# Patient Record
Sex: Male | Born: 1950 | Race: White | Hispanic: No | Marital: Married | Smoking: Former smoker
Health system: Southern US, Community
[De-identification: ages and names within clinical notes are randomized; demographics above are authoritative.]

## PROBLEM LIST (undated history)

## (undated) DIAGNOSIS — I10 Essential (primary) hypertension: Secondary | ICD-10-CM

## (undated) DIAGNOSIS — I509 Heart failure, unspecified: Secondary | ICD-10-CM

## (undated) DIAGNOSIS — M1990 Unspecified osteoarthritis, unspecified site: Secondary | ICD-10-CM

## (undated) DIAGNOSIS — J45909 Unspecified asthma, uncomplicated: Secondary | ICD-10-CM

## (undated) DIAGNOSIS — E669 Obesity, unspecified: Secondary | ICD-10-CM

## (undated) DIAGNOSIS — J449 Chronic obstructive pulmonary disease, unspecified: Secondary | ICD-10-CM

## (undated) DIAGNOSIS — F419 Anxiety disorder, unspecified: Secondary | ICD-10-CM

## (undated) DIAGNOSIS — R0600 Dyspnea, unspecified: Secondary | ICD-10-CM

## (undated) HISTORY — DX: Obesity, unspecified: E66.9

## (undated) HISTORY — DX: Unspecified osteoarthritis, unspecified site: M19.90

## (undated) HISTORY — PX: NASAL SINUS SURGERY: SHX719

---
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
## 1972-05-03 HISTORY — PX: APPENDECTOMY: SHX54

## 1972-05-03 HISTORY — PX: CHOLECYSTECTOMY: SHX55

## 1977-05-03 HISTORY — PX: VASECTOMY: SHX75

## 2004-10-17 ENCOUNTER — Emergency Department: Payer: Self-pay | Admitting: General Practice

## 2006-03-31 DIAGNOSIS — K2100 Gastro-esophageal reflux disease with esophagitis, without bleeding: Secondary | ICD-10-CM | POA: Insufficient documentation

## 2006-03-31 DIAGNOSIS — E876 Hypokalemia: Secondary | ICD-10-CM | POA: Insufficient documentation

## 2006-03-31 DIAGNOSIS — R609 Edema, unspecified: Secondary | ICD-10-CM | POA: Insufficient documentation

## 2006-03-31 DIAGNOSIS — M1710 Unilateral primary osteoarthritis, unspecified knee: Secondary | ICD-10-CM | POA: Insufficient documentation

## 2006-03-31 DIAGNOSIS — E669 Obesity, unspecified: Secondary | ICD-10-CM | POA: Insufficient documentation

## 2008-03-26 ENCOUNTER — Ambulatory Visit: Payer: Self-pay | Admitting: Family Medicine

## 2011-05-17 DIAGNOSIS — J129 Viral pneumonia, unspecified: Secondary | ICD-10-CM | POA: Diagnosis not present

## 2011-05-17 DIAGNOSIS — J441 Chronic obstructive pulmonary disease with (acute) exacerbation: Secondary | ICD-10-CM | POA: Diagnosis not present

## 2011-05-17 DIAGNOSIS — J9600 Acute respiratory failure, unspecified whether with hypoxia or hypercapnia: Secondary | ICD-10-CM | POA: Diagnosis not present

## 2011-06-29 DIAGNOSIS — J449 Chronic obstructive pulmonary disease, unspecified: Secondary | ICD-10-CM | POA: Diagnosis not present

## 2011-06-29 DIAGNOSIS — G4733 Obstructive sleep apnea (adult) (pediatric): Secondary | ICD-10-CM | POA: Diagnosis not present

## 2011-06-29 DIAGNOSIS — J45902 Unspecified asthma with status asthmaticus: Secondary | ICD-10-CM | POA: Diagnosis not present

## 2011-06-29 DIAGNOSIS — R0901 Asphyxia: Secondary | ICD-10-CM | POA: Diagnosis not present

## 2011-11-02 DIAGNOSIS — E669 Obesity, unspecified: Secondary | ICD-10-CM | POA: Diagnosis not present

## 2011-11-02 DIAGNOSIS — J449 Chronic obstructive pulmonary disease, unspecified: Secondary | ICD-10-CM | POA: Diagnosis not present

## 2011-11-02 DIAGNOSIS — R609 Edema, unspecified: Secondary | ICD-10-CM | POA: Diagnosis not present

## 2011-11-02 DIAGNOSIS — K2100 Gastro-esophageal reflux disease with esophagitis, without bleeding: Secondary | ICD-10-CM | POA: Diagnosis not present

## 2011-11-02 DIAGNOSIS — R0901 Asphyxia: Secondary | ICD-10-CM | POA: Diagnosis not present

## 2011-11-02 DIAGNOSIS — R0902 Hypoxemia: Secondary | ICD-10-CM | POA: Diagnosis not present

## 2011-12-27 DIAGNOSIS — D72829 Elevated white blood cell count, unspecified: Secondary | ICD-10-CM | POA: Diagnosis not present

## 2011-12-27 DIAGNOSIS — J45902 Unspecified asthma with status asthmaticus: Secondary | ICD-10-CM | POA: Diagnosis not present

## 2011-12-27 DIAGNOSIS — J449 Chronic obstructive pulmonary disease, unspecified: Secondary | ICD-10-CM | POA: Diagnosis not present

## 2011-12-27 DIAGNOSIS — K2100 Gastro-esophageal reflux disease with esophagitis, without bleeding: Secondary | ICD-10-CM | POA: Diagnosis not present

## 2011-12-27 DIAGNOSIS — R609 Edema, unspecified: Secondary | ICD-10-CM | POA: Diagnosis not present

## 2011-12-29 DIAGNOSIS — D72829 Elevated white blood cell count, unspecified: Secondary | ICD-10-CM | POA: Diagnosis not present

## 2012-05-13 ENCOUNTER — Inpatient Hospital Stay: Payer: Self-pay | Admitting: Internal Medicine

## 2012-05-13 DIAGNOSIS — I2510 Atherosclerotic heart disease of native coronary artery without angina pectoris: Secondary | ICD-10-CM | POA: Diagnosis present

## 2012-05-13 DIAGNOSIS — J129 Viral pneumonia, unspecified: Secondary | ICD-10-CM | POA: Diagnosis not present

## 2012-05-13 DIAGNOSIS — J9620 Acute and chronic respiratory failure, unspecified whether with hypoxia or hypercapnia: Secondary | ICD-10-CM | POA: Diagnosis not present

## 2012-05-13 DIAGNOSIS — Z833 Family history of diabetes mellitus: Secondary | ICD-10-CM | POA: Diagnosis not present

## 2012-05-13 DIAGNOSIS — R6889 Other general symptoms and signs: Secondary | ICD-10-CM | POA: Diagnosis not present

## 2012-05-13 DIAGNOSIS — J45901 Unspecified asthma with (acute) exacerbation: Secondary | ICD-10-CM | POA: Diagnosis not present

## 2012-05-13 DIAGNOSIS — I5032 Chronic diastolic (congestive) heart failure: Secondary | ICD-10-CM | POA: Diagnosis not present

## 2012-05-13 DIAGNOSIS — Z9089 Acquired absence of other organs: Secondary | ICD-10-CM | POA: Diagnosis not present

## 2012-05-13 DIAGNOSIS — E119 Type 2 diabetes mellitus without complications: Secondary | ICD-10-CM | POA: Diagnosis present

## 2012-05-13 DIAGNOSIS — Z9981 Dependence on supplemental oxygen: Secondary | ICD-10-CM | POA: Diagnosis not present

## 2012-05-13 DIAGNOSIS — J9600 Acute respiratory failure, unspecified whether with hypoxia or hypercapnia: Secondary | ICD-10-CM | POA: Diagnosis not present

## 2012-05-13 DIAGNOSIS — R0902 Hypoxemia: Secondary | ICD-10-CM | POA: Diagnosis not present

## 2012-05-13 DIAGNOSIS — J1100 Influenza due to unidentified influenza virus with unspecified type of pneumonia: Secondary | ICD-10-CM | POA: Diagnosis not present

## 2012-05-13 DIAGNOSIS — R918 Other nonspecific abnormal finding of lung field: Secondary | ICD-10-CM | POA: Diagnosis not present

## 2012-05-13 DIAGNOSIS — I5031 Acute diastolic (congestive) heart failure: Secondary | ICD-10-CM | POA: Diagnosis not present

## 2012-05-13 DIAGNOSIS — J189 Pneumonia, unspecified organism: Secondary | ICD-10-CM | POA: Diagnosis not present

## 2012-05-13 DIAGNOSIS — Z8249 Family history of ischemic heart disease and other diseases of the circulatory system: Secondary | ICD-10-CM | POA: Diagnosis not present

## 2012-05-13 DIAGNOSIS — G4733 Obstructive sleep apnea (adult) (pediatric): Secondary | ICD-10-CM | POA: Diagnosis present

## 2012-05-13 DIAGNOSIS — I5022 Chronic systolic (congestive) heart failure: Secondary | ICD-10-CM | POA: Diagnosis not present

## 2012-05-13 DIAGNOSIS — E875 Hyperkalemia: Secondary | ICD-10-CM | POA: Diagnosis present

## 2012-05-13 DIAGNOSIS — R0602 Shortness of breath: Secondary | ICD-10-CM | POA: Diagnosis not present

## 2012-05-13 DIAGNOSIS — I509 Heart failure, unspecified: Secondary | ICD-10-CM | POA: Diagnosis present

## 2012-05-13 DIAGNOSIS — G4730 Sleep apnea, unspecified: Secondary | ICD-10-CM | POA: Diagnosis not present

## 2012-05-13 DIAGNOSIS — Z886 Allergy status to analgesic agent status: Secondary | ICD-10-CM | POA: Diagnosis not present

## 2012-05-13 DIAGNOSIS — J441 Chronic obstructive pulmonary disease with (acute) exacerbation: Secondary | ICD-10-CM | POA: Diagnosis not present

## 2012-05-13 DIAGNOSIS — E871 Hypo-osmolality and hyponatremia: Secondary | ICD-10-CM | POA: Diagnosis not present

## 2012-05-13 DIAGNOSIS — Z6841 Body Mass Index (BMI) 40.0 and over, adult: Secondary | ICD-10-CM | POA: Diagnosis not present

## 2012-05-13 LAB — CBC WITH DIFFERENTIAL/PLATELET
Eosinophil #: 0 10*3/uL (ref 0.0–0.7)
Eosinophil %: 0 %
HCT: 39.6 % — ABNORMAL LOW (ref 40.0–52.0)
Lymphocyte %: 1.9 %
MCH: 29.8 pg (ref 26.0–34.0)
Monocyte #: 0.9 x10 3/mm (ref 0.2–1.0)
Monocyte %: 5.4 %
Neutrophil #: 14.6 10*3/uL — ABNORMAL HIGH (ref 1.4–6.5)
Neutrophil %: 92.6 %
Platelet: 183 10*3/uL (ref 150–440)
RBC: 4.28 10*6/uL — ABNORMAL LOW (ref 4.40–5.90)
WBC: 15.7 10*3/uL — ABNORMAL HIGH (ref 3.8–10.6)

## 2012-05-13 LAB — COMPREHENSIVE METABOLIC PANEL
Anion Gap: 5 — ABNORMAL LOW (ref 7–16)
Calcium, Total: 8.6 mg/dL (ref 8.5–10.1)
Co2: 34 mmol/L — ABNORMAL HIGH (ref 21–32)
Creatinine: 0.65 mg/dL (ref 0.60–1.30)
EGFR (African American): >60
EGFR (Non-African Amer.): >60
Glucose: 124 mg/dL — ABNORMAL HIGH (ref 65–99)
Osmolality: 263 (ref 275–301)
SGOT(AST): 55 U/L — ABNORMAL HIGH (ref 15–37)
SGPT (ALT): 38 U/L (ref 12–78)
Sodium: 130 mmol/L — ABNORMAL LOW (ref 136–145)

## 2012-05-13 LAB — PRO B NATRIURETIC PEPTIDE: B-Type Natriuretic Peptide: 29 pg/mL (ref 0–125)

## 2012-05-13 LAB — TROPONIN I: Troponin-I: <0.02 ng/mL

## 2012-05-14 LAB — URINALYSIS, COMPLETE
Bacteria: NONE SEEN
Bilirubin,UR: NEGATIVE
Glucose,UR: NEGATIVE mg/dL (ref 0–75)
Leukocyte Esterase: NEGATIVE
Nitrite: NEGATIVE
Ph: 6 (ref 4.5–8.0)
RBC,UR: 1 /HPF (ref 0–5)
Specific Gravity: 1.01 (ref 1.003–1.030)
Squamous Epithelial: <1
WBC UR: <1 /HPF (ref 0–5)

## 2012-05-14 LAB — CBC WITH DIFFERENTIAL/PLATELET
Basophil %: 0.1 %
Eosinophil #: 0 10*3/uL (ref 0.0–0.7)
Eosinophil %: 0 %
HCT: 39.1 % — ABNORMAL LOW (ref 40.0–52.0)
HGB: 12.7 g/dL — ABNORMAL LOW (ref 13.0–18.0)
Lymphocyte %: 2.7 %
MCH: 30 pg (ref 26.0–34.0)
MCV: 93 fL (ref 80–100)
Monocyte #: 0.3 x10 3/mm (ref 0.2–1.0)
Monocyte %: 3 %
Neutrophil #: 10.8 10*3/uL — ABNORMAL HIGH (ref 1.4–6.5)
Neutrophil %: 94.2 %
Platelet: 190 10*3/uL (ref 150–440)
RDW: 13.6 % (ref 11.5–14.5)
WBC: 11.4 10*3/uL — ABNORMAL HIGH (ref 3.8–10.6)

## 2012-05-14 LAB — TROPONIN I: Troponin-I: <0.02 ng/mL

## 2012-05-14 LAB — BASIC METABOLIC PANEL
Chloride: 90 mmol/L — ABNORMAL LOW (ref 98–107)
Co2: 33 mmol/L — ABNORMAL HIGH (ref 21–32)
Creatinine: 0.61 mg/dL (ref 0.60–1.30)
Glucose: 150 mg/dL — ABNORMAL HIGH (ref 65–99)
Osmolality: 262 (ref 275–301)
Sodium: 128 mmol/L — ABNORMAL LOW (ref 136–145)

## 2012-05-15 LAB — BASIC METABOLIC PANEL
BUN: 17 mg/dL (ref 7–18)
Calcium, Total: 9.2 mg/dL (ref 8.5–10.1)
Co2: 41 mmol/L (ref 21–32)
Creatinine: 0.65 mg/dL (ref 0.60–1.30)
EGFR (African American): >60
Potassium: 5.4 mmol/L — ABNORMAL HIGH (ref 3.5–5.1)
Sodium: 134 mmol/L — ABNORMAL LOW (ref 136–145)

## 2012-05-16 LAB — BASIC METABOLIC PANEL
Anion Gap: 1 — ABNORMAL LOW (ref 7–16)
BUN: 17 mg/dL (ref 7–18)
Calcium, Total: 9 mg/dL (ref 8.5–10.1)
Chloride: 93 mmol/L — ABNORMAL LOW (ref 98–107)
Co2: 41 mmol/L (ref 21–32)
Creatinine: 0.58 mg/dL — ABNORMAL LOW (ref 0.60–1.30)
EGFR (Non-African Amer.): >60
Potassium: 5.1 mmol/L (ref 3.5–5.1)

## 2012-05-18 LAB — BASIC METABOLIC PANEL
Anion Gap: 4 — ABNORMAL LOW (ref 7–16)
Calcium, Total: 8.8 mg/dL (ref 8.5–10.1)
Chloride: 98 mmol/L (ref 98–107)
Co2: 37 mmol/L — ABNORMAL HIGH (ref 21–32)
Creatinine: 0.69 mg/dL (ref 0.60–1.30)
EGFR (Non-African Amer.): >60
Osmolality: 284 (ref 275–301)
Potassium: 4.9 mmol/L (ref 3.5–5.1)
Sodium: 139 mmol/L (ref 136–145)

## 2012-05-18 LAB — PLATELET COUNT: Platelet: 188 10*3/uL (ref 150–440)

## 2012-05-19 LAB — CULTURE, BLOOD (SINGLE)

## 2012-05-22 DIAGNOSIS — J189 Pneumonia, unspecified organism: Secondary | ICD-10-CM | POA: Diagnosis not present

## 2012-05-22 DIAGNOSIS — J441 Chronic obstructive pulmonary disease with (acute) exacerbation: Secondary | ICD-10-CM | POA: Diagnosis not present

## 2012-05-22 DIAGNOSIS — G4733 Obstructive sleep apnea (adult) (pediatric): Secondary | ICD-10-CM | POA: Diagnosis not present

## 2012-05-22 DIAGNOSIS — I509 Heart failure, unspecified: Secondary | ICD-10-CM | POA: Diagnosis not present

## 2012-05-22 DIAGNOSIS — Z9981 Dependence on supplemental oxygen: Secondary | ICD-10-CM | POA: Diagnosis not present

## 2012-05-24 DIAGNOSIS — Z9981 Dependence on supplemental oxygen: Secondary | ICD-10-CM | POA: Diagnosis not present

## 2012-05-24 DIAGNOSIS — J441 Chronic obstructive pulmonary disease with (acute) exacerbation: Secondary | ICD-10-CM | POA: Diagnosis not present

## 2012-05-24 DIAGNOSIS — I509 Heart failure, unspecified: Secondary | ICD-10-CM | POA: Diagnosis not present

## 2012-05-24 DIAGNOSIS — G4733 Obstructive sleep apnea (adult) (pediatric): Secondary | ICD-10-CM | POA: Diagnosis not present

## 2012-05-24 DIAGNOSIS — J189 Pneumonia, unspecified organism: Secondary | ICD-10-CM | POA: Diagnosis not present

## 2012-05-29 DIAGNOSIS — R0902 Hypoxemia: Secondary | ICD-10-CM | POA: Diagnosis not present

## 2012-05-29 DIAGNOSIS — J45902 Unspecified asthma with status asthmaticus: Secondary | ICD-10-CM | POA: Diagnosis not present

## 2012-05-29 DIAGNOSIS — J189 Pneumonia, unspecified organism: Secondary | ICD-10-CM | POA: Diagnosis not present

## 2012-05-29 DIAGNOSIS — J449 Chronic obstructive pulmonary disease, unspecified: Secondary | ICD-10-CM | POA: Diagnosis not present

## 2012-05-30 DIAGNOSIS — G4733 Obstructive sleep apnea (adult) (pediatric): Secondary | ICD-10-CM | POA: Diagnosis not present

## 2012-05-30 DIAGNOSIS — J441 Chronic obstructive pulmonary disease with (acute) exacerbation: Secondary | ICD-10-CM | POA: Diagnosis not present

## 2012-05-30 DIAGNOSIS — I509 Heart failure, unspecified: Secondary | ICD-10-CM | POA: Diagnosis not present

## 2012-05-30 DIAGNOSIS — J189 Pneumonia, unspecified organism: Secondary | ICD-10-CM | POA: Diagnosis not present

## 2012-05-31 DIAGNOSIS — I509 Heart failure, unspecified: Secondary | ICD-10-CM | POA: Diagnosis not present

## 2012-05-31 DIAGNOSIS — Z9981 Dependence on supplemental oxygen: Secondary | ICD-10-CM | POA: Diagnosis not present

## 2012-05-31 DIAGNOSIS — J441 Chronic obstructive pulmonary disease with (acute) exacerbation: Secondary | ICD-10-CM | POA: Diagnosis not present

## 2012-05-31 DIAGNOSIS — G4733 Obstructive sleep apnea (adult) (pediatric): Secondary | ICD-10-CM | POA: Diagnosis not present

## 2012-05-31 DIAGNOSIS — J189 Pneumonia, unspecified organism: Secondary | ICD-10-CM | POA: Diagnosis not present

## 2012-06-07 DIAGNOSIS — J441 Chronic obstructive pulmonary disease with (acute) exacerbation: Secondary | ICD-10-CM | POA: Diagnosis not present

## 2012-06-07 DIAGNOSIS — Z9981 Dependence on supplemental oxygen: Secondary | ICD-10-CM | POA: Diagnosis not present

## 2012-06-07 DIAGNOSIS — J189 Pneumonia, unspecified organism: Secondary | ICD-10-CM | POA: Diagnosis not present

## 2012-06-07 DIAGNOSIS — I509 Heart failure, unspecified: Secondary | ICD-10-CM | POA: Diagnosis not present

## 2012-06-07 DIAGNOSIS — G4733 Obstructive sleep apnea (adult) (pediatric): Secondary | ICD-10-CM | POA: Diagnosis not present

## 2012-06-09 DIAGNOSIS — J189 Pneumonia, unspecified organism: Secondary | ICD-10-CM | POA: Diagnosis not present

## 2012-06-09 DIAGNOSIS — R5381 Other malaise: Secondary | ICD-10-CM | POA: Diagnosis not present

## 2012-06-09 DIAGNOSIS — R5383 Other fatigue: Secondary | ICD-10-CM | POA: Diagnosis not present

## 2012-06-09 DIAGNOSIS — J449 Chronic obstructive pulmonary disease, unspecified: Secondary | ICD-10-CM | POA: Diagnosis not present

## 2012-06-09 DIAGNOSIS — I509 Heart failure, unspecified: Secondary | ICD-10-CM | POA: Diagnosis not present

## 2012-06-19 DIAGNOSIS — J449 Chronic obstructive pulmonary disease, unspecified: Secondary | ICD-10-CM | POA: Diagnosis not present

## 2012-06-19 DIAGNOSIS — J189 Pneumonia, unspecified organism: Secondary | ICD-10-CM | POA: Diagnosis not present

## 2012-06-19 DIAGNOSIS — G4710 Hypersomnia, unspecified: Secondary | ICD-10-CM | POA: Diagnosis not present

## 2012-06-19 DIAGNOSIS — R0602 Shortness of breath: Secondary | ICD-10-CM | POA: Diagnosis not present

## 2012-06-21 DIAGNOSIS — G4733 Obstructive sleep apnea (adult) (pediatric): Secondary | ICD-10-CM | POA: Diagnosis not present

## 2012-06-22 ENCOUNTER — Ambulatory Visit: Payer: Self-pay | Admitting: Internal Medicine

## 2012-06-22 DIAGNOSIS — R0602 Shortness of breath: Secondary | ICD-10-CM | POA: Diagnosis not present

## 2012-06-22 DIAGNOSIS — R918 Other nonspecific abnormal finding of lung field: Secondary | ICD-10-CM | POA: Diagnosis not present

## 2012-06-27 DIAGNOSIS — G4710 Hypersomnia, unspecified: Secondary | ICD-10-CM | POA: Diagnosis not present

## 2012-07-05 DIAGNOSIS — I509 Heart failure, unspecified: Secondary | ICD-10-CM | POA: Diagnosis not present

## 2012-07-19 DIAGNOSIS — I509 Heart failure, unspecified: Secondary | ICD-10-CM | POA: Diagnosis not present

## 2012-07-19 DIAGNOSIS — G4733 Obstructive sleep apnea (adult) (pediatric): Secondary | ICD-10-CM | POA: Diagnosis not present

## 2012-07-31 ENCOUNTER — Ambulatory Visit: Payer: Self-pay | Admitting: Internal Medicine

## 2012-07-31 DIAGNOSIS — Z9981 Dependence on supplemental oxygen: Secondary | ICD-10-CM | POA: Diagnosis not present

## 2012-07-31 DIAGNOSIS — M542 Cervicalgia: Secondary | ICD-10-CM | POA: Diagnosis not present

## 2012-07-31 DIAGNOSIS — J42 Unspecified chronic bronchitis: Secondary | ICD-10-CM | POA: Diagnosis not present

## 2012-07-31 DIAGNOSIS — R918 Other nonspecific abnormal finding of lung field: Secondary | ICD-10-CM | POA: Diagnosis not present

## 2012-07-31 DIAGNOSIS — J189 Pneumonia, unspecified organism: Secondary | ICD-10-CM | POA: Diagnosis not present

## 2012-07-31 DIAGNOSIS — R0602 Shortness of breath: Secondary | ICD-10-CM | POA: Diagnosis not present

## 2012-07-31 DIAGNOSIS — J449 Chronic obstructive pulmonary disease, unspecified: Secondary | ICD-10-CM | POA: Diagnosis not present

## 2012-07-31 DIAGNOSIS — G4720 Circadian rhythm sleep disorder, unspecified type: Secondary | ICD-10-CM | POA: Diagnosis not present

## 2012-07-31 DIAGNOSIS — J984 Other disorders of lung: Secondary | ICD-10-CM | POA: Diagnosis not present

## 2012-07-31 DIAGNOSIS — M5030 Other cervical disc degeneration, unspecified cervical region: Secondary | ICD-10-CM | POA: Diagnosis not present

## 2012-08-04 DIAGNOSIS — R0602 Shortness of breath: Secondary | ICD-10-CM | POA: Diagnosis not present

## 2012-08-09 DIAGNOSIS — R0602 Shortness of breath: Secondary | ICD-10-CM | POA: Diagnosis not present

## 2012-08-21 DIAGNOSIS — R0602 Shortness of breath: Secondary | ICD-10-CM | POA: Diagnosis not present

## 2012-08-21 DIAGNOSIS — J449 Chronic obstructive pulmonary disease, unspecified: Secondary | ICD-10-CM | POA: Diagnosis not present

## 2012-08-21 DIAGNOSIS — Z9981 Dependence on supplemental oxygen: Secondary | ICD-10-CM | POA: Diagnosis not present

## 2012-08-21 DIAGNOSIS — G4720 Circadian rhythm sleep disorder, unspecified type: Secondary | ICD-10-CM | POA: Diagnosis not present

## 2012-08-21 DIAGNOSIS — I517 Cardiomegaly: Secondary | ICD-10-CM | POA: Diagnosis not present

## 2012-10-02 DIAGNOSIS — R0602 Shortness of breath: Secondary | ICD-10-CM | POA: Diagnosis not present

## 2012-10-02 DIAGNOSIS — Z006 Encounter for examination for normal comparison and control in clinical research program: Secondary | ICD-10-CM | POA: Diagnosis not present

## 2012-10-02 DIAGNOSIS — J449 Chronic obstructive pulmonary disease, unspecified: Secondary | ICD-10-CM | POA: Diagnosis not present

## 2012-10-02 DIAGNOSIS — G4730 Sleep apnea, unspecified: Secondary | ICD-10-CM | POA: Diagnosis not present

## 2012-10-02 DIAGNOSIS — J438 Other emphysema: Secondary | ICD-10-CM | POA: Diagnosis not present

## 2012-10-24 DIAGNOSIS — R209 Unspecified disturbances of skin sensation: Secondary | ICD-10-CM | POA: Diagnosis not present

## 2012-10-24 DIAGNOSIS — J449 Chronic obstructive pulmonary disease, unspecified: Secondary | ICD-10-CM | POA: Diagnosis not present

## 2012-10-24 DIAGNOSIS — I509 Heart failure, unspecified: Secondary | ICD-10-CM | POA: Diagnosis not present

## 2012-10-24 DIAGNOSIS — M47812 Spondylosis without myelopathy or radiculopathy, cervical region: Secondary | ICD-10-CM | POA: Diagnosis not present

## 2012-10-24 DIAGNOSIS — J45902 Unspecified asthma with status asthmaticus: Secondary | ICD-10-CM | POA: Diagnosis not present

## 2012-11-17 DIAGNOSIS — R209 Unspecified disturbances of skin sensation: Secondary | ICD-10-CM | POA: Diagnosis not present

## 2012-11-17 DIAGNOSIS — G5620 Lesion of ulnar nerve, unspecified upper limb: Secondary | ICD-10-CM | POA: Diagnosis not present

## 2012-12-04 DIAGNOSIS — G5600 Carpal tunnel syndrome, unspecified upper limb: Secondary | ICD-10-CM | POA: Diagnosis not present

## 2013-01-04 DIAGNOSIS — R209 Unspecified disturbances of skin sensation: Secondary | ICD-10-CM | POA: Diagnosis not present

## 2013-01-04 DIAGNOSIS — M47812 Spondylosis without myelopathy or radiculopathy, cervical region: Secondary | ICD-10-CM | POA: Diagnosis not present

## 2013-01-04 DIAGNOSIS — G4733 Obstructive sleep apnea (adult) (pediatric): Secondary | ICD-10-CM | POA: Diagnosis not present

## 2013-02-01 DIAGNOSIS — J438 Other emphysema: Secondary | ICD-10-CM | POA: Diagnosis not present

## 2013-02-01 DIAGNOSIS — R0602 Shortness of breath: Secondary | ICD-10-CM | POA: Diagnosis not present

## 2013-02-01 DIAGNOSIS — J449 Chronic obstructive pulmonary disease, unspecified: Secondary | ICD-10-CM | POA: Diagnosis not present

## 2013-02-01 IMAGING — CR DG CHEST 1V PORT
1 series · 2 of 2 positions shown · non-contrast
Comparison: none

REASON FOR EXAM: sob
COMMENTS:   LMP: (Male)

[Series 1: ap · 0.17mm/px · 2 of 2 slices shown]
[im 1/2]
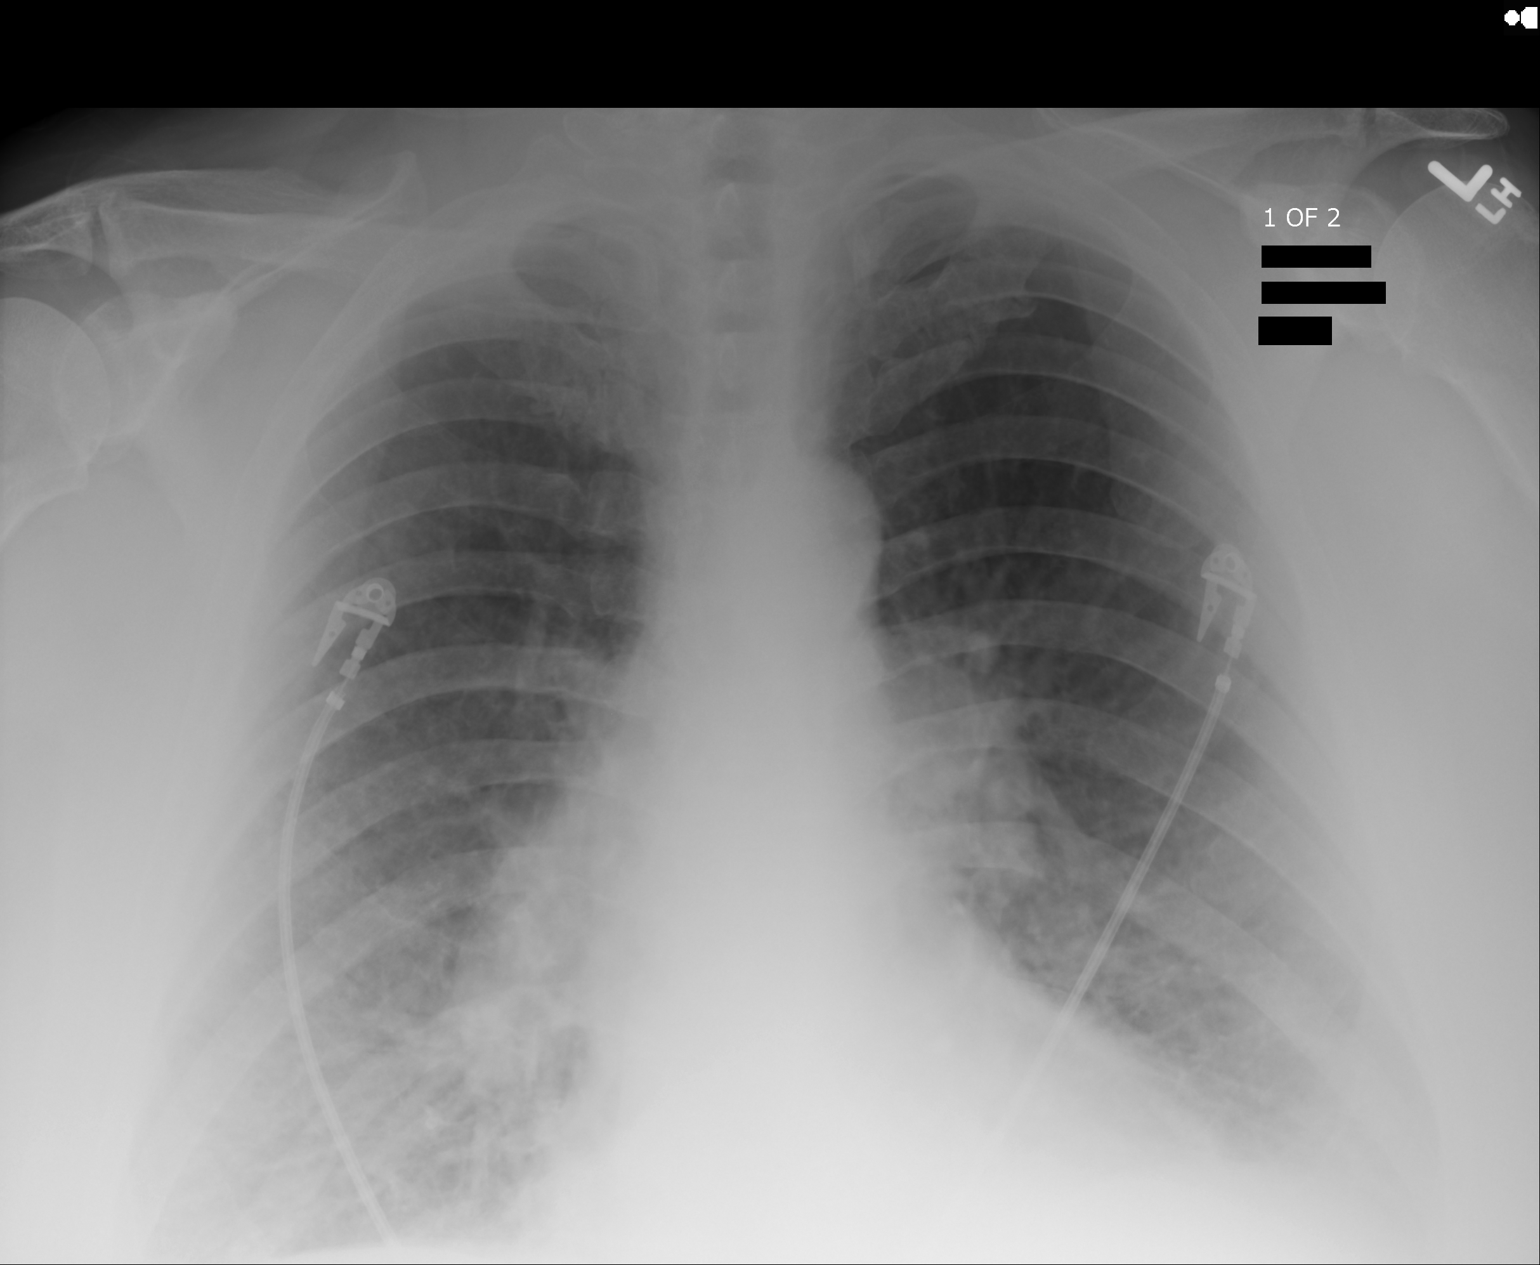
[im 2/2]
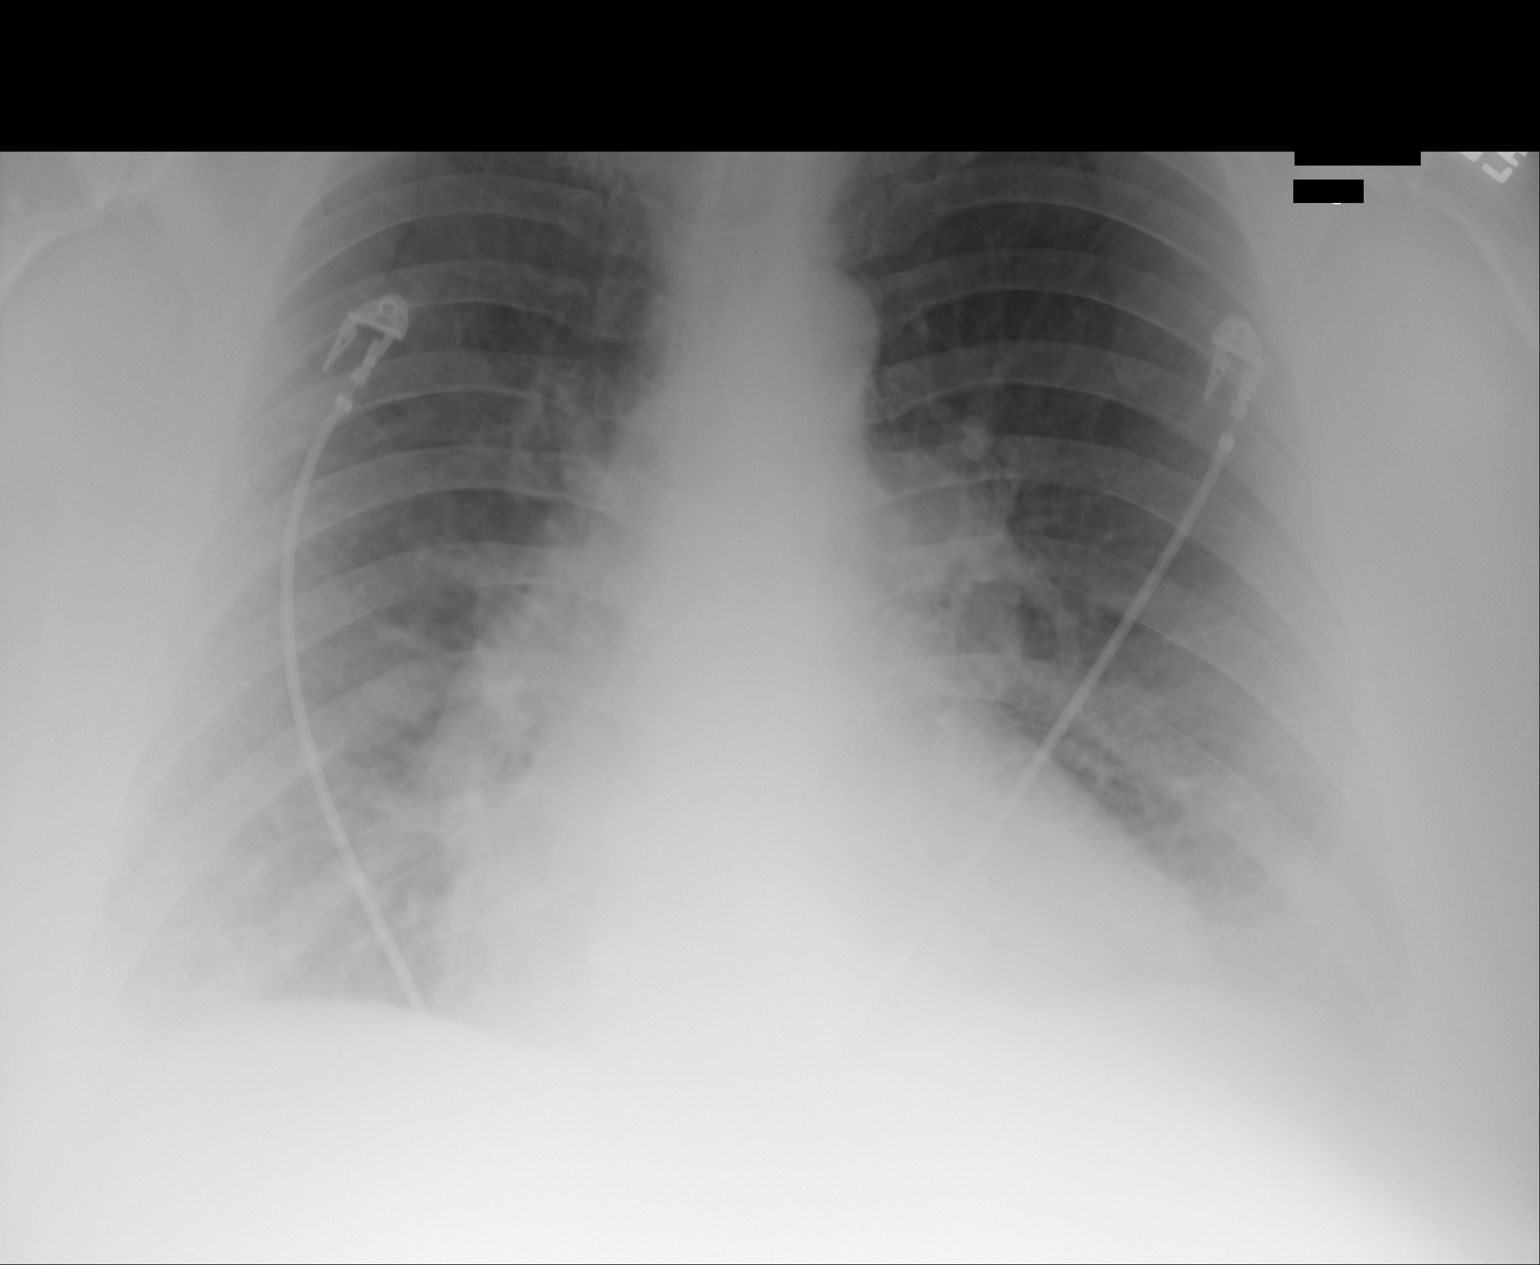

[2 of 2 positions shown; findings below may reference images not displayed]

PROCEDURE:     DXR - DXR PORTABLE CHEST SINGLE VIEW  - May 13, 2012  [DATE]

RESULT:     The lungs are reasonably well inflated. There is bilateral hilar
prominence. The perihilar interstitial markings are increased. The left
hemidiaphragm is partially obscured. The cardiac silhouette is enlarged. The
pulmonary vascularity is not clearly cephalized.
IMPRESSION: The findings suggest COPD with interstitial edema which may
be of cardiac or noncardiac cause. There is no focal pneumonia. A followup
PA and lateral chest x-ray would be of value.

[REDACTED]

## 2013-03-28 DIAGNOSIS — R0602 Shortness of breath: Secondary | ICD-10-CM | POA: Diagnosis not present

## 2013-04-02 DIAGNOSIS — J449 Chronic obstructive pulmonary disease, unspecified: Secondary | ICD-10-CM | POA: Diagnosis not present

## 2013-04-02 DIAGNOSIS — J438 Other emphysema: Secondary | ICD-10-CM | POA: Diagnosis not present

## 2013-05-04 ENCOUNTER — Inpatient Hospital Stay: Payer: Self-pay | Admitting: Specialist

## 2013-05-04 DIAGNOSIS — E662 Morbid (severe) obesity with alveolar hypoventilation: Secondary | ICD-10-CM | POA: Diagnosis not present

## 2013-05-04 DIAGNOSIS — R748 Abnormal levels of other serum enzymes: Secondary | ICD-10-CM | POA: Diagnosis not present

## 2013-05-04 DIAGNOSIS — R0609 Other forms of dyspnea: Secondary | ICD-10-CM | POA: Diagnosis not present

## 2013-05-04 DIAGNOSIS — Z6841 Body Mass Index (BMI) 40.0 and over, adult: Secondary | ICD-10-CM | POA: Diagnosis not present

## 2013-05-04 DIAGNOSIS — I5030 Unspecified diastolic (congestive) heart failure: Secondary | ICD-10-CM | POA: Diagnosis not present

## 2013-05-04 DIAGNOSIS — J441 Chronic obstructive pulmonary disease with (acute) exacerbation: Secondary | ICD-10-CM | POA: Diagnosis not present

## 2013-05-04 DIAGNOSIS — G4733 Obstructive sleep apnea (adult) (pediatric): Secondary | ICD-10-CM | POA: Diagnosis not present

## 2013-05-04 DIAGNOSIS — G4710 Hypersomnia, unspecified: Secondary | ICD-10-CM | POA: Diagnosis not present

## 2013-05-04 DIAGNOSIS — J9600 Acute respiratory failure, unspecified whether with hypoxia or hypercapnia: Secondary | ICD-10-CM | POA: Diagnosis not present

## 2013-05-04 DIAGNOSIS — R142 Eructation: Secondary | ICD-10-CM | POA: Diagnosis not present

## 2013-05-04 DIAGNOSIS — I5032 Chronic diastolic (congestive) heart failure: Secondary | ICD-10-CM | POA: Diagnosis present

## 2013-05-04 DIAGNOSIS — J189 Pneumonia, unspecified organism: Secondary | ICD-10-CM | POA: Diagnosis not present

## 2013-05-04 DIAGNOSIS — R918 Other nonspecific abnormal finding of lung field: Secondary | ICD-10-CM | POA: Diagnosis not present

## 2013-05-04 DIAGNOSIS — D72829 Elevated white blood cell count, unspecified: Secondary | ICD-10-CM | POA: Diagnosis not present

## 2013-05-04 DIAGNOSIS — R141 Gas pain: Secondary | ICD-10-CM | POA: Diagnosis not present

## 2013-05-04 DIAGNOSIS — Z4682 Encounter for fitting and adjustment of non-vascular catheter: Secondary | ICD-10-CM | POA: Diagnosis not present

## 2013-05-04 DIAGNOSIS — I248 Other forms of acute ischemic heart disease: Secondary | ICD-10-CM | POA: Diagnosis present

## 2013-05-04 DIAGNOSIS — J449 Chronic obstructive pulmonary disease, unspecified: Secondary | ICD-10-CM | POA: Diagnosis not present

## 2013-05-04 DIAGNOSIS — R0602 Shortness of breath: Secondary | ICD-10-CM | POA: Diagnosis not present

## 2013-05-04 DIAGNOSIS — J440 Chronic obstructive pulmonary disease with acute lower respiratory infection: Secondary | ICD-10-CM | POA: Diagnosis present

## 2013-05-04 DIAGNOSIS — R143 Flatulence: Secondary | ICD-10-CM | POA: Diagnosis not present

## 2013-05-04 DIAGNOSIS — Z9981 Dependence on supplemental oxygen: Secondary | ICD-10-CM | POA: Diagnosis not present

## 2013-05-04 DIAGNOSIS — Z87891 Personal history of nicotine dependence: Secondary | ICD-10-CM | POA: Diagnosis not present

## 2013-05-04 DIAGNOSIS — R110 Nausea: Secondary | ICD-10-CM | POA: Diagnosis not present

## 2013-05-04 DIAGNOSIS — M6281 Muscle weakness (generalized): Secondary | ICD-10-CM | POA: Diagnosis not present

## 2013-05-04 DIAGNOSIS — G4730 Sleep apnea, unspecified: Secondary | ICD-10-CM | POA: Diagnosis present

## 2013-05-04 DIAGNOSIS — I509 Heart failure, unspecified: Secondary | ICD-10-CM | POA: Diagnosis not present

## 2013-05-04 DIAGNOSIS — J4489 Other specified chronic obstructive pulmonary disease: Secondary | ICD-10-CM | POA: Diagnosis not present

## 2013-05-04 DIAGNOSIS — J209 Acute bronchitis, unspecified: Secondary | ICD-10-CM | POA: Diagnosis not present

## 2013-05-04 DIAGNOSIS — J9620 Acute and chronic respiratory failure, unspecified whether with hypoxia or hypercapnia: Secondary | ICD-10-CM | POA: Diagnosis not present

## 2013-05-04 LAB — COMPREHENSIVE METABOLIC PANEL
ALK PHOS: 84 U/L
Albumin: 3.4 g/dL (ref 3.4–5.0)
Anion Gap: 1 — ABNORMAL LOW (ref 7–16)
BUN: 12 mg/dL (ref 7–18)
Bilirubin,Total: 0.2 mg/dL (ref 0.2–1.0)
CREATININE: 0.74 mg/dL (ref 0.60–1.30)
Calcium, Total: 8.4 mg/dL — ABNORMAL LOW (ref 8.5–10.1)
Chloride: 95 mmol/L — ABNORMAL LOW (ref 98–107)
Co2: 38 mmol/L — ABNORMAL HIGH (ref 21–32)
EGFR (African American): >60
EGFR (Non-African Amer.): >60
GLUCOSE: 171 mg/dL — AB (ref 65–99)
Osmolality: 272 (ref 275–301)
Potassium: 4.3 mmol/L (ref 3.5–5.1)
SGOT(AST): 39 U/L — ABNORMAL HIGH (ref 15–37)
SGPT (ALT): 30 U/L (ref 12–78)
Sodium: 134 mmol/L — ABNORMAL LOW (ref 136–145)
TOTAL PROTEIN: 7.5 g/dL (ref 6.4–8.2)

## 2013-05-04 LAB — URINALYSIS, COMPLETE
BACTERIA: NONE SEEN
Bilirubin,UR: NEGATIVE
Blood: NEGATIVE
GLUCOSE, UR: NEGATIVE mg/dL (ref 0–75)
Ketone: NEGATIVE
Leukocyte Esterase: NEGATIVE
Nitrite: NEGATIVE
PH: 5 (ref 4.5–8.0)
Protein: 100
RBC,UR: 3 /HPF (ref 0–5)
SPECIFIC GRAVITY: 1.014 (ref 1.003–1.030)
Squamous Epithelial: <1
WBC UR: 1 /HPF (ref 0–5)

## 2013-05-04 LAB — TROPONIN I
TROPONIN-I: 0.06 ng/mL — AB
Troponin-I: 0.07 ng/mL — ABNORMAL HIGH

## 2013-05-04 LAB — CK TOTAL AND CKMB (NOT AT ARMC)
CK, TOTAL: 143 U/L (ref 35–232)
CK, Total: 66 U/L (ref 35–232)
CK, Total: 180 U/L (ref 35–232)
CK-MB: 2 ng/mL (ref 0.5–3.6)
CK-MB: 1.9 ng/mL (ref 0.5–3.6)
CK-MB: 1.6 ng/mL (ref 0.5–3.6)

## 2013-05-04 LAB — CBC
HCT: 37 % — AB (ref 40.0–52.0)
HGB: 11.7 g/dL — AB (ref 13.0–18.0)
MCH: 28.4 pg (ref 26.0–34.0)
MCHC: 31.6 g/dL — ABNORMAL LOW (ref 32.0–36.0)
MCV: 90 fL (ref 80–100)
Platelet: 225 10*3/uL (ref 150–440)
RBC: 4.12 10*6/uL — AB (ref 4.40–5.90)
RDW: 17.1 % — ABNORMAL HIGH (ref 11.5–14.5)
WBC: 15 10*3/uL — AB (ref 3.8–10.6)

## 2013-05-04 LAB — PRO B NATRIURETIC PEPTIDE: B-Type Natriuretic Peptide: 33 pg/mL (ref 0–125)

## 2013-05-05 LAB — BASIC METABOLIC PANEL
Anion Gap: 0 — ABNORMAL LOW (ref 7–16)
BUN: 20 mg/dL — ABNORMAL HIGH (ref 7–18)
CHLORIDE: 97 mmol/L — AB (ref 98–107)
CO2: 36 mmol/L — AB (ref 21–32)
CREATININE: 0.71 mg/dL (ref 0.60–1.30)
Calcium, Total: 8.5 mg/dL (ref 8.5–10.1)
EGFR (Non-African Amer.): >60
Glucose: 152 mg/dL — ABNORMAL HIGH (ref 65–99)
OSMOLALITY: 272 (ref 275–301)
POTASSIUM: 4.6 mmol/L (ref 3.5–5.1)
Sodium: 133 mmol/L — ABNORMAL LOW (ref 136–145)

## 2013-05-05 LAB — CBC WITH DIFFERENTIAL/PLATELET
Basophil #: 0 10*3/uL (ref 0.0–0.1)
Basophil %: 0.1 %
EOS ABS: 0 10*3/uL (ref 0.0–0.7)
Eosinophil %: 0 %
HCT: 32.6 % — ABNORMAL LOW (ref 40.0–52.0)
HGB: 10.4 g/dL — AB (ref 13.0–18.0)
LYMPHS ABS: 0.4 10*3/uL — AB (ref 1.0–3.6)
Lymphocyte %: 3.5 %
MCH: 28.1 pg (ref 26.0–34.0)
MCHC: 31.8 g/dL — AB (ref 32.0–36.0)
MCV: 88 fL (ref 80–100)
Monocyte #: 1.1 x10 3/mm — ABNORMAL HIGH (ref 0.2–1.0)
Monocyte %: 9.6 %
NEUTROS ABS: 10 10*3/uL — AB (ref 1.4–6.5)
Neutrophil %: 86.8 %
Platelet: 178 10*3/uL (ref 150–440)
RBC: 3.69 10*6/uL — AB (ref 4.40–5.90)
RDW: 16.9 % — AB (ref 11.5–14.5)
WBC: 11.5 10*3/uL — ABNORMAL HIGH (ref 3.8–10.6)

## 2013-05-05 LAB — PHOSPHORUS: Phosphorus: 3.7 mg/dL (ref 2.5–4.9)

## 2013-05-05 LAB — TROPONIN I: Troponin-I: 0.05 ng/mL

## 2013-05-05 LAB — MAGNESIUM: MAGNESIUM: 2.5 mg/dL — AB

## 2013-05-06 LAB — BASIC METABOLIC PANEL
BUN: 28 mg/dL — AB (ref 7–18)
BUN: 26 mg/dL — AB (ref 7–18)
CALCIUM: 8.7 mg/dL (ref 8.5–10.1)
CHLORIDE: 99 mmol/L (ref 98–107)
CREATININE: 0.56 mg/dL — AB (ref 0.60–1.30)
Calcium, Total: 8.9 mg/dL (ref 8.5–10.1)
Chloride: 100 mmol/L (ref 98–107)
Co2: 42 mmol/L (ref 21–32)
Co2: 41 mmol/L (ref 21–32)
Creatinine: 0.64 mg/dL (ref 0.60–1.30)
EGFR (African American): >60
EGFR (African American): >60
EGFR (Non-African Amer.): >60
EGFR (Non-African Amer.): >60
GLUCOSE: 143 mg/dL — AB (ref 65–99)
Glucose: 135 mg/dL — ABNORMAL HIGH (ref 65–99)
Osmolality: 280 (ref 275–301)
Osmolality: 284 (ref 275–301)
POTASSIUM: 4.6 mmol/L (ref 3.5–5.1)
Potassium: 4.9 mmol/L (ref 3.5–5.1)
SODIUM: 139 mmol/L (ref 136–145)
Sodium: 136 mmol/L (ref 136–145)

## 2013-05-06 LAB — CBC WITH DIFFERENTIAL/PLATELET
BASOS PCT: 0.1 %
Basophil #: 0 10*3/uL (ref 0.0–0.1)
Basophil #: 0 10*3/uL (ref 0.0–0.1)
Basophil %: 0.1 %
EOS PCT: 0 %
Eosinophil #: 0 10*3/uL (ref 0.0–0.7)
Eosinophil #: 0 10*3/uL (ref 0.0–0.7)
Eosinophil %: 0 %
HCT: 34.2 % — ABNORMAL LOW (ref 40.0–52.0)
HCT: 34 % — ABNORMAL LOW (ref 40.0–52.0)
HGB: 11 g/dL — ABNORMAL LOW (ref 13.0–18.0)
HGB: 10.9 g/dL — AB (ref 13.0–18.0)
LYMPHS PCT: 4.8 %
Lymphocyte #: 0.7 10*3/uL — ABNORMAL LOW (ref 1.0–3.6)
Lymphocyte #: 0.5 10*3/uL — ABNORMAL LOW (ref 1.0–3.6)
Lymphocyte %: 5.6 %
MCH: 28.5 pg (ref 26.0–34.0)
MCH: 28.5 pg (ref 26.0–34.0)
MCHC: 32.1 g/dL (ref 32.0–36.0)
MCHC: 32.1 g/dL (ref 32.0–36.0)
MCV: 89 fL (ref 80–100)
MCV: 89 fL (ref 80–100)
Monocyte #: 1.2 x10 3/mm — ABNORMAL HIGH (ref 0.2–1.0)
Monocyte #: 0.6 x10 3/mm (ref 0.2–1.0)
Monocyte %: 10 %
Monocyte %: 5.9 %
NEUTROS ABS: 8.7 10*3/uL — AB (ref 1.4–6.5)
NEUTROS PCT: 84.3 %
NEUTROS PCT: 89.2 %
Neutrophil #: 10.1 10*3/uL — ABNORMAL HIGH (ref 1.4–6.5)
Platelet: 194 10*3/uL (ref 150–440)
Platelet: 188 10*3/uL (ref 150–440)
RBC: 3.86 10*6/uL — ABNORMAL LOW (ref 4.40–5.90)
RBC: 3.83 10*6/uL — ABNORMAL LOW (ref 4.40–5.90)
RDW: 16.9 % — AB (ref 11.5–14.5)
RDW: 16.6 % — ABNORMAL HIGH (ref 11.5–14.5)
WBC: 12 10*3/uL — ABNORMAL HIGH (ref 3.8–10.6)
WBC: 9.8 10*3/uL (ref 3.8–10.6)

## 2013-05-06 LAB — TROPONIN I: Troponin-I: 0.02 ng/mL

## 2013-05-06 LAB — AMMONIA: Ammonia, Plasma: <25 mcmol/L (ref 11–32)

## 2013-05-06 LAB — PHOSPHORUS: Phosphorus: 3 mg/dL (ref 2.5–4.9)

## 2013-05-06 LAB — MAGNESIUM: Magnesium: 2.4 mg/dL

## 2013-05-06 LAB — TSH: Thyroid Stimulating Horm: 0.12 u[IU]/mL — ABNORMAL LOW

## 2013-05-07 LAB — BASIC METABOLIC PANEL
BUN: 30 mg/dL — AB (ref 7–18)
CO2: 41 mmol/L — AB (ref 21–32)
CREATININE: 0.6 mg/dL (ref 0.60–1.30)
Calcium, Total: 8.7 mg/dL (ref 8.5–10.1)
Chloride: 102 mmol/L (ref 98–107)
EGFR (African American): >60
EGFR (Non-African Amer.): >60
Glucose: 136 mg/dL — ABNORMAL HIGH (ref 65–99)
OSMOLALITY: 286 (ref 275–301)
Potassium: 4.9 mmol/L (ref 3.5–5.1)
Sodium: 139 mmol/L (ref 136–145)

## 2013-05-07 LAB — CBC WITH DIFFERENTIAL/PLATELET
Basophil #: 0 10*3/uL (ref 0.0–0.1)
Basophil %: 0.1 %
Eosinophil #: 0 10*3/uL (ref 0.0–0.7)
Eosinophil %: 0 %
HCT: 33.8 % — ABNORMAL LOW (ref 40.0–52.0)
HGB: 10.9 g/dL — ABNORMAL LOW (ref 13.0–18.0)
Lymphocyte #: 0.7 10*3/uL — ABNORMAL LOW (ref 1.0–3.6)
Lymphocyte %: 7.1 %
MCH: 28.6 pg (ref 26.0–34.0)
MCHC: 32.2 g/dL (ref 32.0–36.0)
MCV: 89 fL (ref 80–100)
MONOS PCT: 9.8 %
Monocyte #: 0.9 x10 3/mm (ref 0.2–1.0)
NEUTROS ABS: 7.8 10*3/uL — AB (ref 1.4–6.5)
Neutrophil %: 83 %
Platelet: 184 10*3/uL (ref 150–440)
RBC: 3.8 10*6/uL — ABNORMAL LOW (ref 4.40–5.90)
RDW: 16.8 % — ABNORMAL HIGH (ref 11.5–14.5)
WBC: 9.4 10*3/uL (ref 3.8–10.6)

## 2013-05-09 LAB — THEOPHYLLINE LEVEL: Theophylline: <2 ug/mL — ABNORMAL LOW (ref 10.0–20.0)

## 2013-05-11 LAB — PLATELET COUNT: PLATELETS: 153 10*3/uL (ref 150–440)

## 2013-05-14 DIAGNOSIS — J209 Acute bronchitis, unspecified: Secondary | ICD-10-CM | POA: Diagnosis not present

## 2013-05-14 DIAGNOSIS — J449 Chronic obstructive pulmonary disease, unspecified: Secondary | ICD-10-CM | POA: Diagnosis not present

## 2013-05-14 DIAGNOSIS — G4733 Obstructive sleep apnea (adult) (pediatric): Secondary | ICD-10-CM | POA: Diagnosis not present

## 2013-05-14 DIAGNOSIS — J9620 Acute and chronic respiratory failure, unspecified whether with hypoxia or hypercapnia: Secondary | ICD-10-CM | POA: Diagnosis not present

## 2013-05-14 DIAGNOSIS — J441 Chronic obstructive pulmonary disease with (acute) exacerbation: Secondary | ICD-10-CM | POA: Diagnosis not present

## 2013-05-14 DIAGNOSIS — I5030 Unspecified diastolic (congestive) heart failure: Secondary | ICD-10-CM | POA: Diagnosis not present

## 2013-05-14 DIAGNOSIS — M6281 Muscle weakness (generalized): Secondary | ICD-10-CM | POA: Diagnosis not present

## 2013-05-14 DIAGNOSIS — M159 Polyosteoarthritis, unspecified: Secondary | ICD-10-CM | POA: Diagnosis not present

## 2013-05-14 DIAGNOSIS — K219 Gastro-esophageal reflux disease without esophagitis: Secondary | ICD-10-CM | POA: Diagnosis not present

## 2013-05-14 DIAGNOSIS — R0602 Shortness of breath: Secondary | ICD-10-CM | POA: Diagnosis not present

## 2013-05-14 DIAGNOSIS — D72829 Elevated white blood cell count, unspecified: Secondary | ICD-10-CM | POA: Diagnosis not present

## 2013-05-14 DIAGNOSIS — J9610 Chronic respiratory failure, unspecified whether with hypoxia or hypercapnia: Secondary | ICD-10-CM | POA: Diagnosis not present

## 2013-05-14 DIAGNOSIS — I509 Heart failure, unspecified: Secondary | ICD-10-CM | POA: Diagnosis not present

## 2013-05-15 DIAGNOSIS — J449 Chronic obstructive pulmonary disease, unspecified: Secondary | ICD-10-CM

## 2013-05-15 DIAGNOSIS — K219 Gastro-esophageal reflux disease without esophagitis: Secondary | ICD-10-CM

## 2013-05-15 DIAGNOSIS — J4489 Other specified chronic obstructive pulmonary disease: Secondary | ICD-10-CM

## 2013-05-15 DIAGNOSIS — M159 Polyosteoarthritis, unspecified: Secondary | ICD-10-CM | POA: Diagnosis not present

## 2013-05-15 DIAGNOSIS — J9610 Chronic respiratory failure, unspecified whether with hypoxia or hypercapnia: Secondary | ICD-10-CM

## 2013-05-21 DIAGNOSIS — J209 Acute bronchitis, unspecified: Secondary | ICD-10-CM

## 2013-06-04 DIAGNOSIS — R0902 Hypoxemia: Secondary | ICD-10-CM | POA: Diagnosis not present

## 2013-06-04 DIAGNOSIS — J441 Chronic obstructive pulmonary disease with (acute) exacerbation: Secondary | ICD-10-CM | POA: Diagnosis not present

## 2013-06-04 DIAGNOSIS — J9620 Acute and chronic respiratory failure, unspecified whether with hypoxia or hypercapnia: Secondary | ICD-10-CM | POA: Diagnosis not present

## 2013-06-05 DIAGNOSIS — J441 Chronic obstructive pulmonary disease with (acute) exacerbation: Secondary | ICD-10-CM | POA: Diagnosis not present

## 2013-06-05 DIAGNOSIS — J9610 Chronic respiratory failure, unspecified whether with hypoxia or hypercapnia: Secondary | ICD-10-CM | POA: Diagnosis not present

## 2013-06-05 DIAGNOSIS — G4733 Obstructive sleep apnea (adult) (pediatric): Secondary | ICD-10-CM | POA: Diagnosis not present

## 2013-06-05 DIAGNOSIS — Z5181 Encounter for therapeutic drug level monitoring: Secondary | ICD-10-CM | POA: Diagnosis not present

## 2013-06-05 DIAGNOSIS — I5030 Unspecified diastolic (congestive) heart failure: Secondary | ICD-10-CM | POA: Diagnosis not present

## 2013-06-05 DIAGNOSIS — I509 Heart failure, unspecified: Secondary | ICD-10-CM | POA: Diagnosis not present

## 2013-06-06 DIAGNOSIS — E876 Hypokalemia: Secondary | ICD-10-CM | POA: Diagnosis not present

## 2013-06-06 DIAGNOSIS — R0902 Hypoxemia: Secondary | ICD-10-CM | POA: Diagnosis not present

## 2013-06-06 DIAGNOSIS — J441 Chronic obstructive pulmonary disease with (acute) exacerbation: Secondary | ICD-10-CM | POA: Diagnosis not present

## 2013-06-06 DIAGNOSIS — R609 Edema, unspecified: Secondary | ICD-10-CM | POA: Diagnosis not present

## 2013-06-08 DIAGNOSIS — J441 Chronic obstructive pulmonary disease with (acute) exacerbation: Secondary | ICD-10-CM | POA: Diagnosis not present

## 2013-06-08 DIAGNOSIS — G4733 Obstructive sleep apnea (adult) (pediatric): Secondary | ICD-10-CM | POA: Diagnosis not present

## 2013-06-08 DIAGNOSIS — I509 Heart failure, unspecified: Secondary | ICD-10-CM | POA: Diagnosis not present

## 2013-06-08 DIAGNOSIS — J9610 Chronic respiratory failure, unspecified whether with hypoxia or hypercapnia: Secondary | ICD-10-CM | POA: Diagnosis not present

## 2013-06-08 DIAGNOSIS — I5030 Unspecified diastolic (congestive) heart failure: Secondary | ICD-10-CM | POA: Diagnosis not present

## 2013-06-11 ENCOUNTER — Observation Stay: Payer: Self-pay | Admitting: Internal Medicine

## 2013-06-11 DIAGNOSIS — R7309 Other abnormal glucose: Secondary | ICD-10-CM | POA: Diagnosis not present

## 2013-06-11 DIAGNOSIS — J449 Chronic obstructive pulmonary disease, unspecified: Secondary | ICD-10-CM | POA: Diagnosis not present

## 2013-06-11 DIAGNOSIS — G4733 Obstructive sleep apnea (adult) (pediatric): Secondary | ICD-10-CM | POA: Diagnosis not present

## 2013-06-11 DIAGNOSIS — I4891 Unspecified atrial fibrillation: Secondary | ICD-10-CM | POA: Diagnosis not present

## 2013-06-11 DIAGNOSIS — Z9981 Dependence on supplemental oxygen: Secondary | ICD-10-CM | POA: Diagnosis not present

## 2013-06-11 DIAGNOSIS — I079 Rheumatic tricuspid valve disease, unspecified: Secondary | ICD-10-CM | POA: Diagnosis not present

## 2013-06-11 DIAGNOSIS — J9610 Chronic respiratory failure, unspecified whether with hypoxia or hypercapnia: Secondary | ICD-10-CM | POA: Diagnosis not present

## 2013-06-11 DIAGNOSIS — I509 Heart failure, unspecified: Secondary | ICD-10-CM | POA: Diagnosis not present

## 2013-06-11 DIAGNOSIS — Z79899 Other long term (current) drug therapy: Secondary | ICD-10-CM | POA: Diagnosis not present

## 2013-06-11 DIAGNOSIS — Z8249 Family history of ischemic heart disease and other diseases of the circulatory system: Secondary | ICD-10-CM | POA: Diagnosis not present

## 2013-06-11 DIAGNOSIS — J9819 Other pulmonary collapse: Secondary | ICD-10-CM | POA: Diagnosis not present

## 2013-06-11 DIAGNOSIS — M7989 Other specified soft tissue disorders: Secondary | ICD-10-CM | POA: Diagnosis not present

## 2013-06-11 DIAGNOSIS — I499 Cardiac arrhythmia, unspecified: Secondary | ICD-10-CM | POA: Diagnosis not present

## 2013-06-11 DIAGNOSIS — Z833 Family history of diabetes mellitus: Secondary | ICD-10-CM | POA: Diagnosis not present

## 2013-06-11 DIAGNOSIS — I5032 Chronic diastolic (congestive) heart failure: Secondary | ICD-10-CM | POA: Diagnosis not present

## 2013-06-11 DIAGNOSIS — Z886 Allergy status to analgesic agent status: Secondary | ICD-10-CM | POA: Diagnosis not present

## 2013-06-11 DIAGNOSIS — J438 Other emphysema: Secondary | ICD-10-CM | POA: Diagnosis not present

## 2013-06-11 DIAGNOSIS — I4892 Unspecified atrial flutter: Secondary | ICD-10-CM | POA: Diagnosis not present

## 2013-06-11 LAB — CBC
HCT: 38.8 % — AB (ref 40.0–52.0)
HGB: 12.1 g/dL — ABNORMAL LOW (ref 13.0–18.0)
MCH: 27.6 pg (ref 26.0–34.0)
MCHC: 31.1 g/dL — ABNORMAL LOW (ref 32.0–36.0)
MCV: 89 fL (ref 80–100)
PLATELETS: 341 10*3/uL (ref 150–440)
RBC: 4.38 10*6/uL — AB (ref 4.40–5.90)
RDW: 15.8 % — AB (ref 11.5–14.5)
WBC: 16.4 10*3/uL — ABNORMAL HIGH (ref 3.8–10.6)

## 2013-06-11 LAB — BASIC METABOLIC PANEL
Anion Gap: 3 — ABNORMAL LOW (ref 7–16)
BUN: 15 mg/dL (ref 7–18)
CALCIUM: 9.3 mg/dL (ref 8.5–10.1)
CREATININE: 0.79 mg/dL (ref 0.60–1.30)
Chloride: 106 mmol/L (ref 98–107)
Co2: 30 mmol/L (ref 21–32)
GLUCOSE: 100 mg/dL — AB (ref 65–99)
OSMOLALITY: 278 (ref 275–301)
POTASSIUM: 5 mmol/L (ref 3.5–5.1)
SODIUM: 139 mmol/L (ref 136–145)

## 2013-06-11 LAB — APTT: ACTIVATED PTT: 33.9 s (ref 23.6–35.9)

## 2013-06-11 LAB — TROPONIN I: Troponin-I: <0.02 ng/mL

## 2013-06-11 LAB — HEMOGLOBIN A1C: Hemoglobin A1C: 6 % (ref 4.2–6.3)

## 2013-06-11 LAB — CK TOTAL AND CKMB (NOT AT ARMC)
CK, TOTAL: 30 U/L — AB
CK, TOTAL: 32 U/L — AB
CK, TOTAL: 27 U/L — AB
CK-MB: 1.1 ng/mL (ref 0.5–3.6)
CK-MB: 1 ng/mL (ref 0.5–3.6)
CK-MB: 1.3 ng/mL (ref 0.5–3.6)

## 2013-06-11 LAB — PROTIME-INR
INR: 1
Prothrombin Time: 13.3 secs (ref 11.5–14.7)

## 2013-06-11 LAB — TSH: THYROID STIMULATING HORM: 0.857 u[IU]/mL

## 2013-06-12 DIAGNOSIS — I4891 Unspecified atrial fibrillation: Secondary | ICD-10-CM | POA: Diagnosis not present

## 2013-06-12 DIAGNOSIS — I4892 Unspecified atrial flutter: Secondary | ICD-10-CM | POA: Diagnosis not present

## 2013-06-12 DIAGNOSIS — J449 Chronic obstructive pulmonary disease, unspecified: Secondary | ICD-10-CM | POA: Diagnosis not present

## 2013-06-12 DIAGNOSIS — M7989 Other specified soft tissue disorders: Secondary | ICD-10-CM | POA: Diagnosis not present

## 2013-06-12 LAB — COMPREHENSIVE METABOLIC PANEL
ANION GAP: 2 — AB (ref 7–16)
AST: 24 U/L (ref 15–37)
Albumin: 2.8 g/dL — ABNORMAL LOW (ref 3.4–5.0)
Alkaline Phosphatase: 66 U/L
BUN: 12 mg/dL (ref 7–18)
Bilirubin,Total: 0.2 mg/dL (ref 0.2–1.0)
CHLORIDE: 105 mmol/L (ref 98–107)
CO2: 33 mmol/L — AB (ref 21–32)
Calcium, Total: 8.4 mg/dL — ABNORMAL LOW (ref 8.5–10.1)
Creatinine: 0.9 mg/dL (ref 0.60–1.30)
Glucose: 88 mg/dL (ref 65–99)
Osmolality: 279 (ref 275–301)
Potassium: 3.7 mmol/L (ref 3.5–5.1)
SGPT (ALT): 31 U/L (ref 12–78)
SODIUM: 140 mmol/L (ref 136–145)
Total Protein: 6.5 g/dL (ref 6.4–8.2)

## 2013-06-12 LAB — CBC WITH DIFFERENTIAL/PLATELET
BASOS PCT: 0.8 %
Basophil #: 0.1 10*3/uL (ref 0.0–0.1)
EOS PCT: 1.1 %
Eosinophil #: 0.1 10*3/uL (ref 0.0–0.7)
HCT: 34.2 % — ABNORMAL LOW (ref 40.0–52.0)
HGB: 11.3 g/dL — AB (ref 13.0–18.0)
Lymphocyte #: 2 10*3/uL (ref 1.0–3.6)
Lymphocyte %: 16.9 %
MCH: 29.3 pg (ref 26.0–34.0)
MCHC: 32.9 g/dL (ref 32.0–36.0)
MCV: 89 fL (ref 80–100)
Monocyte #: 1.1 x10 3/mm — ABNORMAL HIGH (ref 0.2–1.0)
Monocyte %: 8.9 %
NEUTROS ABS: 8.6 10*3/uL — AB (ref 1.4–6.5)
Neutrophil %: 72.3 %
Platelet: 285 10*3/uL (ref 150–440)
RBC: 3.84 10*6/uL — ABNORMAL LOW (ref 4.40–5.90)
RDW: 15.6 % — AB (ref 11.5–14.5)
WBC: 11.9 10*3/uL — ABNORMAL HIGH (ref 3.8–10.6)

## 2013-06-13 DIAGNOSIS — J441 Chronic obstructive pulmonary disease with (acute) exacerbation: Secondary | ICD-10-CM | POA: Diagnosis not present

## 2013-06-13 DIAGNOSIS — I509 Heart failure, unspecified: Secondary | ICD-10-CM | POA: Diagnosis not present

## 2013-06-13 DIAGNOSIS — G4733 Obstructive sleep apnea (adult) (pediatric): Secondary | ICD-10-CM | POA: Diagnosis not present

## 2013-06-13 DIAGNOSIS — J9610 Chronic respiratory failure, unspecified whether with hypoxia or hypercapnia: Secondary | ICD-10-CM | POA: Diagnosis not present

## 2013-06-13 DIAGNOSIS — I5030 Unspecified diastolic (congestive) heart failure: Secondary | ICD-10-CM | POA: Diagnosis not present

## 2013-06-14 DIAGNOSIS — I509 Heart failure, unspecified: Secondary | ICD-10-CM | POA: Diagnosis not present

## 2013-06-14 DIAGNOSIS — J441 Chronic obstructive pulmonary disease with (acute) exacerbation: Secondary | ICD-10-CM | POA: Diagnosis not present

## 2013-06-14 DIAGNOSIS — J9610 Chronic respiratory failure, unspecified whether with hypoxia or hypercapnia: Secondary | ICD-10-CM | POA: Diagnosis not present

## 2013-06-14 DIAGNOSIS — G4733 Obstructive sleep apnea (adult) (pediatric): Secondary | ICD-10-CM | POA: Diagnosis not present

## 2013-06-14 DIAGNOSIS — I5030 Unspecified diastolic (congestive) heart failure: Secondary | ICD-10-CM | POA: Diagnosis not present

## 2013-06-15 DIAGNOSIS — R209 Unspecified disturbances of skin sensation: Secondary | ICD-10-CM | POA: Diagnosis not present

## 2013-06-15 DIAGNOSIS — N390 Urinary tract infection, site not specified: Secondary | ICD-10-CM | POA: Diagnosis not present

## 2013-06-15 DIAGNOSIS — M47812 Spondylosis without myelopathy or radiculopathy, cervical region: Secondary | ICD-10-CM | POA: Diagnosis not present

## 2013-06-15 DIAGNOSIS — G4733 Obstructive sleep apnea (adult) (pediatric): Secondary | ICD-10-CM | POA: Diagnosis not present

## 2013-06-18 DIAGNOSIS — J9610 Chronic respiratory failure, unspecified whether with hypoxia or hypercapnia: Secondary | ICD-10-CM | POA: Diagnosis not present

## 2013-06-18 DIAGNOSIS — I5030 Unspecified diastolic (congestive) heart failure: Secondary | ICD-10-CM | POA: Diagnosis not present

## 2013-06-18 DIAGNOSIS — J441 Chronic obstructive pulmonary disease with (acute) exacerbation: Secondary | ICD-10-CM | POA: Diagnosis not present

## 2013-06-18 DIAGNOSIS — G4733 Obstructive sleep apnea (adult) (pediatric): Secondary | ICD-10-CM | POA: Diagnosis not present

## 2013-06-18 DIAGNOSIS — I509 Heart failure, unspecified: Secondary | ICD-10-CM | POA: Diagnosis not present

## 2013-06-21 DIAGNOSIS — J449 Chronic obstructive pulmonary disease, unspecified: Secondary | ICD-10-CM | POA: Diagnosis not present

## 2013-06-21 DIAGNOSIS — I471 Supraventricular tachycardia: Secondary | ICD-10-CM | POA: Diagnosis not present

## 2013-06-21 DIAGNOSIS — N390 Urinary tract infection, site not specified: Secondary | ICD-10-CM | POA: Diagnosis not present

## 2013-06-22 DIAGNOSIS — J9610 Chronic respiratory failure, unspecified whether with hypoxia or hypercapnia: Secondary | ICD-10-CM | POA: Diagnosis not present

## 2013-06-22 DIAGNOSIS — I509 Heart failure, unspecified: Secondary | ICD-10-CM | POA: Diagnosis not present

## 2013-06-22 DIAGNOSIS — J441 Chronic obstructive pulmonary disease with (acute) exacerbation: Secondary | ICD-10-CM | POA: Diagnosis not present

## 2013-06-22 DIAGNOSIS — G4733 Obstructive sleep apnea (adult) (pediatric): Secondary | ICD-10-CM | POA: Diagnosis not present

## 2013-06-22 DIAGNOSIS — I5030 Unspecified diastolic (congestive) heart failure: Secondary | ICD-10-CM | POA: Diagnosis not present

## 2013-06-25 DIAGNOSIS — I5030 Unspecified diastolic (congestive) heart failure: Secondary | ICD-10-CM | POA: Diagnosis not present

## 2013-06-25 DIAGNOSIS — I509 Heart failure, unspecified: Secondary | ICD-10-CM | POA: Diagnosis not present

## 2013-06-25 DIAGNOSIS — J9610 Chronic respiratory failure, unspecified whether with hypoxia or hypercapnia: Secondary | ICD-10-CM | POA: Diagnosis not present

## 2013-06-25 DIAGNOSIS — J441 Chronic obstructive pulmonary disease with (acute) exacerbation: Secondary | ICD-10-CM | POA: Diagnosis not present

## 2013-06-25 DIAGNOSIS — G4733 Obstructive sleep apnea (adult) (pediatric): Secondary | ICD-10-CM | POA: Diagnosis not present

## 2013-06-26 DIAGNOSIS — I509 Heart failure, unspecified: Secondary | ICD-10-CM | POA: Diagnosis not present

## 2013-06-26 DIAGNOSIS — I5030 Unspecified diastolic (congestive) heart failure: Secondary | ICD-10-CM | POA: Diagnosis not present

## 2013-06-26 DIAGNOSIS — J9610 Chronic respiratory failure, unspecified whether with hypoxia or hypercapnia: Secondary | ICD-10-CM | POA: Diagnosis not present

## 2013-06-26 DIAGNOSIS — J441 Chronic obstructive pulmonary disease with (acute) exacerbation: Secondary | ICD-10-CM | POA: Diagnosis not present

## 2013-06-26 DIAGNOSIS — G4733 Obstructive sleep apnea (adult) (pediatric): Secondary | ICD-10-CM | POA: Diagnosis not present

## 2013-06-27 DIAGNOSIS — J449 Chronic obstructive pulmonary disease, unspecified: Secondary | ICD-10-CM | POA: Diagnosis not present

## 2013-06-27 DIAGNOSIS — I4892 Unspecified atrial flutter: Secondary | ICD-10-CM | POA: Diagnosis not present

## 2013-06-27 DIAGNOSIS — I10 Essential (primary) hypertension: Secondary | ICD-10-CM | POA: Diagnosis not present

## 2013-06-27 DIAGNOSIS — I4891 Unspecified atrial fibrillation: Secondary | ICD-10-CM | POA: Diagnosis not present

## 2013-07-02 DIAGNOSIS — J9610 Chronic respiratory failure, unspecified whether with hypoxia or hypercapnia: Secondary | ICD-10-CM | POA: Diagnosis not present

## 2013-07-02 DIAGNOSIS — Z9981 Dependence on supplemental oxygen: Secondary | ICD-10-CM | POA: Diagnosis not present

## 2013-07-02 DIAGNOSIS — J438 Other emphysema: Secondary | ICD-10-CM | POA: Diagnosis not present

## 2013-07-02 DIAGNOSIS — R0602 Shortness of breath: Secondary | ICD-10-CM | POA: Diagnosis not present

## 2013-07-02 DIAGNOSIS — J449 Chronic obstructive pulmonary disease, unspecified: Secondary | ICD-10-CM | POA: Diagnosis not present

## 2013-07-03 DIAGNOSIS — I5030 Unspecified diastolic (congestive) heart failure: Secondary | ICD-10-CM | POA: Diagnosis not present

## 2013-07-03 DIAGNOSIS — I509 Heart failure, unspecified: Secondary | ICD-10-CM | POA: Diagnosis not present

## 2013-07-03 DIAGNOSIS — J441 Chronic obstructive pulmonary disease with (acute) exacerbation: Secondary | ICD-10-CM | POA: Diagnosis not present

## 2013-07-03 DIAGNOSIS — I4891 Unspecified atrial fibrillation: Secondary | ICD-10-CM | POA: Diagnosis not present

## 2013-07-03 DIAGNOSIS — J9610 Chronic respiratory failure, unspecified whether with hypoxia or hypercapnia: Secondary | ICD-10-CM | POA: Diagnosis not present

## 2013-07-03 DIAGNOSIS — G4733 Obstructive sleep apnea (adult) (pediatric): Secondary | ICD-10-CM | POA: Diagnosis not present

## 2013-07-04 DIAGNOSIS — J441 Chronic obstructive pulmonary disease with (acute) exacerbation: Secondary | ICD-10-CM | POA: Diagnosis not present

## 2013-07-04 DIAGNOSIS — I5030 Unspecified diastolic (congestive) heart failure: Secondary | ICD-10-CM | POA: Diagnosis not present

## 2013-07-04 DIAGNOSIS — I509 Heart failure, unspecified: Secondary | ICD-10-CM | POA: Diagnosis not present

## 2013-07-04 DIAGNOSIS — G4733 Obstructive sleep apnea (adult) (pediatric): Secondary | ICD-10-CM | POA: Diagnosis not present

## 2013-07-04 DIAGNOSIS — J9610 Chronic respiratory failure, unspecified whether with hypoxia or hypercapnia: Secondary | ICD-10-CM | POA: Diagnosis not present

## 2013-07-06 DIAGNOSIS — G4733 Obstructive sleep apnea (adult) (pediatric): Secondary | ICD-10-CM | POA: Diagnosis not present

## 2013-07-06 DIAGNOSIS — I509 Heart failure, unspecified: Secondary | ICD-10-CM | POA: Diagnosis not present

## 2013-07-06 DIAGNOSIS — I5030 Unspecified diastolic (congestive) heart failure: Secondary | ICD-10-CM | POA: Diagnosis not present

## 2013-07-06 DIAGNOSIS — J9610 Chronic respiratory failure, unspecified whether with hypoxia or hypercapnia: Secondary | ICD-10-CM | POA: Diagnosis not present

## 2013-07-06 DIAGNOSIS — J441 Chronic obstructive pulmonary disease with (acute) exacerbation: Secondary | ICD-10-CM | POA: Diagnosis not present

## 2013-07-10 DIAGNOSIS — J9610 Chronic respiratory failure, unspecified whether with hypoxia or hypercapnia: Secondary | ICD-10-CM | POA: Diagnosis not present

## 2013-07-10 DIAGNOSIS — G4733 Obstructive sleep apnea (adult) (pediatric): Secondary | ICD-10-CM | POA: Diagnosis not present

## 2013-07-10 DIAGNOSIS — J441 Chronic obstructive pulmonary disease with (acute) exacerbation: Secondary | ICD-10-CM | POA: Diagnosis not present

## 2013-07-10 DIAGNOSIS — I509 Heart failure, unspecified: Secondary | ICD-10-CM | POA: Diagnosis not present

## 2013-07-10 DIAGNOSIS — I5030 Unspecified diastolic (congestive) heart failure: Secondary | ICD-10-CM | POA: Diagnosis not present

## 2013-07-12 DIAGNOSIS — J449 Chronic obstructive pulmonary disease, unspecified: Secondary | ICD-10-CM | POA: Diagnosis not present

## 2013-07-12 DIAGNOSIS — I4891 Unspecified atrial fibrillation: Secondary | ICD-10-CM | POA: Diagnosis not present

## 2013-07-13 DIAGNOSIS — J441 Chronic obstructive pulmonary disease with (acute) exacerbation: Secondary | ICD-10-CM | POA: Diagnosis not present

## 2013-07-13 DIAGNOSIS — I5030 Unspecified diastolic (congestive) heart failure: Secondary | ICD-10-CM | POA: Diagnosis not present

## 2013-07-13 DIAGNOSIS — I509 Heart failure, unspecified: Secondary | ICD-10-CM | POA: Diagnosis not present

## 2013-07-13 DIAGNOSIS — J9610 Chronic respiratory failure, unspecified whether with hypoxia or hypercapnia: Secondary | ICD-10-CM | POA: Diagnosis not present

## 2013-07-13 DIAGNOSIS — G4733 Obstructive sleep apnea (adult) (pediatric): Secondary | ICD-10-CM | POA: Diagnosis not present

## 2013-07-16 DIAGNOSIS — J441 Chronic obstructive pulmonary disease with (acute) exacerbation: Secondary | ICD-10-CM | POA: Diagnosis not present

## 2013-07-16 DIAGNOSIS — I509 Heart failure, unspecified: Secondary | ICD-10-CM | POA: Diagnosis not present

## 2013-07-16 DIAGNOSIS — I5030 Unspecified diastolic (congestive) heart failure: Secondary | ICD-10-CM | POA: Diagnosis not present

## 2013-07-16 DIAGNOSIS — J9610 Chronic respiratory failure, unspecified whether with hypoxia or hypercapnia: Secondary | ICD-10-CM | POA: Diagnosis not present

## 2013-07-16 DIAGNOSIS — G4733 Obstructive sleep apnea (adult) (pediatric): Secondary | ICD-10-CM | POA: Diagnosis not present

## 2013-07-17 DIAGNOSIS — I509 Heart failure, unspecified: Secondary | ICD-10-CM | POA: Diagnosis not present

## 2013-07-17 DIAGNOSIS — J9610 Chronic respiratory failure, unspecified whether with hypoxia or hypercapnia: Secondary | ICD-10-CM | POA: Diagnosis not present

## 2013-07-17 DIAGNOSIS — J441 Chronic obstructive pulmonary disease with (acute) exacerbation: Secondary | ICD-10-CM | POA: Diagnosis not present

## 2013-07-17 DIAGNOSIS — I5030 Unspecified diastolic (congestive) heart failure: Secondary | ICD-10-CM | POA: Diagnosis not present

## 2013-07-17 DIAGNOSIS — G4733 Obstructive sleep apnea (adult) (pediatric): Secondary | ICD-10-CM | POA: Diagnosis not present

## 2013-07-19 DIAGNOSIS — J441 Chronic obstructive pulmonary disease with (acute) exacerbation: Secondary | ICD-10-CM | POA: Diagnosis not present

## 2013-07-19 DIAGNOSIS — I5030 Unspecified diastolic (congestive) heart failure: Secondary | ICD-10-CM | POA: Diagnosis not present

## 2013-07-19 DIAGNOSIS — G4733 Obstructive sleep apnea (adult) (pediatric): Secondary | ICD-10-CM | POA: Diagnosis not present

## 2013-07-19 DIAGNOSIS — J9610 Chronic respiratory failure, unspecified whether with hypoxia or hypercapnia: Secondary | ICD-10-CM | POA: Diagnosis not present

## 2013-07-19 DIAGNOSIS — I509 Heart failure, unspecified: Secondary | ICD-10-CM | POA: Diagnosis not present

## 2013-07-23 DIAGNOSIS — I5030 Unspecified diastolic (congestive) heart failure: Secondary | ICD-10-CM | POA: Diagnosis not present

## 2013-07-23 DIAGNOSIS — G4733 Obstructive sleep apnea (adult) (pediatric): Secondary | ICD-10-CM | POA: Diagnosis not present

## 2013-07-23 DIAGNOSIS — I509 Heart failure, unspecified: Secondary | ICD-10-CM | POA: Diagnosis not present

## 2013-07-23 DIAGNOSIS — J441 Chronic obstructive pulmonary disease with (acute) exacerbation: Secondary | ICD-10-CM | POA: Diagnosis not present

## 2013-07-23 DIAGNOSIS — J9610 Chronic respiratory failure, unspecified whether with hypoxia or hypercapnia: Secondary | ICD-10-CM | POA: Diagnosis not present

## 2013-07-26 DIAGNOSIS — G4733 Obstructive sleep apnea (adult) (pediatric): Secondary | ICD-10-CM | POA: Diagnosis not present

## 2013-07-26 DIAGNOSIS — J9610 Chronic respiratory failure, unspecified whether with hypoxia or hypercapnia: Secondary | ICD-10-CM | POA: Diagnosis not present

## 2013-07-26 DIAGNOSIS — J441 Chronic obstructive pulmonary disease with (acute) exacerbation: Secondary | ICD-10-CM | POA: Diagnosis not present

## 2013-07-26 DIAGNOSIS — I509 Heart failure, unspecified: Secondary | ICD-10-CM | POA: Diagnosis not present

## 2013-07-26 DIAGNOSIS — I5030 Unspecified diastolic (congestive) heart failure: Secondary | ICD-10-CM | POA: Diagnosis not present

## 2013-07-27 DIAGNOSIS — I10 Essential (primary) hypertension: Secondary | ICD-10-CM | POA: Diagnosis not present

## 2013-07-27 DIAGNOSIS — I4891 Unspecified atrial fibrillation: Secondary | ICD-10-CM | POA: Diagnosis not present

## 2013-07-30 DIAGNOSIS — I509 Heart failure, unspecified: Secondary | ICD-10-CM | POA: Diagnosis not present

## 2013-07-30 DIAGNOSIS — J441 Chronic obstructive pulmonary disease with (acute) exacerbation: Secondary | ICD-10-CM | POA: Diagnosis not present

## 2013-07-30 DIAGNOSIS — G4733 Obstructive sleep apnea (adult) (pediatric): Secondary | ICD-10-CM | POA: Diagnosis not present

## 2013-07-30 DIAGNOSIS — I5030 Unspecified diastolic (congestive) heart failure: Secondary | ICD-10-CM | POA: Diagnosis not present

## 2013-07-30 DIAGNOSIS — J9610 Chronic respiratory failure, unspecified whether with hypoxia or hypercapnia: Secondary | ICD-10-CM | POA: Diagnosis not present

## 2013-08-02 DIAGNOSIS — J9610 Chronic respiratory failure, unspecified whether with hypoxia or hypercapnia: Secondary | ICD-10-CM | POA: Diagnosis not present

## 2013-08-02 DIAGNOSIS — I509 Heart failure, unspecified: Secondary | ICD-10-CM | POA: Diagnosis not present

## 2013-08-02 DIAGNOSIS — I5030 Unspecified diastolic (congestive) heart failure: Secondary | ICD-10-CM | POA: Diagnosis not present

## 2013-08-02 DIAGNOSIS — J441 Chronic obstructive pulmonary disease with (acute) exacerbation: Secondary | ICD-10-CM | POA: Diagnosis not present

## 2013-08-02 DIAGNOSIS — G4733 Obstructive sleep apnea (adult) (pediatric): Secondary | ICD-10-CM | POA: Diagnosis not present

## 2013-08-14 ENCOUNTER — Encounter: Payer: Self-pay | Admitting: Internal Medicine

## 2013-08-16 DIAGNOSIS — G4730 Sleep apnea, unspecified: Secondary | ICD-10-CM | POA: Diagnosis not present

## 2013-08-16 DIAGNOSIS — J9610 Chronic respiratory failure, unspecified whether with hypoxia or hypercapnia: Secondary | ICD-10-CM | POA: Diagnosis not present

## 2013-08-16 DIAGNOSIS — J438 Other emphysema: Secondary | ICD-10-CM | POA: Diagnosis not present

## 2013-08-16 DIAGNOSIS — G4710 Hypersomnia, unspecified: Secondary | ICD-10-CM | POA: Diagnosis not present

## 2013-08-16 DIAGNOSIS — J449 Chronic obstructive pulmonary disease, unspecified: Secondary | ICD-10-CM | POA: Diagnosis not present

## 2013-08-21 DIAGNOSIS — G4730 Sleep apnea, unspecified: Secondary | ICD-10-CM | POA: Diagnosis not present

## 2013-08-21 DIAGNOSIS — G4710 Hypersomnia, unspecified: Secondary | ICD-10-CM | POA: Diagnosis not present

## 2013-08-21 DIAGNOSIS — G4720 Circadian rhythm sleep disorder, unspecified type: Secondary | ICD-10-CM | POA: Diagnosis not present

## 2013-08-28 DIAGNOSIS — I4891 Unspecified atrial fibrillation: Secondary | ICD-10-CM | POA: Diagnosis not present

## 2013-08-28 DIAGNOSIS — I10 Essential (primary) hypertension: Secondary | ICD-10-CM | POA: Diagnosis not present

## 2013-08-31 ENCOUNTER — Encounter: Payer: Self-pay | Admitting: Internal Medicine

## 2013-09-03 DIAGNOSIS — J449 Chronic obstructive pulmonary disease, unspecified: Secondary | ICD-10-CM | POA: Diagnosis not present

## 2013-09-03 DIAGNOSIS — G4710 Hypersomnia, unspecified: Secondary | ICD-10-CM | POA: Diagnosis not present

## 2013-09-03 DIAGNOSIS — J9610 Chronic respiratory failure, unspecified whether with hypoxia or hypercapnia: Secondary | ICD-10-CM | POA: Diagnosis not present

## 2013-09-03 DIAGNOSIS — G4730 Sleep apnea, unspecified: Secondary | ICD-10-CM | POA: Diagnosis not present

## 2013-09-03 DIAGNOSIS — J438 Other emphysema: Secondary | ICD-10-CM | POA: Diagnosis not present

## 2013-09-05 ENCOUNTER — Ambulatory Visit: Payer: Self-pay | Admitting: Internal Medicine

## 2013-09-21 DIAGNOSIS — J449 Chronic obstructive pulmonary disease, unspecified: Secondary | ICD-10-CM | POA: Insufficient documentation

## 2013-09-21 DIAGNOSIS — E876 Hypokalemia: Secondary | ICD-10-CM | POA: Diagnosis not present

## 2013-09-21 DIAGNOSIS — I4891 Unspecified atrial fibrillation: Secondary | ICD-10-CM | POA: Diagnosis not present

## 2013-09-25 DIAGNOSIS — J438 Other emphysema: Secondary | ICD-10-CM | POA: Diagnosis not present

## 2013-09-25 DIAGNOSIS — I5030 Unspecified diastolic (congestive) heart failure: Secondary | ICD-10-CM | POA: Diagnosis not present

## 2013-09-25 DIAGNOSIS — E876 Hypokalemia: Secondary | ICD-10-CM | POA: Diagnosis not present

## 2013-09-25 DIAGNOSIS — J449 Chronic obstructive pulmonary disease, unspecified: Secondary | ICD-10-CM | POA: Diagnosis not present

## 2013-09-25 DIAGNOSIS — G4733 Obstructive sleep apnea (adult) (pediatric): Secondary | ICD-10-CM | POA: Diagnosis not present

## 2013-09-26 DIAGNOSIS — I4891 Unspecified atrial fibrillation: Secondary | ICD-10-CM | POA: Diagnosis not present

## 2013-09-26 DIAGNOSIS — E876 Hypokalemia: Secondary | ICD-10-CM | POA: Diagnosis not present

## 2013-09-26 DIAGNOSIS — Z6841 Body Mass Index (BMI) 40.0 and over, adult: Secondary | ICD-10-CM | POA: Diagnosis not present

## 2013-09-26 DIAGNOSIS — J9610 Chronic respiratory failure, unspecified whether with hypoxia or hypercapnia: Secondary | ICD-10-CM | POA: Diagnosis not present

## 2013-10-01 ENCOUNTER — Encounter: Payer: Self-pay | Admitting: Internal Medicine

## 2013-11-29 DIAGNOSIS — J9610 Chronic respiratory failure, unspecified whether with hypoxia or hypercapnia: Secondary | ICD-10-CM | POA: Diagnosis not present

## 2013-11-29 DIAGNOSIS — G4733 Obstructive sleep apnea (adult) (pediatric): Secondary | ICD-10-CM | POA: Diagnosis not present

## 2013-11-29 DIAGNOSIS — M545 Low back pain, unspecified: Secondary | ICD-10-CM | POA: Diagnosis not present

## 2013-11-29 DIAGNOSIS — K2100 Gastro-esophageal reflux disease with esophagitis, without bleeding: Secondary | ICD-10-CM | POA: Diagnosis not present

## 2013-11-29 DIAGNOSIS — R319 Hematuria, unspecified: Secondary | ICD-10-CM | POA: Diagnosis not present

## 2013-11-29 DIAGNOSIS — J438 Other emphysema: Secondary | ICD-10-CM | POA: Diagnosis not present

## 2013-12-03 DIAGNOSIS — R319 Hematuria, unspecified: Secondary | ICD-10-CM | POA: Diagnosis not present

## 2014-01-04 ENCOUNTER — Ambulatory Visit: Payer: Self-pay | Admitting: Family Medicine

## 2014-01-04 DIAGNOSIS — R059 Cough, unspecified: Secondary | ICD-10-CM | POA: Diagnosis not present

## 2014-01-04 DIAGNOSIS — R0989 Other specified symptoms and signs involving the circulatory and respiratory systems: Secondary | ICD-10-CM | POA: Diagnosis not present

## 2014-01-04 DIAGNOSIS — J441 Chronic obstructive pulmonary disease with (acute) exacerbation: Secondary | ICD-10-CM | POA: Diagnosis not present

## 2014-01-04 DIAGNOSIS — R05 Cough: Secondary | ICD-10-CM | POA: Diagnosis not present

## 2014-01-04 DIAGNOSIS — G4733 Obstructive sleep apnea (adult) (pediatric): Secondary | ICD-10-CM | POA: Diagnosis not present

## 2014-01-04 DIAGNOSIS — J069 Acute upper respiratory infection, unspecified: Secondary | ICD-10-CM | POA: Diagnosis not present

## 2014-01-04 DIAGNOSIS — J9610 Chronic respiratory failure, unspecified whether with hypoxia or hypercapnia: Secondary | ICD-10-CM | POA: Diagnosis not present

## 2014-01-04 DIAGNOSIS — J449 Chronic obstructive pulmonary disease, unspecified: Secondary | ICD-10-CM | POA: Diagnosis not present

## 2014-01-04 DIAGNOSIS — J209 Acute bronchitis, unspecified: Secondary | ICD-10-CM | POA: Diagnosis not present

## 2014-01-04 DIAGNOSIS — J438 Other emphysema: Secondary | ICD-10-CM | POA: Diagnosis not present

## 2014-01-23 IMAGING — CR DG CHEST 1V PORT
1 series · 2 of 2 positions shown · non-contrast
Comparison: 07/31/2012

CLINICAL DATA: Post intubation.

EXAM:
PORTABLE CHEST - 1 VIEW

[Series 1: ap · 0.17mm/px · 2 of 2 slices shown]
[im 1/2]
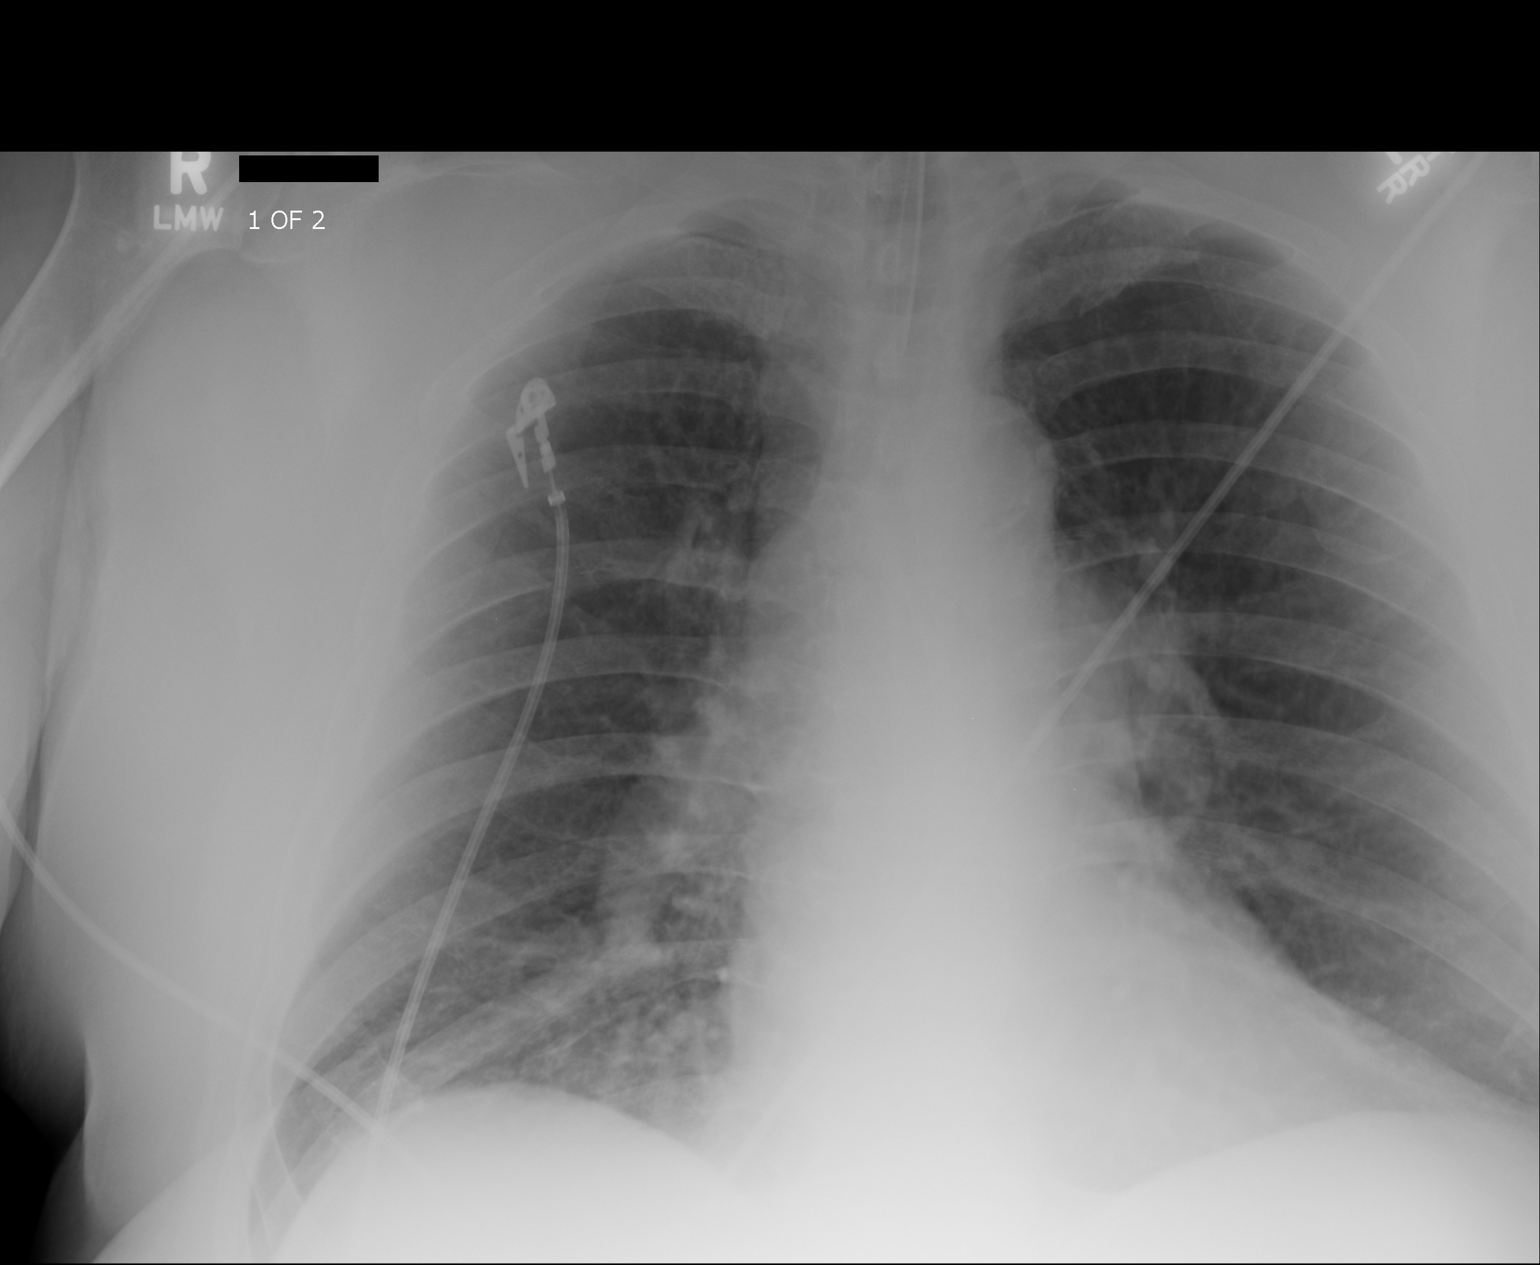
[im 2/2]
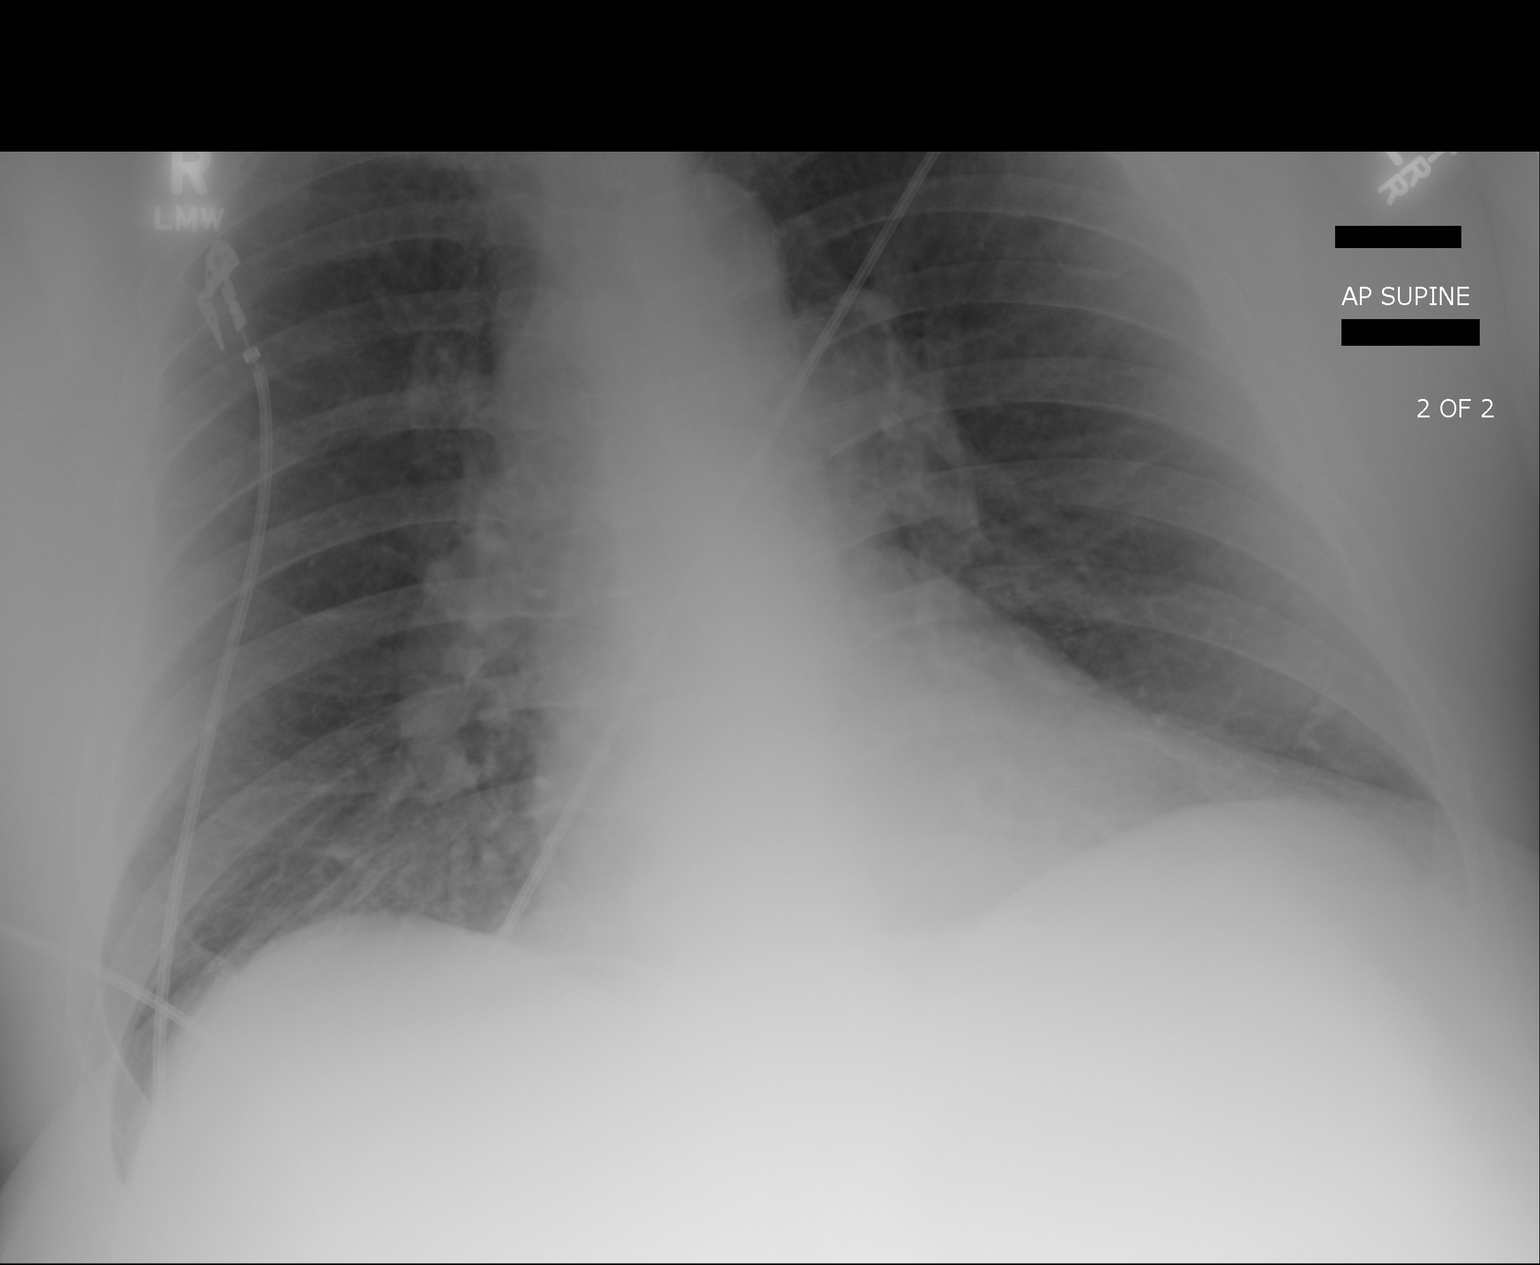

[2 of 2 positions shown; findings below may reference images not displayed]

FINDINGS: The endotracheal tube placed with tip measuring 5.4 cm above the
carinal. Enteric tube is not well visualized due to underexposed
image. Heart size and pulmonary vascularity are normal. The lungs
are clear. No blunting of costophrenic angles. No pneumothorax.
IMPRESSION: Endotracheal tube tip measures about 5.4 cm above the carinal. No
active pulmonary disease.

## 2014-01-29 DIAGNOSIS — D72829 Elevated white blood cell count, unspecified: Secondary | ICD-10-CM | POA: Diagnosis not present

## 2014-02-27 DIAGNOSIS — R0602 Shortness of breath: Secondary | ICD-10-CM | POA: Diagnosis not present

## 2014-02-27 DIAGNOSIS — J449 Chronic obstructive pulmonary disease, unspecified: Secondary | ICD-10-CM | POA: Diagnosis not present

## 2014-03-02 IMAGING — CR DG CHEST 2V
1 series · 2 of 2 positions shown · non-contrast
Comparison: 05/04/2013

CLINICAL DATA: Irregular heartbeat

EXAM:
CHEST  2 VIEW

[Series 2: w chest pa · 0.14mm/px · 2 of 2 slices shown]
[im 1/2]
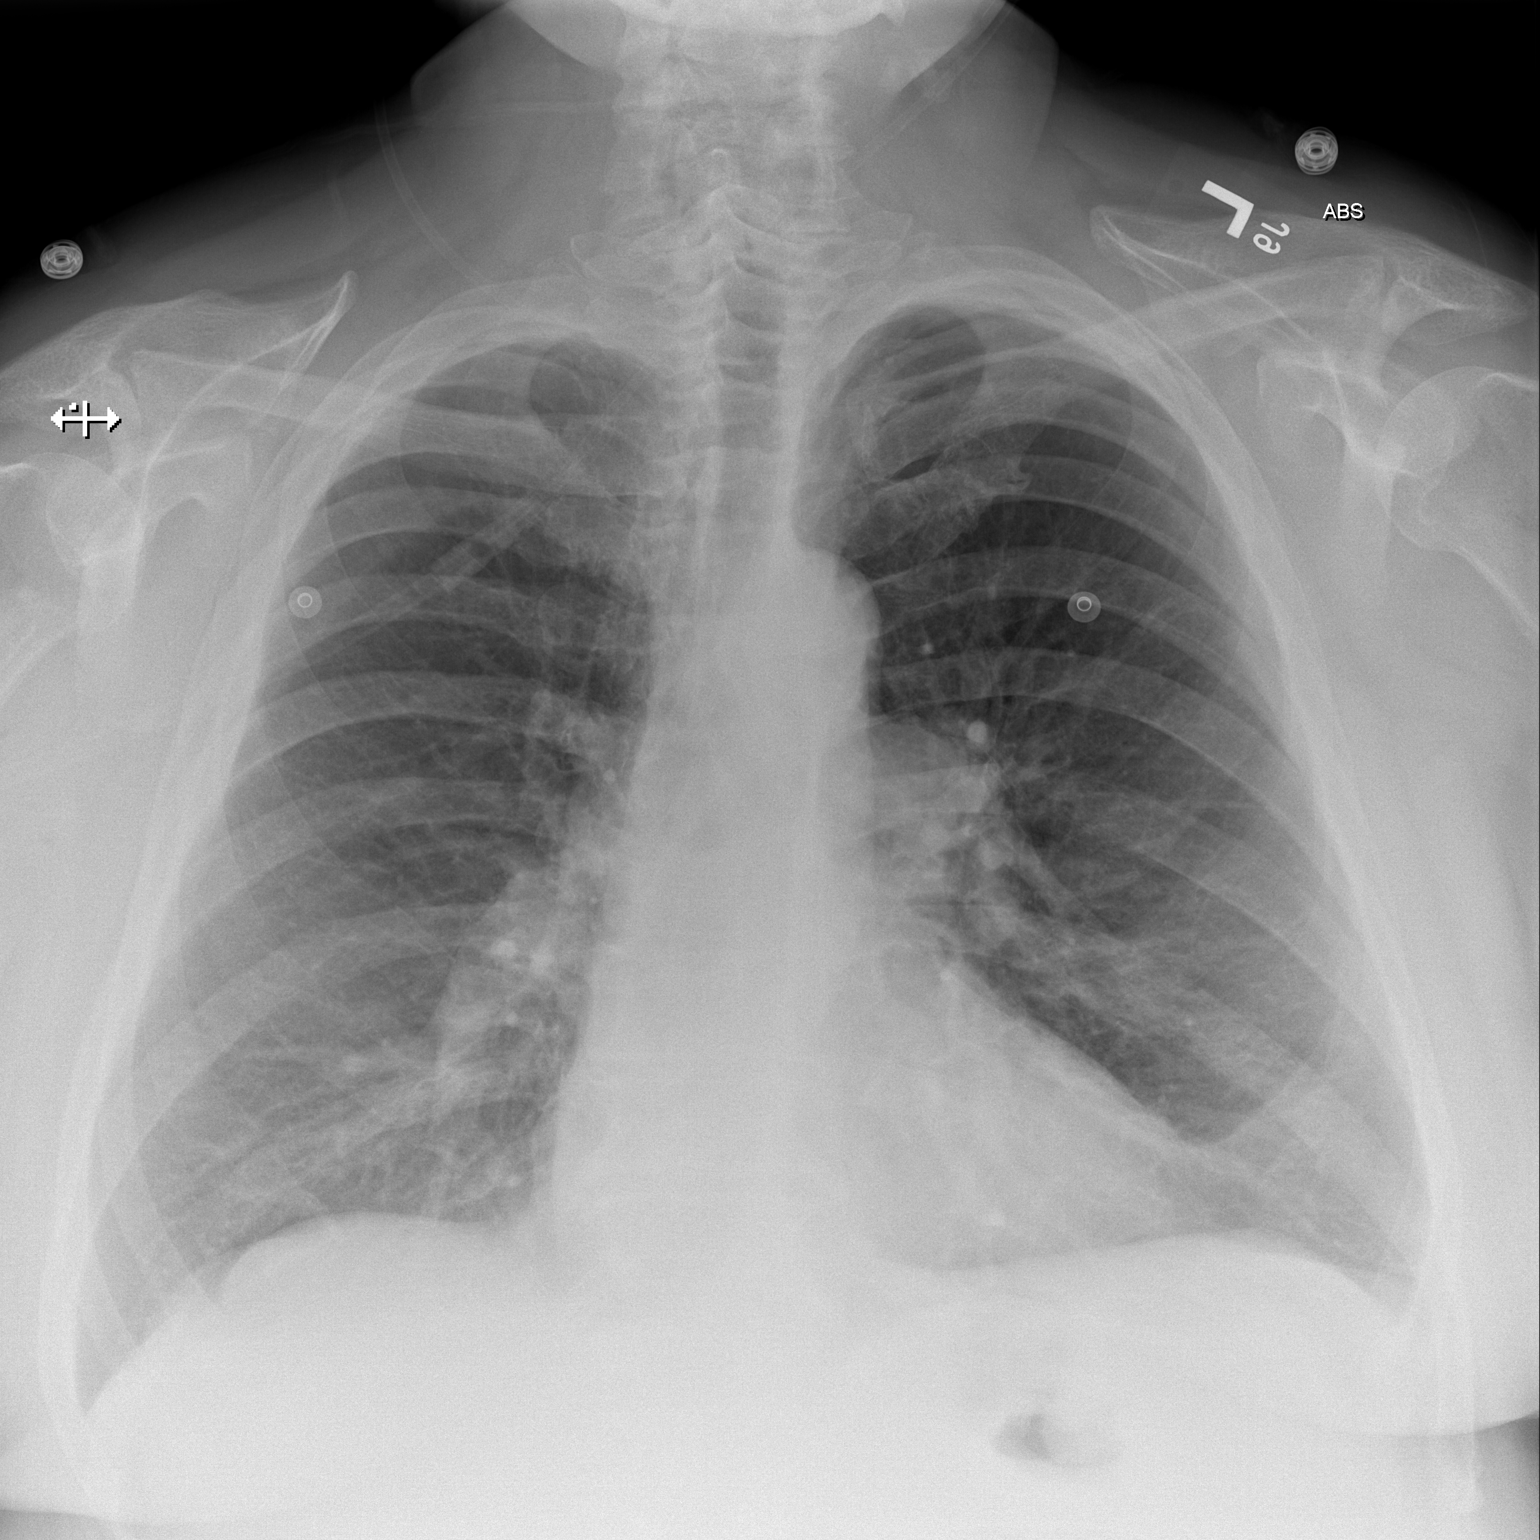
[im 2/2]
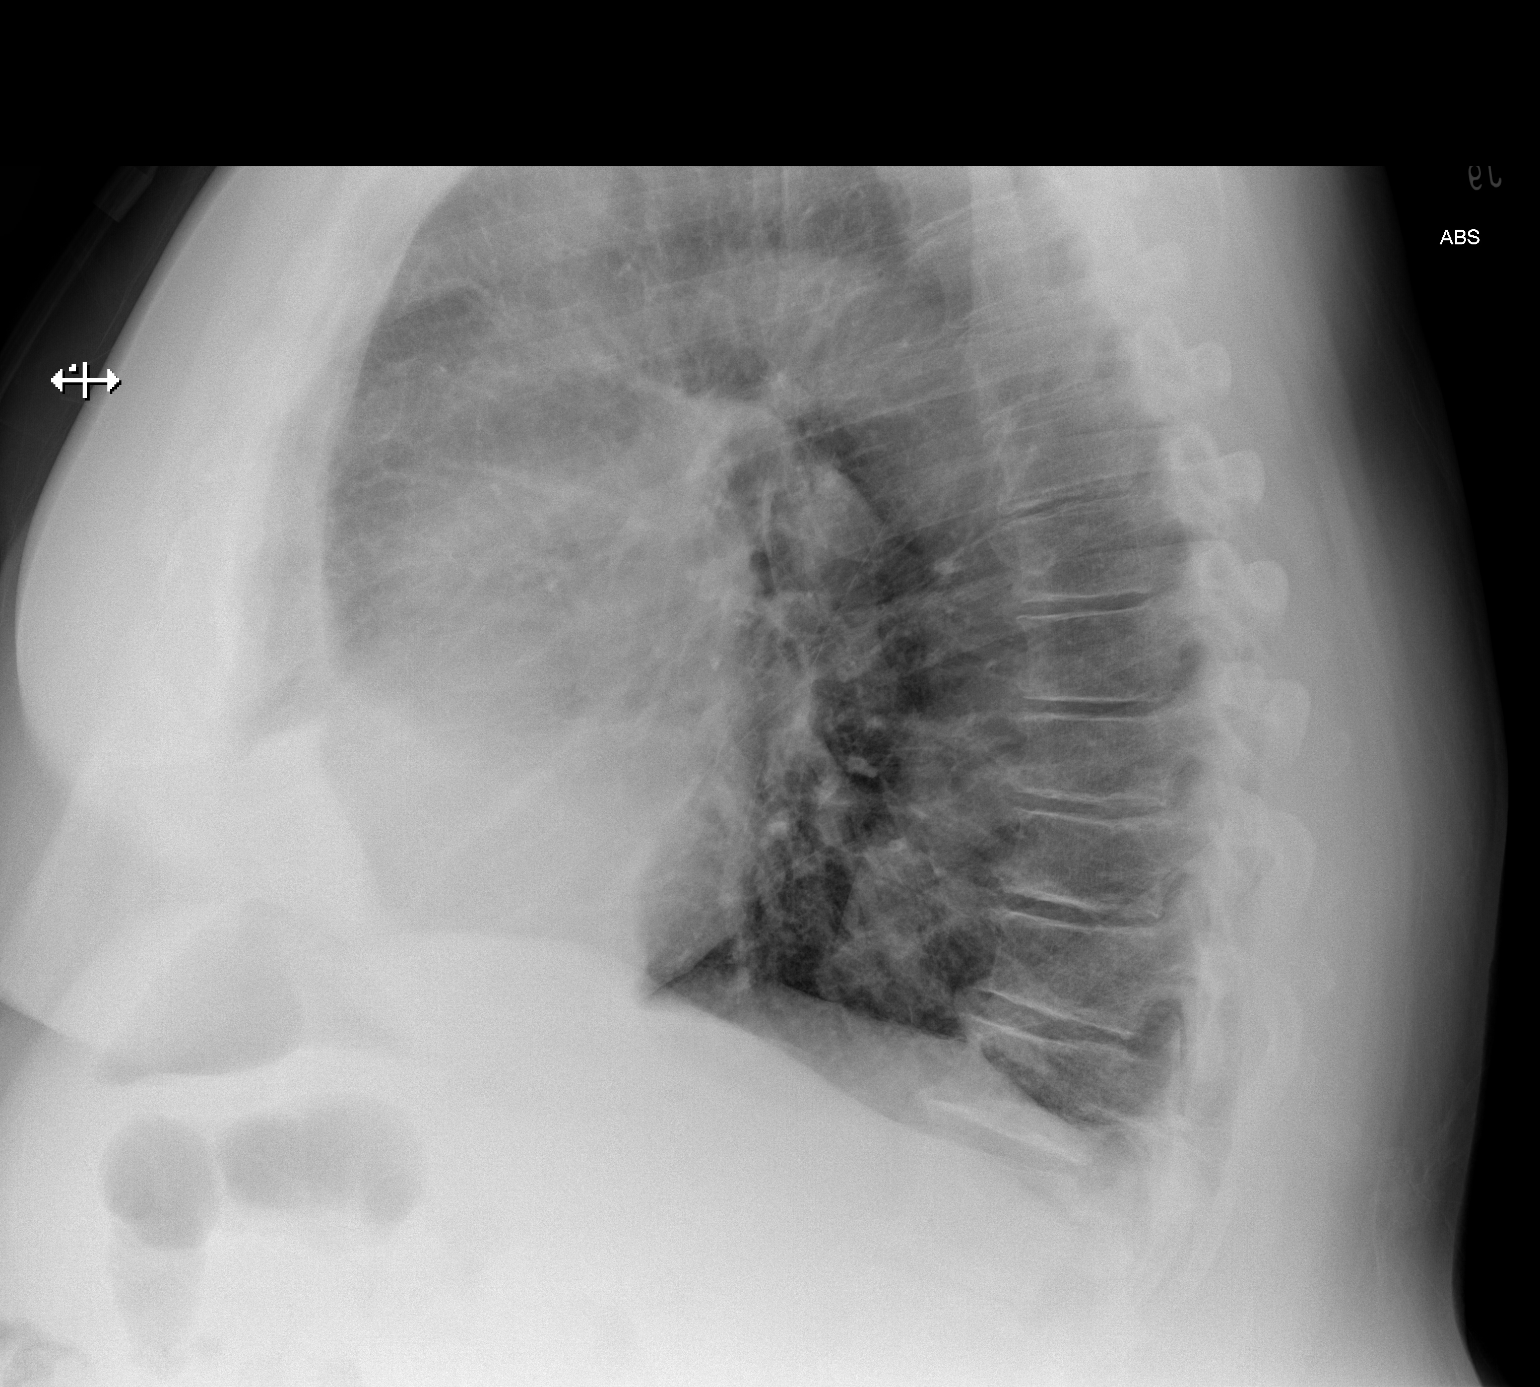

[2 of 2 positions shown; findings below may reference images not displayed]

FINDINGS: Cardiac shadow is within normal limits. The lungs are clear
bilaterally. No sizable infiltrate is seen. Mild degenerative
changes of the thoracic spine are noted.
IMPRESSION: No acute abnormality seen.

## 2014-03-02 IMAGING — US US EXTREM LOW VENOUS BILAT
1 series · 14 of 24 positions shown · non-contrast
Comparison: None

CLINICAL DATA: Swelling in lower extremities, previously on blood
thinners

EXAM:
BILATERAL LOWER EXTREMITY VENOUS DOPPLER ULTRASOUND
TECHNIQUE: Gray-scale sonography with graded compression, as well as color
Doppler and duplex ultrasound, were performed to evaluate the deep
venous system from the level of the common femoral vein through the
popliteal and proximal calf veins. Spectral Doppler was utilized to
evaluate flow at rest and with distal augmentation maneuvers.

[Series 1: us extrem low venous bilat · 0.09mm/px · 14 of 64 slices shown]
[im 1/64]
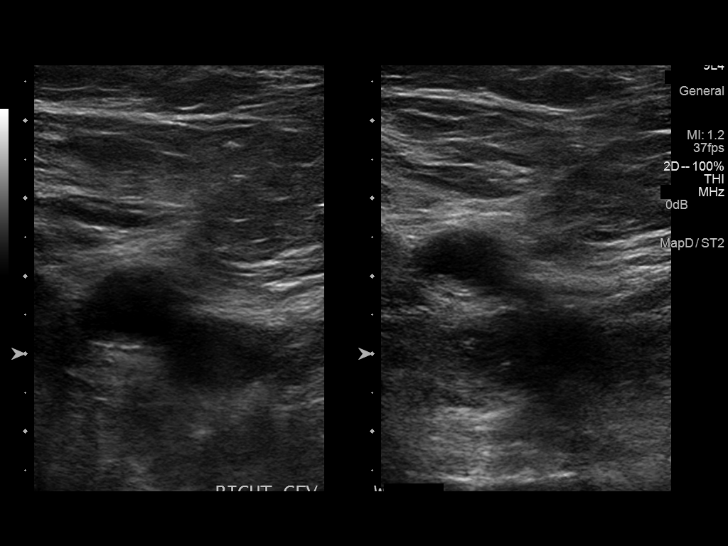
[im 6/64]
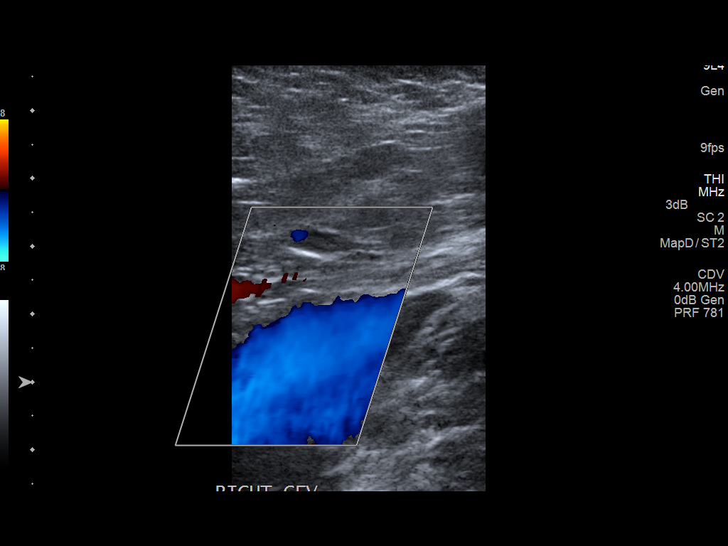
[im 11/64]
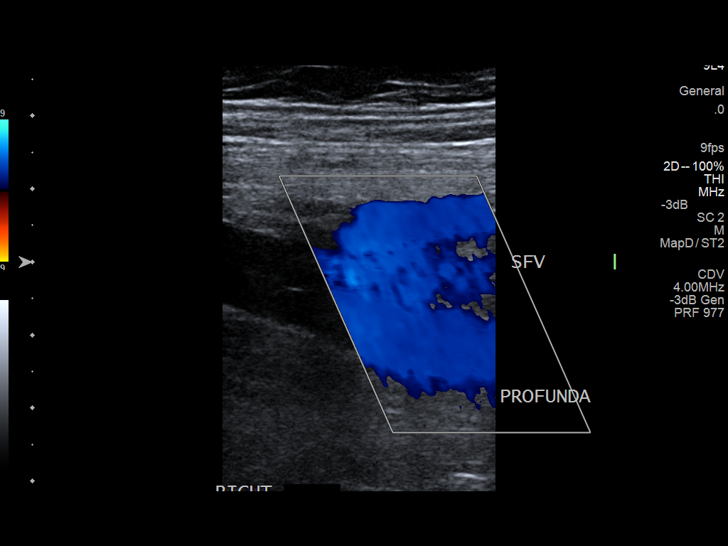
[im 17/64]
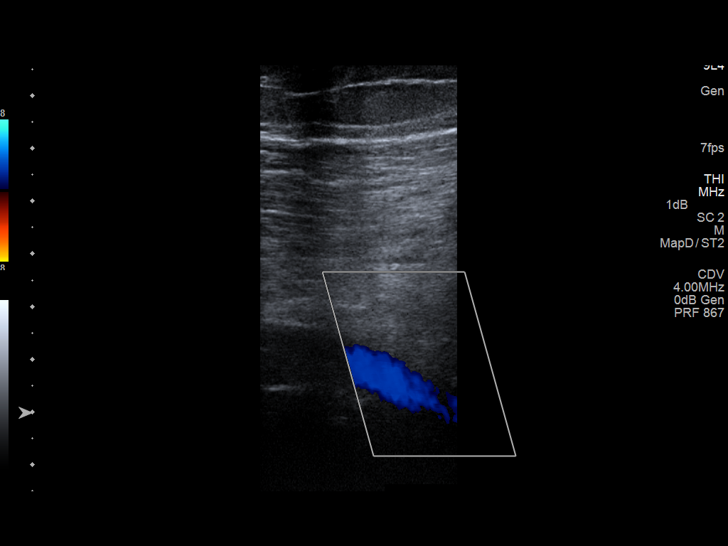
[im 20/64]
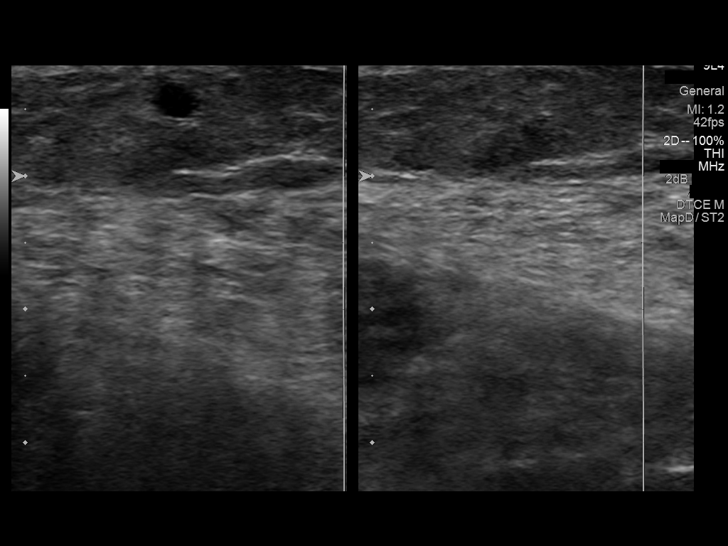
[im 25/64]
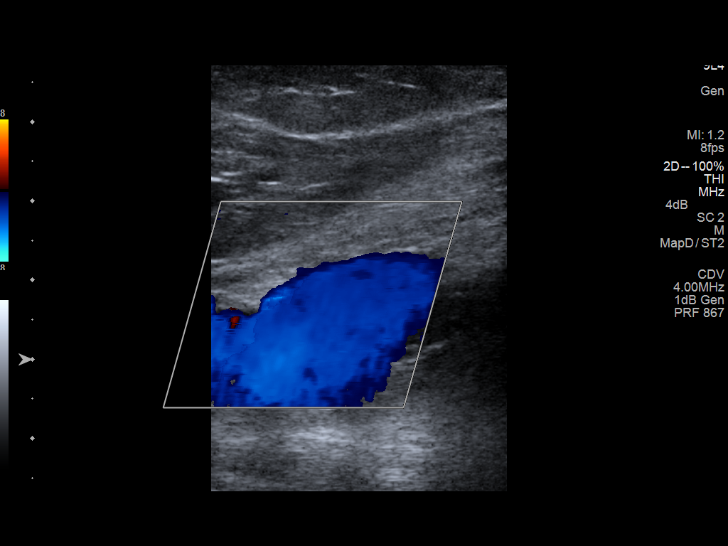
[im 31/64]
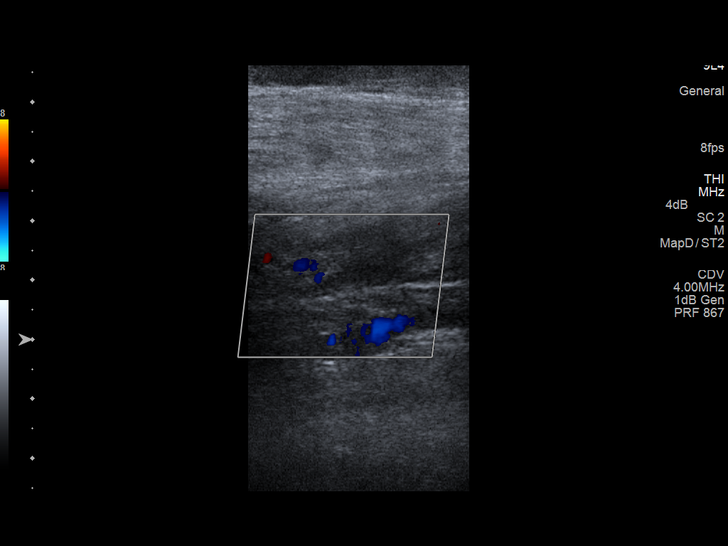
[im 33/64]
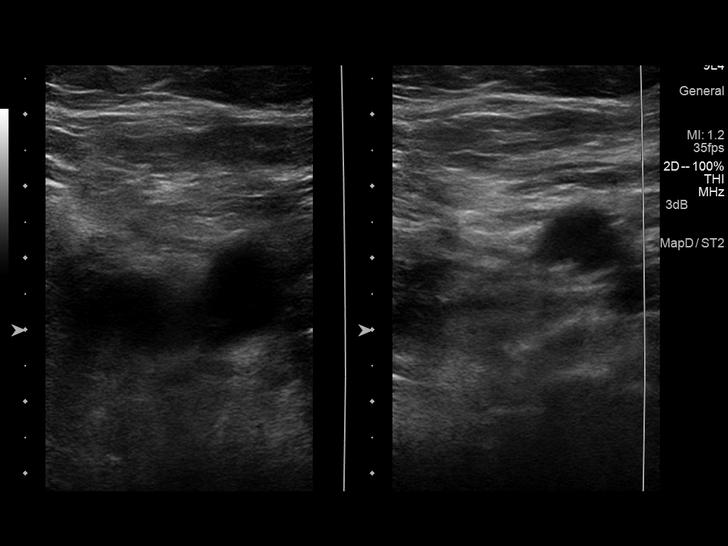
[im 39/64]
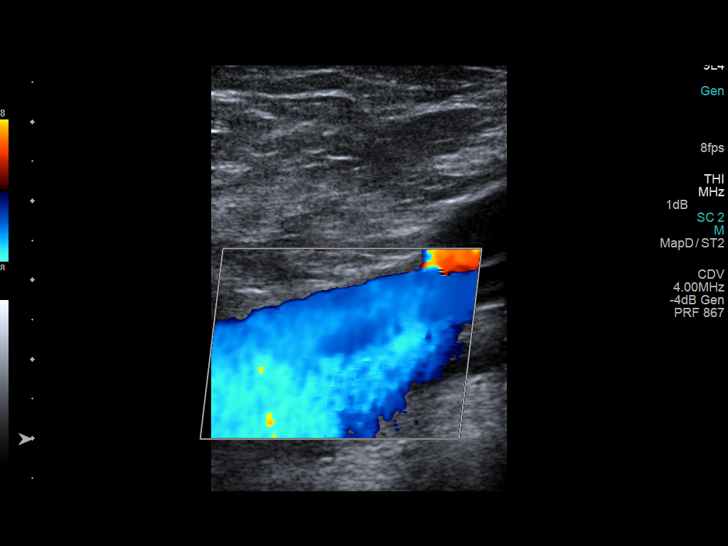
[im 44/64]
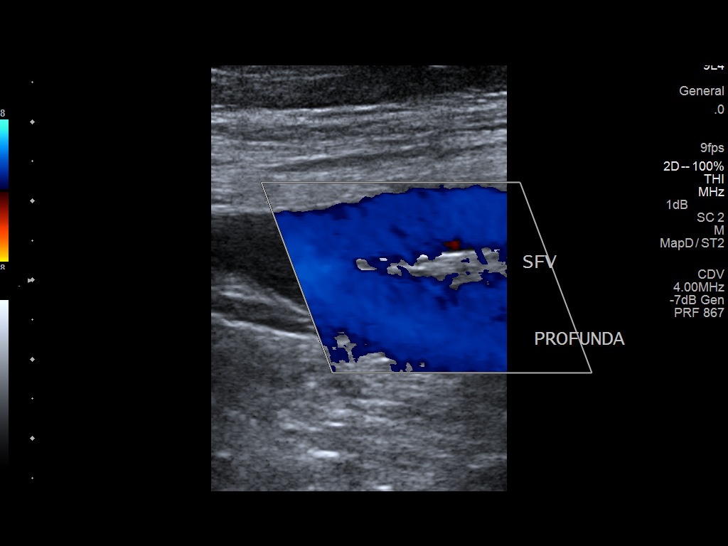
[im 50/64]
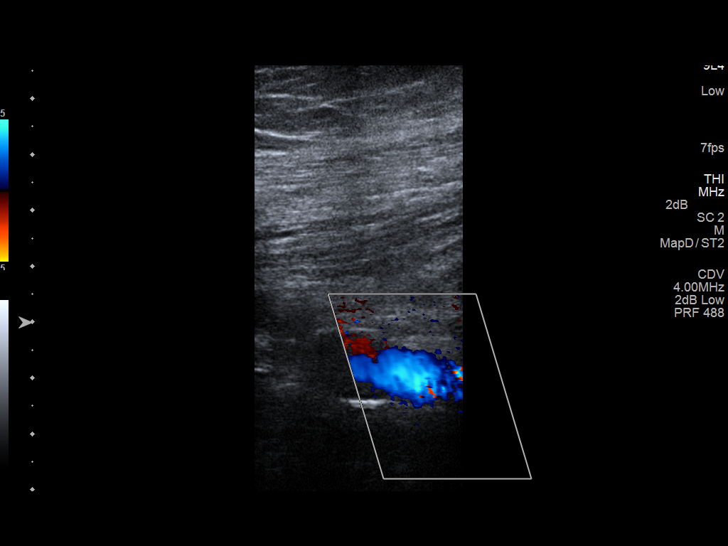
[im 53/64]
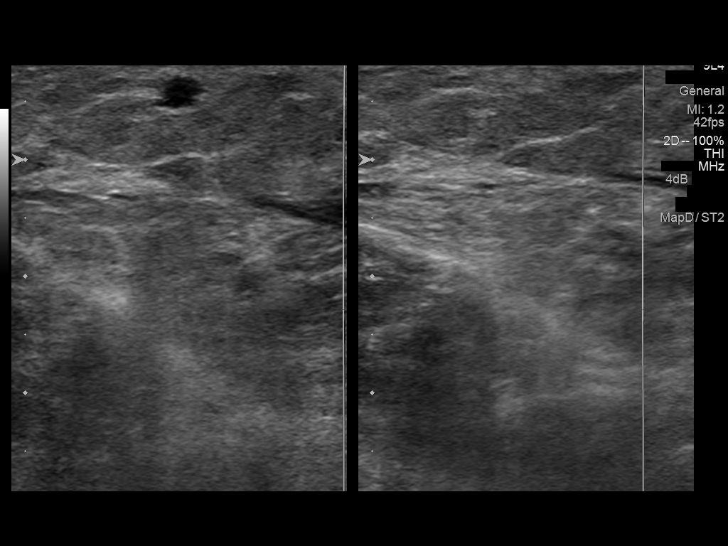
[im 58/64]
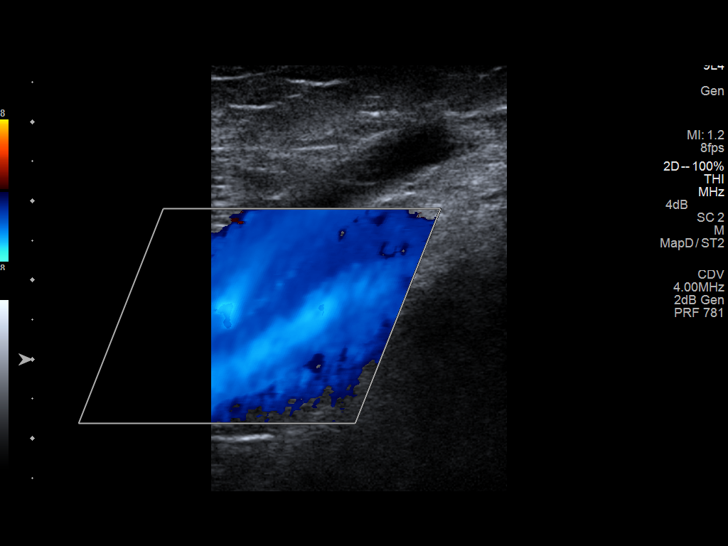
[im 64/64]
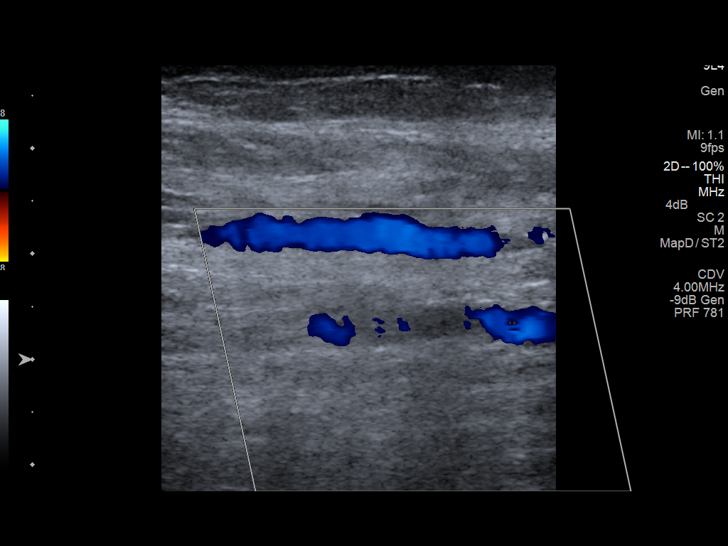

[14 of 24 positions shown; findings below may reference images not displayed]

FINDINGS: Thrombus within deep veins:  None visualized.

Compressibility of deep veins:  Normal.

Duplex waveform respiratory phasicity:  Normal.

Duplex waveform response to augmentation:  Normal.

Venous reflux:  None visualized.

Other findings:  None visualized.
IMPRESSION: No evidence of deep venous thrombosis in the lower extremities.

## 2014-03-07 DIAGNOSIS — J431 Panlobular emphysema: Secondary | ICD-10-CM | POA: Diagnosis not present

## 2014-03-07 DIAGNOSIS — G4733 Obstructive sleep apnea (adult) (pediatric): Secondary | ICD-10-CM | POA: Diagnosis not present

## 2014-03-07 DIAGNOSIS — I482 Chronic atrial fibrillation: Secondary | ICD-10-CM | POA: Diagnosis not present

## 2014-03-07 DIAGNOSIS — J449 Chronic obstructive pulmonary disease, unspecified: Secondary | ICD-10-CM | POA: Diagnosis not present

## 2014-03-07 DIAGNOSIS — J9611 Chronic respiratory failure with hypoxia: Secondary | ICD-10-CM | POA: Diagnosis not present

## 2014-03-07 DIAGNOSIS — Z9981 Dependence on supplemental oxygen: Secondary | ICD-10-CM | POA: Diagnosis not present

## 2014-03-07 DIAGNOSIS — R0602 Shortness of breath: Secondary | ICD-10-CM | POA: Diagnosis not present

## 2014-03-26 DIAGNOSIS — I509 Heart failure, unspecified: Secondary | ICD-10-CM | POA: Insufficient documentation

## 2014-03-26 DIAGNOSIS — I482 Chronic atrial fibrillation: Secondary | ICD-10-CM | POA: Diagnosis not present

## 2014-03-26 DIAGNOSIS — I5032 Chronic diastolic (congestive) heart failure: Secondary | ICD-10-CM | POA: Diagnosis not present

## 2014-03-26 DIAGNOSIS — J42 Unspecified chronic bronchitis: Secondary | ICD-10-CM | POA: Diagnosis not present

## 2014-06-12 DIAGNOSIS — J441 Chronic obstructive pulmonary disease with (acute) exacerbation: Secondary | ICD-10-CM | POA: Diagnosis not present

## 2014-06-12 DIAGNOSIS — J449 Chronic obstructive pulmonary disease, unspecified: Secondary | ICD-10-CM | POA: Diagnosis not present

## 2014-06-12 DIAGNOSIS — J431 Panlobular emphysema: Secondary | ICD-10-CM | POA: Diagnosis not present

## 2014-06-12 DIAGNOSIS — I482 Chronic atrial fibrillation: Secondary | ICD-10-CM | POA: Diagnosis not present

## 2014-06-12 DIAGNOSIS — J9611 Chronic respiratory failure with hypoxia: Secondary | ICD-10-CM | POA: Diagnosis not present

## 2014-06-12 DIAGNOSIS — G4733 Obstructive sleep apnea (adult) (pediatric): Secondary | ICD-10-CM | POA: Diagnosis not present

## 2014-06-27 DIAGNOSIS — I5032 Chronic diastolic (congestive) heart failure: Secondary | ICD-10-CM | POA: Diagnosis not present

## 2014-06-27 DIAGNOSIS — I4891 Unspecified atrial fibrillation: Secondary | ICD-10-CM | POA: Diagnosis not present

## 2014-06-27 DIAGNOSIS — E876 Hypokalemia: Secondary | ICD-10-CM | POA: Diagnosis not present

## 2014-06-27 DIAGNOSIS — J42 Unspecified chronic bronchitis: Secondary | ICD-10-CM | POA: Diagnosis not present

## 2014-08-09 DIAGNOSIS — Z125 Encounter for screening for malignant neoplasm of prostate: Secondary | ICD-10-CM | POA: Diagnosis not present

## 2014-08-09 DIAGNOSIS — Z1389 Encounter for screening for other disorder: Secondary | ICD-10-CM | POA: Diagnosis not present

## 2014-08-09 DIAGNOSIS — Z0000 Encounter for general adult medical examination without abnormal findings: Secondary | ICD-10-CM | POA: Diagnosis not present

## 2014-08-09 LAB — FECAL OCCULT BLOOD, GUAIAC: Fecal Occult Blood: NEGATIVE

## 2014-08-14 DIAGNOSIS — J9611 Chronic respiratory failure with hypoxia: Secondary | ICD-10-CM | POA: Diagnosis not present

## 2014-08-14 DIAGNOSIS — Z125 Encounter for screening for malignant neoplasm of prostate: Secondary | ICD-10-CM | POA: Diagnosis not present

## 2014-08-14 DIAGNOSIS — I482 Chronic atrial fibrillation: Secondary | ICD-10-CM | POA: Diagnosis not present

## 2014-08-14 DIAGNOSIS — G4733 Obstructive sleep apnea (adult) (pediatric): Secondary | ICD-10-CM | POA: Diagnosis not present

## 2014-08-14 LAB — HEPATIC FUNCTION PANEL
ALT: 17 U/L (ref 10–40)
AST: 19 U/L (ref 14–40)
Alkaline Phosphatase: 64 U/L (ref 25–125)
Bilirubin, Total: 0.3 mg/dL

## 2014-08-14 LAB — LIPID PANEL
CHOLESTEROL: 163 mg/dL (ref 0–200)
HDL: 51 mg/dL (ref 35–70)
LDL CALC: 82 mg/dL
TRIGLYCERIDES: 150 mg/dL (ref 40–160)

## 2014-08-14 LAB — BASIC METABOLIC PANEL
BUN: 17 mg/dL (ref 4–21)
CREATININE: 0.9 mg/dL (ref <=1.3)
GLUCOSE: 110 mg/dL
Potassium: 4.7 mmol/L (ref 3.4–5.3)
Sodium: 147 mmol/L (ref 137–147)

## 2014-08-14 LAB — PSA: PSA: 1.4

## 2014-08-14 LAB — TSH: TSH: 1.02 u[IU]/mL (ref <=5.90)

## 2014-08-23 NOTE — Discharge Summary (Signed)
PATIENT NAME:  [name], [name] MR#:  [id number] DATE OF BIRTH:  [birth year]  DATE OF ADMISSION:  05/13/2012 DATE OF DISCHARGE:  05/20/2012  PRIMARY CARE PHYSICIAN:   Brunetta JeansMichael Dimeo, MD  CONSULTATIONS:   Pulmonary, Erin FullingKurian Kasa, MD  FINAL DIAGNOSES: 1.  Acute on chronic respiratory failure.  2.  Chronic obstructive pulmonary disease exacerbation.  3.  Pneumonia.  4.  Obstructive sleep apnea.  5.  Congestive heart failure.   CONDITION: Stable.   CODE STATUS: Full code.   HOME MEDICATIONS:  Advair 500 mcg/50 mcg inhalations twice a day, calcium carbonate 1250 mg p.o. tablets 1 tab a day, potassium 10 mEq b.i.d., Lasix 40 mg p.o. b.i.d., Singulair 10 mg p.o. once a day, vitamin C 500 mg p.o. daily, Combivent CFC 20 mcg/100 mcg inhalation one 4 times a day, DuoNeb 0/25/2.5 mg inhalation solution 1 via nebulizer 6 times a day p.r.n., Zantac 175 mg p.o. daily p.r.n., prednisone 60 mg p.o. daily, then taper down to 20 mg and then continue the patient's home medication, 10 mg daily.  The patient may need home health with nursing. Home oxygen 4 liters by nasal cannula.   DIET: Low sodium, low fat, low cholesterol diet.   ACTIVITY: As tolerated. In addition, the patient needs CPAP at night.   REASON FOR ADMISSION:  Shortness of breath.   HOSPITAL COURSE: The patient is a 64 year old Caucasian male with a history of at once COPD on chronic oxygen at 2 liters at home. The patient presented today to the ED with 5 days of worsening shortness of breath.  In addition, he feverish been increased productive cough.  He started on Tamiflu and prednisone taper before this admission by primary care physician.  In the ED, the patient was also started oxygen  at 4 liters, saturation was 84%.  He was treated with nebulizer, IV Solu-Medrol and Zithromax.  For detailed history and physical examination, please refer to the admission note dictated by Dr. Darrick Huntsmanullo.  On the  admission date, the patient's chest x-ray  showed COPD with mild interstitial edema suggestive of early congestive heart failure. White count 15.7, hemoglobin 12.8.  The patient was admitted for acute on chronic respiratory failure due to COPD exacerbation and possible pneumonia. After admission the patient has been treated with Zithromax and Rocephin Tamiflu.  In addition, the patient has been treated with IV Solu-Medrol, nebulizer. Advair, Spiriva.  The patient was placed on high flow oxygen and then gradually tapered down to 4 liters by nasal cannula. The patient's symptoms hare much improving but he still has some mild shortness of breath and mild cough.  Today the patient is on 3 liters by nasal cannula. He has mild shortness of breath on exertion, but it appears that it is the patient's baseline.  The patient is relatively stable and will be discharged home with home oxygen and home health. I discussed the patient's discharge plan with the patient, the patient's wife and the case manager.   TIME SPENT: About 36 minutes.    ____________________________ Shaune PollackQing Zaccary Creech, MD qc:ct D: 05/20/2012 13:21:35 ET T: 05/21/2012 14:39:53 ET JOB#: [id number]  cc: Shaune PollackQing Gina Leblond, MD, <Dictator> Shaune PollackQING Toussaint Golson MD ELECTRONICALLY SIGNED 05/23/2012 16:19

## 2014-08-23 NOTE — H&P (Signed)
PATIENT NAME:  [name], [name] MR#:  [id number] DATE OF BIRTH:  1950/08/02  DATE OF ADMISSION:  05/13/2012  PRIMARY CARE PHYSICIAN: Dr. Dossie Arbourrissman PULMONOLOGIST: None  CHIEF COMPLAINT: Shortness of breath.   HISTORY OF PRESENT ILLNESS: The patient is a 64 year old white male with a history of advanced COPD, on chronic O2 at 4 liters per minute at home, who presents with a 5-day history of worsening shortness of breath. The patient states that he was visited by his son, who is recovering from the flu, over the weekend and woke up Tuesday morning feeling achy and feverish with increased cough productive of purulent sputum. He was started on Tamiflu and a prednisone taper on Wednesday by his primary care doctor, Dr. Dossie Arbourrissman, but failed to improve and has become increasingly more short of breath as the week has progressed. Again, his cough is productive of purulent sputum, and he has been having subjective fevers and chills and myalgias at home. He normally wears 4 liters of O2 at home and has becoming increasingly short of breath. When he presented to the ER today, his O2 saturations on 4 liters were 84% and have improved to 91% on 5.5 liters.  In the Emergency Room, he has been given 3 DuoNebs, IV Solu-Medrol, a dose of azithromycin and is being admitted for further treatment. He was also given 20 mg of IV Lasix.   PAST MEDICAL HISTORY:  COPD with history of prior intubation in 2003. No intubation since then. He has been on 4 liters of O2 since then. He did not receive the flu shot this year. He refuses historically due to prior adverse reactions.   CURRENT MEDICATIONS: Zantac 150 mg daily, vitamin C daily, Singulair 10 mg daily, Lasix 20 mg daily, K-Dur 10 mEq daily, Advair Diskus 500/50, 1 puff twice daily, Tamiflu 75 mg twice daily starting on Wednesday, January 8th, and prednisone currently, last dose was 40 mg today.   PAST SURGICAL HISTORY: None.   ALLERGIES: HE HAS A HISTORY OF RESPIRATORY  DISTRESS TO ASPIRIN.   LAST HOSPITALIZATION: In 2003 for vent-dependent respiratory failure.  FAMILY HISTORY: Notable for coronary artery disease and diabetes.   SOCIAL HISTORY: He is a current nontobacco user. He does not drink alcohol. He is married.   REVIEW OF SYSTEMS: Positive for fever and fatigue with no weakness, pain, or weight changes. No recent blurred or double vision. No history of ear pain or hearing loss. He does have seasonal rhinitis. He does report some sore throat and sinus congestion this week. He does report cough, wheezing, and dyspnea at rest for the last 4 to 5 days. He has a history of advanced chronic obstructive pulmonary disease, on O2 chronically. He denies chest pain and orthopnea. He has had dyspnea with exertion. No palpitations, syncope or history of hypertension. He denies any nausea, vomiting, diarrhea, abdominal pain, or changes in bowel habits recently. He denies dysuria and incontinence. No history of polyuria, nocturia or thyroid problems. No history of anemia, easy bruising or bleeding. No chronic neck, back, knee, shoulder or hip pain. No history of numbness, weakness, dysarthria, vertigo, ataxia or dementia. No history of CVAs or transient ischemic attacks. No history of anxiety, insomnia or depression.   PHYSICAL EXAMINATION: GENERAL: This is an obese elderly male who is sitting up in bed using accessory muscles and talking in very short sentences, 3 or 4 words at a time.  ADMISSION VITAL SIGNS: Initial temperature 99, pulse 64 and regular, respirations 24, blood pressure  133/97, sating 90% on 5.5 liters.  HEENT: Pupils are equal, round, and reactive to light. Extraocular movements are intact. Oropharynx is benign.  NECK: Supple. He has a fat neck, but no JVD is appreciated and no carotid bruits are appreciated.  LUNGS: Notable for decreased breath movement in all fields and a prolonged expiratory phase. He is moving very little air. Increased effort is noted.  He is using accessory muscles again and speaking in sentences.  CARDIOVASCULAR: Heart sounds are very distant but regular. Chest wall is nontender. He has no lower extremity edema, and pedal pulses are palpable.  ABDOMEN: Obese, nontender, nondistended with good bowel sounds. No hepatosplenomegaly is appreciated.  EXTREMITIES: He is moving all extremities and strength is 5 out of 5.  LYMPH: There is no cervical, axillary, inguinal, or supraclavicular lymphadenopathy.  NEUROLOGICAL: Grossly nonfocal with intact cranial nerves and no aphasia or dysarthria.  PSYCHIATRIC: He is alert and oriented to person, place, and time and making good eye contact.    LABORATORY, DIAGNOSTIC AND RADIOLOGICAL DATA:  On admission, sodium 130, potassium 4.3, chloride 91, bicarbonate 34, BUN 16, creatinine 0.65, glucose 124. White count 15.7, hemoglobin 12.8, platelets 183. Basic natriuretic peptide is low at 29.0. Albumin is low at 3.3. Troponin is low at less than 0.02.   EKG shows normal sinus rhythm, no axis deviation. Chest x-ray shows COPD with mild interstitial edema suggestive of early congestive heart failure.   ASSESSMENT AND PLAN: 1. Acute on chronic respiratory failure: The patient is currently showing increased work of breathing. He is, however, awake and alert. He has a history of prior intubation 10 years ago and current source of his acute respiratory failure is exposure to flu-like illness. He has been on Tamiflu for several days. He has been given IV steroids, IV azithromycin, several DuoNebs in the Emergency Room and is still very short of breath. ABG is pending. He is currently on 5 liters. We will consider BiPAP. He has a history of sleep apnea but has not been using a CPAP in the last several days due to him sleeping on the couch.  2. History of obstructive sleep apnea: The patient wears CPAP at night. We will ask his wife to tell us the setting so we can get him on CPAP tonight.  3. Influenza-like  illness: The patient is already on Tamiflu. He will be given a flu shot before he leaves this discharge. We will cover also with azithromycin and continue steroids for COPD exacerbation.   CODE STATUS:  The patient is FULL CODE.    ESTIMATED TIME OF CARE: 45 minutes.   ____________________________ Duncan Dull, MD tt:cb D: 05/13/2012 20:20:56 ET T: 05/14/2012 07:59:19 ET JOB#: [id number]  cc: Duncan Dull, MD, <Dictator> Duncan Dull MD ELECTRONICALLY SIGNED 06/12/2012 19:40

## 2014-08-24 NOTE — Consult Note (Signed)
Brief Consult Note: Diagnosis: respiratory failure with abnormal troponin; pt has history of oxygen dependent copd. currently intubated.   Patient was seen by consultant.   Recommend further assessment or treatment.   Comments: 64 yo male with history of oxygen dependent copd admitted after presenting with worseing shortness of breath and hypoxia and hypercapnea. Currently hemodynamically stable. Troponin minimally elevated to 0.07. CXR is clear. Etiology of troponin is demand ischemia. doesnt appear to be seocndasry to acs. Would continue to aggrerssively treat respiratory faiiluire and wean vent as tolerated. No invasive or noninvasive ischemic work up incidated at present. FUrther recs pending course.  Electronic Signatures: Dalia HeadingFath, Jeffie Spivack A (MD)  (Signed 02-Jan-15 14:42)  Authored: Brief Consult Note   Last Updated: 02-Jan-15 14:42 by Dalia HeadingFath, Davaris Youtsey A (MD)

## 2014-08-24 NOTE — H&P (Signed)
PATIENT NAME:  [name], [name] MR#:  [id number] DATE OF BIRTH:  May 09, 1950  DATE OF ADMISSION:  05/04/2013  REFERRING PHYSICIAN:  Dr. Charlesetta Ivory.  PRIMARY CARE PHYSICIAN:  Dr. Jeananne Rama.   CHIEF COMPLAINT:  Shortness of breath.   HISTORY OF PRESENT ILLNESS:  This is a 64 year old male with significant past medical history of COPD, chronic respiratory failure on 4 liters nasal cannula at baseline, the patient presents with complaints of shortness of breath, the patient was seen by his PCP for upper respiratory infection where he was given Z-Pak and by mouth prednisone tapering dose, he was supposed to follow up on Monday, but the patient's symptoms have been worsening which prompted him to come to the ED, the patient came via EMS, he received IV Solu-Medrol and continue with DuoNebs, by then he was extremely lethargic upon presentation, where he required intubation, the patient's ABG after intubation showing pCO2 of 116, the patient's chest x-ray did not show any opacity or infiltrate, the patient had lowest temperature at 99.5, the patient has been complaining of productive cough, dark in color, at presentation he was started on IV Rocephin and Zithromax, the patient was intubated once before due to COPD in 2003, but none since then and the patient is currently a nonsmoker, hospitalist service requested to admit the patient for acute or chronic respiratory failure and COPD exacerbation.   PAST MEDICAL HISTORY: 1.  Chronic respiratory failure on 4 liters nasal cannula.  2.  COPD.   HOME MEDICATIONS: 1.  Singulair 10 mg oral daily.  2.  Advair 500/50 1 puff twice daily.  3.  Prednisone tapering dose.  4.  Currently taking Z-Pak.  5.  Lasix 40 mg oral 2 times a day.  6.  Zantac 175 mg oral as needed.  7.  Potassium chloride 10 mEq oral 2 times a day.   ALLERGIES:  HAS ALLERGY TO ASPIRIN.   PAST SURGICAL HISTORY:  None.   FAMILY HISTORY:  Notable for coronary artery disease and  diabetes.   SOCIAL HISTORY:  The patient currently nontobacco-user.  No alcohol.  No illicit drug use, married, lives with his wife at home.   REVIEW OF SYSTEMS:   Cannot be obtained as the patient is currently sedated and intubated.   PHYSICAL EXAMINATION: VITAL SIGNS:  Temperature 99.5, pulse currently 101, blood pressure 151/105, saturating 100% on assist control volume mode, tidal volume 500, respiratory rate currently 22, PEEP of 5, FiO2 of 60%. GENERAL:  Sedated, morbidly obese male who looks comfortable in bed, in no apparent distress.  HEENT:  Head atraumatic, normocephalic.  Pupils equal, reactive to light.  Pink conjunctivae.  Anicteric sclerae.  Dry oral mucosa, intubated.  NECK:  Supple.  No thyromegaly.  No JVD.  Has thick, short neck.  CHEST:  Had decreased air entry bilaterally with diffuse wheezing.  No rales or rhonchi.  CARDIOVASCULAR:  S1, S2 heard.  No rubs, murmurs or gallops.  ABDOMEN:  Obese, soft, nontender, nondistended.  Bowel sounds present.  EXTREMITIES:  No edema.  No clubbing.  No cyanosis.  Pedal pulses felt bilaterally.  PSYCHIATRIC:  Unable to evaluate as the patient is sedated.  NEUROLOGIC:  Unable to evaluate as the patient is sedated.  SKIN:  Normal skin turgor.  Warm and dry.  LYMPHATICS:  No cervical lymphadenopathy.  MUSCULOSKELETAL:  No joint effusion or erythema.   PERTINENT LABORATORY DATA:  BNP 33, glucose 171, BUN 12, creatinine 0.74, sodium 134, potassium 4.3, chloride 95, CO2 38,  ALT 30, AST 39, alk phos 84.  Troponin less than 0.02.  White blood cell 15, hemoglobin 11.7, hematocrit 37, platelets 225.  ABG, pH 7.14, pCO2 116, pO2 119.  Chest x-ray showing endotracheal tube 5.4 cm above the carina.  No active pulmonary disease.  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   ASSESSMENT AND PLAN: 1.  Acute on chronic respiratory failure, the patient is currently intubated.  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  We will recheck ABG in a.m.  2.  Chronic obstructive pulmonary disease exacerbation.  The patient will be continued on Advair, will be started on IV Solu-Medrol and DuoNebs every four hours.  As well, will be continued on as needed albuterol every two hours, given the fact he is having productive sputum he will be started on IV Rocephin and Zithromax.  3.  Leukocytosis.  This is most likely related to prednisone use and stress.  We will check urinalysis.  4.  Deep vein thrombosis prophylaxis.  SubQ heparin.  5.  Gastrointestinal prophylaxis.  On proton pump inhibitor.  6.  CODE STATUS:  THE PATIENT IS FULL CODE.   Total time of critical care time spent on this patient 55 minutes.    ____________________________ Albertine Patricia, MD dse:ea D: 05/04/2013 04:14:20 ET T: 05/04/2013 04:45:42 ET JOB#: [id number]  cc: Albertine Patricia, MD, <Dictator> Gabrielle Wakeland Graciela Husbands MD ELECTRONICALLY SIGNED 05/06/2013 4:52

## 2014-08-24 NOTE — Discharge Summary (Signed)
PATIENT NAME:  [name], [name] MR#:  [id number] DATE OF BIRTH:  1951/01/17  DATE OF ADMISSION:  06/11/2013 DATE OF DISCHARGE:  06/12/2013  ADMITTING DIAGNOSIS: New-onset fibrillation/flutter.   DISCHARGE DIAGNOSES:  1.  New-onset atrial flutter.  2.  Chronic obstructive pulmonary disease.   CONSULTATIONS: Cardiology.   LABORATORIES AT DISCHARGE: White blood cells 11, hemoglobin 11, hematocrit 35, platelets 285. Sodium 140, potassium 3.7, chloride 105, bicarb 33, BUN 12, creatinine 0.90. Glucose is 88. Cardiac enzymes x3 negative.   Ultrasound of the lower extremities negative for DVT.   CT chest showed no evidence of PE.   HOSPITAL COURSE: A 64 year old male presented with palpitations found to have new-onset atrial flutter. For further details, please refer to [name] and P.  1.  Atrial flutter. The patient was admitted to telemetry. Cardiac enzymes were checked which were all normal. Cardiology was consulted. He actually converted back into normal sinus rhythm. Dr. Lady GaryFath recommended Cardizem 180 mg daily. His [place] score is zero. We did recommend aspirin 81 mg daily. HE DOES SAY THAT HE HAS AN ALLERGY TO ASPIRIN, BUT HE IS ABLE TO TAKE ASPIRIN 81 MG, SO WE WILL DISCHARGE HIM ON THIS. The patient will follow up with Dr. Lady GaryFath in 1 week.  2.  History of COPD, which was stable. The patient wears O2 at home.  3.  OSA. The patient has a BiPAP at night.  4.  Leg swelling on admission. Dopplers are negative. He actually did not have any leg swelling at discharge.   DISCHARGE MEDICATIONS:  1.  Cardizem 180 mg daily.  2.  Advair Diskus 500/50 b.i.d.  3.  Potassium 10 mEq b.i.d.  4.  Lasix 40 mg b.i.d.  5.  Singulair 10 mg daily.  6.  Spiriva 18 mg daily.  7.  Zantac 150 b.i.d.  8.  Ibuprofen 400 mg t.i.d. p.r.n.  9.  DuoNebs 3 mL four times a day.  10.  Albuterol 2 puffs b.i.d.  11.  Diltiazem 180 mg daily.  12.  Aspirin 81 mg daily.   DISCHARGE OXYGEN: Two liters nasal cannula.    DISCHARGE DIET: Regular diet.   DISCHARGE ACTIVITY: As tolerated.  FOLLOWUP: The patient will follow up next week with Dr. Lady GaryFath.  TOTAL TIME SPENT: 35 minutes.  The patient was medically stable for discharge.   ____________________________ Perri Lamagna P. Juliene PinaMody, MD spm:np D: 06/12/2013 14:52:48 ET T: 06/12/2013 19:15:23 ET JOB#: [id number]  cc: Yvette Roark P. Juliene PinaMody, MD, <Dictator> Darlin PriestlyKenneth A. Lady GaryFath, MD Janyth ContesSITAL P Quintan Saldivar MD ELECTRONICALLY SIGNED 06/13/2013 14:35

## 2014-08-24 NOTE — Discharge Summary (Signed)
PATIENT NAME:  [name], [name] MR#:  [id number] DATE OF BIRTH:  10/03/1950  DATE OF ADMISSION:  05/04/2013 DATE OF DISCHARGE:  05/14/2013  For detailed note, please take a look at the history and physical done on admission by Dr. Randol Kern.   DIAGNOSES AT DISCHARGE: As follows:  Acute on chronic respiratory failure secondary to chronic obstructive pulmonary disease exacerbation. Chronic obstructive pulmonary disease exacerbation.  Morbid obesity. Suspected sleep apnea on BiPAP at nights. History of diastolic congestive heart failure.   The patient is being discharged on a low-sodium diet.   ACTIVITY: As tolerated.   Followup is with Dr. Dortha Kern in the next 1 to 2 weeks.   DISCHARGE MEDICATIONS:  Advair 500/50, 1 puff b.i.d., potassium 10 mEq b.i.d., Lasix 40 mg b.i.d., Singular 10 mg daily, Combivent Respimat 1 puff q.i.d., Zantac 175 mg daily as needed, guaifenesin 5 mL q.4 hours as needed, prednisone taper starting at 60 mg down to 10 mg over the next 12 days, Roxanol 20 mg q.4 hours as needed for air hunger, Levaquin 250 mg daily x 3 days, Spiriva 1 puff daily.   CONSULTANTS DURING THE HOSPITAL COURSE: Dr. Meredeth Ide from pulmonary critical care and Dr. Mariel Kansky from cardiology.   PERTINENT STUDIES DONE DURING THE HOSPITAL COURSE: As follows:  A chest x-ray done on admission showing endotracheal tube tip at 5.4 cm above the carina. No acute pulmonary disease. A KUB done on 05/06/2013, showing no evidence of any acute abdominal process.   HOSPITAL COURSE: This is a 64 year old male with medical problems, as mentioned above, presented to the hospital on 05/04/2013, due to shortness of breath and noted to be in COPD exacerbation.   1.  Acute on chronic respiratory failure. The most likely cause of respiratory failure was secondary to COPD exacerbation from acute bronchitis. Initially, when the patient presented to the hospital, he was noted to be in acute hypoxic hypercarbic respiratory  failure. The patient's ABG showed a pH of 7.14 with pCO2 116. The patient was urgently intubated and admitted to the Intensive Care Unit. The patient was started on aggressive therapy with IV steroids, around-the-clock nebulizer treatments and also given empiric antibiotics with ceftriaxone and Zithromax. The patient was maintained on the ventilator for about 3 to 4 days and then shortly after extubated. The patient has done well post extubation and has remained on BiPAP intermittently and mostly at nights. Presently, he is no longer bronchospastic and is doing fairly well. He is being discharged on an oral prednisone taper, along with finishing up treatment for underlying bronchitis with oral Levaquin. The patient was only on Advair prior to coming to the hospital, and is being discharged on Spiriva daily.  2.  Chronic obstructive pulmonary disease exacerbation. This was likely the cause of the patient's acute on chronic respiratory failure, likely secondary to acute bronchitis. The patient was treated aggressively with IV steroids, around-the-clock nebulizer treatments, and also was maintained on his Combivent and was started on Advair and Spiriva. A pulmonary consult was obtained. They agreed with this management. As mentioned above, the patient was intubated initially on hospital course and then, after 3 days of intubation, was extubated. He has required BiPAP at nights, which he will continue to require until he can be weaned off of it. He likely should follow up with the pulmonologist as an outpatient. At present, the patient is being discharged on a long prednisone taper along with empiric Levaquin for the next 3 days for treatment for his  underlying bronchitis. He will continue his maintenance inhalers including Advair and Spiriva.  3.  Acute bronchitis. As mentioned, the patient was empirically treated with Rocephin and Zithromax. His chest x-ray done on admission did not show any evidence of acute  pneumonia, although he is being discharged on oral Levaquin for a few more days.  4.  Abdominal distention and nausea. Post extubation, the patient developed some abdominal distention and had some nausea and vomiting. A KUB was obtained, did not show any evidence of ileus. The patient did have an NG tube placed intermittently. It improved with supportive care. Currently, he is tolerating p.o. well with no evidence of any abdominal pain, nausea, vomiting.  5.  Hypotension. This was likely secondary to sedation while he was intubated. It has since then resolved, as he has been extubated and off sedatives.  6. Elevated troponin. This was likely in the setting of demand ischemia from hypoxemia and respiratory failure. The patient's troponins did not trend up. His echo showed EF of 60% to 65% with no wall motion abnormalities. The patient is being discharged to a skilled nursing facility for ongoing care. He is in agreement with this plan.   Time spent on discharge is 40 minutes.   ____________________________ Christopher PancakeVivek J. Cherlynn KaiserSainani, MD vjs:dmm D: 05/14/2013 12:13:00 ET T: 05/14/2013 12:59:28 ET JOB#: [id number]  cc: Christopher PancakeVivek J. Cherlynn KaiserSainani, MD, <Dictator> Jodell Ciproennis E. Chrismon, PA [place] SirenVIVEK J Jayah Balthazar MD ELECTRONICALLY SIGNED 05/30/2013 11:27

## 2014-08-24 NOTE — Consult Note (Signed)
   Present Illness 64 yo male with history of copd admitted with rapid heart rate and noted to be in atrial flutter with variable ventricular repsone.He was admitted in early January of this year with respiratory failure requiring intubation. He had sinus tachycardia on ekgs during this admission. He was discharged to a rehab facitility. While there , he noted intermiant episodes of rapid irregular heart rates that were transient. Today noted a prolonged episode of irregular heart rate. He was brought to the er where he was noted to e in aflutter with rates up to the 140-150 range. CXR unremarkable. Has ruled out for an mi thus far. Lower extremety ultrasound negative for dvt. His heart rate is 90-100 at present on cardizem.   Physical Exam:  GEN obese   HEENT PERRL, moist oral mucosa   NECK supple   RESP no use of accessory muscles  rhonchi   CARD Irregular rate and rhythm  Tachycardic  No murmur   ABD denies tenderness  no liver/spleen enlargement   LYMPH negative axillae   EXTR negative cyanosis/clubbing, positive edema   SKIN normal to palpation   NEURO cranial nerves intact   PSYCH A+O to time, place, person   Review of Systems:  Subjective/Chief Complaint irregular heart rate   General: Fatigue   Skin: No Complaints   ENT: No Complaints   Eyes: No Complaints   Neck: No Complaints   Respiratory: Short of breath   Cardiovascular: Palpitations   Gastrointestinal: No Complaints   Genitourinary: No Complaints   Vascular: No Complaints   Musculoskeletal: No Complaints   Neurologic: No Complaints   Hematologic: No Complaints   Endocrine: No Complaints   Psychiatric: No Complaints   Review of Systems: All other systems were reviewed and found to be negative   Medications/Allergies Reviewed Medications/Allergies reviewed   EKG:  Abnormal NSSTTW changes   Interpretation atrial fib flutter    ASA: Resp. Distress, SOB   Impression 64yo male with  history of copd now admitted with afib flutter with variable ventricular response. Has ruled out for an mi. Hemodynamically stable. Likely has had intermittant afib flutter over the past few weeks. Likely secondary to his pulmonary disease. Will continue with cardizem and will likely need this at discharge. His CHADSS score is 1. Would treat with antiplatelet therapy for now. Consideration for chornic anticoagulation pedning course.   Plan 1. Rate control with cardizem 2. Continue iwth bronchodilators and oxygen therapy 3. asa for now 4. Will consider chronic anticoagulatoin after discharge based on rhythm. CHADSS 1   Electronic Signatures: Dalia HeadingFath, Kenneth A (MD)  (Signed 09-Feb-15 20:52)  Authored: General Aspect/Present Illness, History and Physical Exam, Review of System, EKG , Allergies, Impression/Plan   Last Updated: 09-Feb-15 20:52 by Dalia HeadingFath, Kenneth A (MD)

## 2014-08-24 NOTE — H&P (Signed)
PATIENT NAME:  [name], [name] MR#:  [id number] DATE OF BIRTH:  Jun 28, 1950  DATE OF ADMISSION:  06/11/2013  PRIMARY CARE PHYSICIAN: Steele Sizer, MD  The patient is a 64 year old Caucasian male with past medical history significant for history of recent admission from the 2nd of January to the 12th of January 2015 for COPD exacerbation requiring intubation, who also is on chronic oxygen therapy as well as BiPAP at night, presented to the hospital with new onset of A. fib. According to the patient, he was doing well up until the last day prior to leaving rehabilitation at [facility]. The last day before going home, his heart rate suddenly increased to 140. It lasted approximately 4 or 5 minutes. However, when his nurse came, his palpitations went away and there was nothing found so he was discharged to home. When he was discharged to home, now home he has been having problems with intermittent palpitations. His heart rate would go up as high as 150s to 160s, even higher. It would last for a few hours and it would go away. Last night, however, his palpitations came at around 6:00 p.m. and they were around 140 beats per minute. He presented to the Emergency Room for further evaluation where he was noted to be in A. flutter, and hospitalist services were contacted for admission. He denies any chest pain but admits of feeling fluttering as well as somewhat weak with those flutterings.   PAST MEDICAL HISTORY: Significant for history of admission on January 2 through the 12th of January 2015 for COPD exacerbation requiring intubation, history of chronic respiratory failure on oxygen therapy at home with 4 liters of oxygen, morbid obesity, suspected obstructive sleep apnea on BiPAP at night, CHF chronic diastolic, also elevated troponin during the same admission that was felt to be due to respiratory failure, followed by Dr. Lady Gary. Echocardiogram done on the 3rd of January 2015 revealed normal ejection  fraction, moderate increase of left ventricular posterior wall thickness. Also, mild tricuspid regurgitation was noted. Past medical history is also significant for COPD.   ALLERGIES: ASPIRIN.  PAST SURGICAL HISTORY: None.   FAMILY HISTORY: Coronary artery disease as well as diabetes.   SOCIAL HISTORY: No tobacco now. No alcohol. No illicit drugs. Married, lives with his wife at home.   REVIEW OF SYSTEMS: Positive for feeling fatigued and weak, which has seemed to be chronic since his discharge from [facility] rehabilitation. Seems to be gaining, however, strength each day. Also admits of cough as well as intermittent wheezes, as well as a small amount of clear phlegm, lower extremity swelling that seems to be improving, intermittent palpitations/arrhythmias but no chest pains.  CONSTITUTIONAL: Otherwise, denies any fevers, chills, pains, weight loss or gain.  EYES: Denies any blurry vision, double vision, glaucoma or cataracts.  EARS, NOSE, THROAT: Denies any tinnitus, allergies, epistaxis, sinus pain, dentures, difficulty swallowing.  RESPIRATORY: Denies any wheezes or asthma. History of COPD. CARDIOVASCULAR: Denies any chest pains, orthopnea or syncope. GASTROINTESTINAL: Denies any nausea, vomiting, diarrhea or constipation. GENITOURINARY: Denies any dysuria, hematuria, frequency or incontinence. ENDOCRINE: Denies any polydipsia, nocturia, thyroid problems, heat or cold intolerance or thirst. HEMATOLOGIC: Denies anemia, bleeding, swollen glands. SKIN: Denies acne, rashes, lesions or change in moles.  MUSCULOSKELETAL: Denies arthritis, cramps, swelling.  NEUROLOGICAL: No numbness, epilepsy or tremors. PSYCHIATRIC: Denies anxiety or insomnia.   MEDICATION LIST: As follows:  1.  Advair Diskus 500/50 one puff twice daily. 2.  Albuterol 2 puffs twice daily as needed.  3.  DuoNebs 2.5/0.5 mg in 3 mL inhalation solution, 3 mL 4 times daily.  4.  Furosemide 40 mg p.o. twice daily.  5.   Ibuprofen 400 mg 3 times daily as needed.  6.  Potassium chloride 10 mEq twice daily.  7.  Roxanol 20 mg every 4 hours as needed.  8.  Singulair 10 mg at bedtime.  9.  Spiriva 18 mcg inhalation daily. 10.  Zantac 150 mg p.o. twice daily.   PHYSICAL EXAMINATION:  VITAL SIGNS: On arrival to the emergency room, the patient's temperature was 98. Pulse was 77. Respiration rate was 20, blood pressure 120/88. Saturation was 97% on room air. GENERAL: This is a well-developed, obese male sitting on the stretcher.  HEENT: His pupils are equal and reactive to light. Extraocular movements intact. No icterus or conjunctivitis. Has normal hearing. No pharyngeal erythema. Mucosa is moist.  NECK: No masses. Supple, nontender. Thyroid is not enlarged. No adenopathy. No JVD or carotid bruits bilaterally. Full range of motion.  LUNGS: Clear to auscultation, though diminished breath sounds bilaterally, especially on the left. No rales, rhonchi, or wheezing. No labored inspirations, increased effort, dullness to percussion, overt respiratory distress.  CARDIOVASCULAR: S1, S2 appreciated. There are no murmurs or gallops. Rhythm is irregularly irregular. PMI not lateralized. Chest is nontender to palpation. Pedal pulses 1+. Trace to 1+ lower extremity edema. No calf tenderness or cyanosis bilaterally. ABDOMEN: Soft, nontender. Bowel sounds were present. No hepatosplenomegaly or masses were noted.  RECTAL: Deferred.  MUSCULOSKELETAL: Muscle strength: Able to move all extremities. No cyanosis, degenerative joint disease or kyphosis. Gait was not tested.  SKIN: Did not reveal any rashes, lesions, erythema, nodularity or induration. It was warm and dry to palpation.  LYMPH: No adenopathy in the cervical region.  NEUROLOGICAL: Cranial nerves grossly intact. Sensory is intact. No dysarthria or aphasia. The patient is alert, oriented to time, person and place, cooperative. Memory is good.  PSYCHIATRIC: No significant  confusion, agitation or depression noted.   EKG revealed A. flutter with variable AV block at rate of 102 beats per minute. No acute ST-T changes were noted.   LABORATORY DATA: BMP showed glucose of 100, otherwise unremarkable. Troponin less than 0.02. White blood cell count 16.4, hemoglobin 12.1, platelet count 341. Coagulation panel was within normal limits.   RADIOLOGIC STUDIES: Chest x-ray PA and lateral showed no acute abnormality. CT scan of chest done on the 9th of February 2015 on the day of admission revealed no pulmonary embolus. Underlying centrilobular emphysema with mild bibasilar atelectasis, but no consolidation. Fatty liver was also noted.   ASSESSMENT AND PLAN: 1.  Atrial flutter: Initiate the patient on Cardizem CD with hopes that he is going to convert. Will get also cardiologist involved and pulmonary to see if there are any other options for this patient to keep him in normal sinus rhythm, including possibly CPAP while he is resting. Will check also TSH and cardiac enzymes x 3. Will continue the patient on Lovenox for now.  2.  Hyperglycemia: Check hemoglobin A1c.  3.  History of chronic obstructive pulmonary disease with chronic respiratory failure: Seems to be stable. Continue his outpatient medications.  4.  Lower extremity swelling due to right-sided heart failure: Will get Dopplers of lower extremities to rule out deep vein thrombosis.   TIME SPENT: 50 minutes.    ____________________________ Katharina Caper, MD rv:jcm D: 06/11/2013 16:12:09 ET T: 06/11/2013 17:34:03 ET JOB#: [id number]  cc: Katharina Caper, MD, <Dictator> Steele Sizer, MD  Traves Majchrzak MD ELECTRONICALLY SIGNED 07/04/2013 20:30

## 2014-09-04 DIAGNOSIS — J9611 Chronic respiratory failure with hypoxia: Secondary | ICD-10-CM | POA: Diagnosis not present

## 2014-09-04 LAB — CBC AND DIFFERENTIAL
HEMATOCRIT: 49 % (ref 41–53)
Hemoglobin: 16.1 g/dL (ref 13.5–17.5)
NEUTROS ABS: 83 /uL
PLATELETS: 252 10*3/uL (ref 150–399)
WBC: 16 10^3/mL

## 2014-09-16 DIAGNOSIS — J449 Chronic obstructive pulmonary disease, unspecified: Secondary | ICD-10-CM | POA: Diagnosis not present

## 2014-09-16 DIAGNOSIS — G4733 Obstructive sleep apnea (adult) (pediatric): Secondary | ICD-10-CM | POA: Diagnosis not present

## 2014-09-16 DIAGNOSIS — J431 Panlobular emphysema: Secondary | ICD-10-CM | POA: Diagnosis not present

## 2014-09-16 DIAGNOSIS — J9611 Chronic respiratory failure with hypoxia: Secondary | ICD-10-CM | POA: Diagnosis not present

## 2014-09-25 DIAGNOSIS — I5032 Chronic diastolic (congestive) heart failure: Secondary | ICD-10-CM | POA: Diagnosis not present

## 2014-09-25 DIAGNOSIS — I482 Chronic atrial fibrillation: Secondary | ICD-10-CM | POA: Diagnosis not present

## 2014-09-25 DIAGNOSIS — I481 Persistent atrial fibrillation: Secondary | ICD-10-CM | POA: Diagnosis not present

## 2014-09-25 DIAGNOSIS — J42 Unspecified chronic bronchitis: Secondary | ICD-10-CM | POA: Diagnosis not present

## 2014-10-03 DIAGNOSIS — R5383 Other fatigue: Secondary | ICD-10-CM | POA: Insufficient documentation

## 2014-10-03 DIAGNOSIS — R609 Edema, unspecified: Secondary | ICD-10-CM | POA: Insufficient documentation

## 2014-10-03 DIAGNOSIS — R600 Localized edema: Secondary | ICD-10-CM | POA: Insufficient documentation

## 2014-10-03 DIAGNOSIS — I4891 Unspecified atrial fibrillation: Secondary | ICD-10-CM | POA: Insufficient documentation

## 2014-10-03 DIAGNOSIS — R209 Unspecified disturbances of skin sensation: Secondary | ICD-10-CM | POA: Insufficient documentation

## 2014-10-03 DIAGNOSIS — M47812 Spondylosis without myelopathy or radiculopathy, cervical region: Secondary | ICD-10-CM | POA: Insufficient documentation

## 2014-10-03 DIAGNOSIS — J449 Chronic obstructive pulmonary disease, unspecified: Secondary | ICD-10-CM | POA: Insufficient documentation

## 2014-10-03 DIAGNOSIS — J9610 Chronic respiratory failure, unspecified whether with hypoxia or hypercapnia: Secondary | ICD-10-CM | POA: Insufficient documentation

## 2014-10-03 DIAGNOSIS — G4733 Obstructive sleep apnea (adult) (pediatric): Secondary | ICD-10-CM | POA: Insufficient documentation

## 2014-10-03 DIAGNOSIS — J439 Emphysema, unspecified: Secondary | ICD-10-CM | POA: Insufficient documentation

## 2014-10-03 DIAGNOSIS — Z8679 Personal history of other diseases of the circulatory system: Secondary | ICD-10-CM | POA: Insufficient documentation

## 2014-10-03 DIAGNOSIS — J441 Chronic obstructive pulmonary disease with (acute) exacerbation: Secondary | ICD-10-CM | POA: Insufficient documentation

## 2014-10-04 ENCOUNTER — Other Ambulatory Visit: Payer: Self-pay

## 2014-10-04 ENCOUNTER — Encounter: Payer: Self-pay | Admitting: Family Medicine

## 2014-10-04 ENCOUNTER — Ambulatory Visit (INDEPENDENT_AMBULATORY_CARE_PROVIDER_SITE_OTHER): Payer: Medicare Other | Admitting: Family Medicine

## 2014-10-04 VITALS — BP 122/74 | HR 76 | Temp 98.5°F | Resp 20 | Ht 69.0 in | Wt 305.0 lb

## 2014-10-04 DIAGNOSIS — J449 Chronic obstructive pulmonary disease, unspecified: Secondary | ICD-10-CM | POA: Diagnosis not present

## 2014-10-04 MED ORDER — AZITHROMYCIN 250 MG PO TABS: ORAL_TABLET | ORAL | Status: DC

## 2014-10-04 MED ORDER — MONTELUKAST SODIUM 10 MG PO TABS: 10.0000 mg | ORAL_TABLET | Freq: Every day | ORAL | Status: DC

## 2014-10-04 NOTE — Progress Notes (Signed)
Subjective:     Patient ID: [name], male   DOB: 1951-01-02, 64 y.o.   MRN: [id number]  Cough This is a chronic problem. The current episode started more than 1 month ago. The problem has been unchanged. The problem occurs constantly. The cough is productive of sputum. Associated symptoms include nasal congestion, postnasal drip and wheezing. Pertinent negatives include no chest pain, chills, ear congestion, ear pain, fever, heartburn, sore throat or sweats. Nothing aggravates the symptoms. He has tried ipratropium inhaler and steroid inhaler for the symptoms. The treatment provided moderate relief. His past medical history is significant for bronchitis and COPD.   Patient is here for a F/U of COPD exacerbation.  Labs on 09/04/2014 had elevated WBCs at 16.0. Patient reports that he still is coughing up a moderate amount of green sputum. Denies any blood tinged sputum. He is currently on 2L of O2.   Review of Systems  Constitutional: Negative for fever and chills.  HENT: Positive for postnasal drip. Negative for ear pain and sore throat.   Respiratory: Positive for cough and wheezing.   Cardiovascular: Negative for chest pain.  Gastrointestinal: Negative for heartburn.   Previous Medications   ALBUTEROL (PROVENTIL HFA;VENTOLIN HFA) 108 (90 BASE) MCG/ACT INHALER    Inhale 2 puffs into the lungs 2 (two) times daily.   ASPIRIN 81 MG TABLET    Take 1 tablet by mouth daily.   BIOFLAVONOID PRODUCTS PO    Take 1 tablet by mouth daily.   CORAL CALCIUM 1000 (390 CA) MG TABS    Take 1 tablet by mouth daily.   DILTIAZEM (TIAZAC) 240 MG 24 HR CAPSULE    Take 1 capsule by mouth daily.   FLUTICASONE-SALMETEROL (ADVAIR) 500-50 MCG/DOSE AEPB    Inhale 1 puff into the lungs 2 (two) times daily.   FUROSEMIDE (LASIX) 40 MG TABLET    Take 1 tablet by mouth 2 (two) times daily.   IBUPROFEN (ADVIL,MOTRIN) 200 MG TABLET    Take 1 tablet by mouth as needed.   IBUPROFEN (ADVIL,MOTRIN) 400 MG TABLET    Take 1  tablet by mouth 3 (three) times daily.   IPRATROPIUM-ALBUTEROL (COMBIVENT) 20-100 MCG/ACT AERS RESPIMAT    Inhale 1 puff into the lungs 4 (four) times daily.   IPRATROPIUM-ALBUTEROL (DUONEB) 0.5-2.5 (3) MG/3ML SOLN    1 ampule 4 (four) times daily as needed.   METOPROLOL TARTRATE (LOPRESSOR) 25 MG TABLET    Take 1 tablet by mouth 2 (two) times daily.   MORPHINE 20 MG/5ML SOLUTION    Take 20 mg by mouth every 4 (four) hours as needed.   MULTIPLE VITAMIN PO    Take 1 tablet by mouth daily.   NAPROXEN SODIUM (ANAPROX) 220 MG TABLET    Take 1 tablet by mouth as needed.   POTASSIUM CHLORIDE (MICRO-K) 10 MEQ CR CAPSULE    Take 1 capsule by mouth as needed. When using lasix   POTASSIUM CHLORIDE SA (K-DUR,KLOR-CON) 20 MEQ TABLET    Take 1 tablet by mouth daily.   RANITIDINE (ZANTAC) 150 MG TABLET    Take 1 tablet by mouth every 12 (twelve) hours.   TUDORZA PRESSAIR 400 MCG/ACT AEPB       History   Social History  . Marital Status: Married    Spouse Name: N/A  . Number of Children: N/A  . Years of Education: N/A   Social History Main Topics  . Smoking status: Former Games developer  . Smokeless tobacco: Not on file  .  Alcohol Use: No  . Drug Use: No  . Sexual Activity: Not on file   Other Topics Concern  . None   Social History Narrative   Family History  Problem Relation Age of Onset  . Heart disease Mother   . Congestive Heart Failure Father    Allergies  Allergen Reactions  . Aspirin     Other reaction(s): Asthma flare up  . Sulfa Antibiotics           Objective:   Physical Exam  Constitutional:  Morbid obesity  HENT:  Head: Normocephalic and atraumatic.  Right Ear: External ear normal.  Left Ear: External ear normal.  Nose: Rhinorrhea present. Right sinus exhibits no maxillary sinus tenderness and no frontal sinus tenderness. Left sinus exhibits no maxillary sinus tenderness and no frontal sinus tenderness.  Eyes: Conjunctivae and EOM are normal. Pupils are equal, round,  and reactive to light.  Neck: Normal range of motion. Neck supple.  Cardiovascular: Normal rate and regular rhythm.   Pulmonary/Chest: He has wheezes. He has no rhonchi. He has no rales.  Dyspnea with very little exertion. Still on 2 LPM O2 24 hours a day.  Abdominal: Soft. Bowel sounds are normal.  Morbid obesity.  Lymphadenopathy:       Head (right side): No submandibular adenopathy present.       Head (left side): No submandibular adenopathy present.       Right axillary: No lateral adenopathy present.       Left axillary: No lateral adenopathy present.  BP 122/74 mmHg  Pulse 76  Temp(Src) 98.5 F (36.9 C)  Resp 20  Ht 5\' 9"  (1.753 m)  Wt 305 lb (138.347 kg)  BMI 45.02 kg/m2  SpO2 95%     Assessment:    1. COPD (chronic obstructive pulmonary disease) with chronic bronchitis Recurrence of cough and purulent sputum yesterday. No fever with post nasal drip, nasal congestion and wheezing. CBC on 09-04-14 showed WBC count 16,000 with elevated neutrophils. Responded to Z-pak a month ago until this recurrence. Continues to use inhalation therapy with control of symptoms  - CBC w/Diff    Plan:    Will treat with Z-pak again and may add Mucinex-DM. Continue present pulmonary regimen and increase fluid intake. Recheck pending lab report.

## 2014-10-05 LAB — CBC WITH DIFFERENTIAL/PLATELET
BASOS ABS: 0.1 10*3/uL (ref 0.0–0.2)
Basos: 0 %
EOS (ABSOLUTE): 0.1 10*3/uL (ref 0.0–0.4)
EOS: 0 %
Hematocrit: 44.7 % (ref 37.5–51.0)
Hemoglobin: 15 g/dL (ref 12.6–17.7)
Immature Grans (Abs): 0 10*3/uL (ref 0.0–0.1)
Immature Granulocytes: 0 %
Lymphocytes Absolute: 1.5 10*3/uL (ref 0.7–3.1)
Lymphs: 10 %
MCH: 31.5 pg (ref 26.6–33.0)
MCHC: 33.6 g/dL (ref 31.5–35.7)
MCV: 94 fL (ref 79–97)
MONOCYTES: 5 %
Monocytes Absolute: 0.7 10*3/uL (ref 0.1–0.9)
NEUTROS PCT: 85 %
Neutrophils Absolute: 12 10*3/uL — ABNORMAL HIGH (ref 1.4–7.0)
Platelets: 209 10*3/uL (ref 150–379)
RBC: 4.76 x10E6/uL (ref 4.14–5.80)
RDW: 14.3 % (ref 12.3–15.4)
WBC: 14.3 10*3/uL — ABNORMAL HIGH (ref 3.4–10.8)

## 2014-10-07 ENCOUNTER — Telehealth: Payer: Self-pay

## 2014-10-07 NOTE — Telephone Encounter (Signed)
-----   Message from Tamsen Roersennis E Chrismon, [place] sent at 10/07/2014  1:23 PM EDT ----- Improvement in WBC count but neutrophils still elevated. Proceed with antibiotic treatment of chronic bronchitis and recheck in 2 weeks.

## 2014-10-07 NOTE — Telephone Encounter (Signed)
Patient advised as directed below. Patient verbalized understand and agrees with treatment plan.

## 2014-10-10 ENCOUNTER — Other Ambulatory Visit: Payer: Self-pay | Admitting: Family Medicine

## 2014-10-28 ENCOUNTER — Other Ambulatory Visit: Payer: Self-pay

## 2014-10-28 ENCOUNTER — Telehealth: Payer: Self-pay

## 2014-10-28 ENCOUNTER — Telehealth: Payer: Self-pay | Admitting: Family Medicine

## 2014-10-28 DIAGNOSIS — D72829 Elevated white blood cell count, unspecified: Secondary | ICD-10-CM

## 2014-10-28 NOTE — Telephone Encounter (Signed)
Patient advised to keep follow up appointment scheduled for 7/1. Patient would like to get labs done before appointment. Please advise.

## 2014-10-28 NOTE — Telephone Encounter (Signed)
Pt scheduled a F/U for 11/01/14 to discuss rechecking his labs. Pt wasn't sure if Maurine Minister needs to see him or if he should just pick up a lab slip. If he just needs a lab slip his appt for 11/01/14 needs to be canceled. Thanks TNP

## 2014-10-28 NOTE — Telephone Encounter (Signed)
Please advise 

## 2014-10-28 NOTE — Telephone Encounter (Signed)
Patient advised as directed below. 

## 2014-10-28 NOTE — Telephone Encounter (Signed)
With elevation of WBC count 2 weeks ago, would like to check temperature, listen to lungs, get pulse oximetry and recheck the CBC. Please keep appointment.

## 2014-10-28 NOTE — Telephone Encounter (Signed)
Will order CBC with diff to get done before appointment on 11-01-14.

## 2014-10-28 NOTE — Telephone Encounter (Signed)
Patient advised as directed below. Patient verbalized understanding.  

## 2014-10-29 DIAGNOSIS — D72829 Elevated white blood cell count, unspecified: Secondary | ICD-10-CM | POA: Diagnosis not present

## 2014-10-30 LAB — CBC WITH DIFFERENTIAL/PLATELET
BASOS ABS: 0.1 10*3/uL (ref 0.0–0.2)
Basos: 0 %
EOS (ABSOLUTE): 0.1 10*3/uL (ref 0.0–0.4)
Eos: 1 %
Hematocrit: 42.2 % (ref 37.5–51.0)
Hemoglobin: 13.8 g/dL (ref 12.6–17.7)
Immature Grans (Abs): 0 10*3/uL (ref 0.0–0.1)
Immature Granulocytes: 0 %
Lymphocytes Absolute: 1.9 10*3/uL (ref 0.7–3.1)
Lymphs: 14 %
MCH: 31.2 pg (ref 26.6–33.0)
MCHC: 32.7 g/dL (ref 31.5–35.7)
MCV: 96 fL (ref 79–97)
MONOS ABS: 0.8 10*3/uL (ref 0.1–0.9)
Monocytes: 6 %
NEUTROS ABS: 10.4 10*3/uL — AB (ref 1.4–7.0)
NEUTROS PCT: 79 %
PLATELETS: 237 10*3/uL (ref 150–379)
RBC: 4.42 x10E6/uL (ref 4.14–5.80)
RDW: 15.2 % (ref 12.3–15.4)
WBC: 13.3 10*3/uL — ABNORMAL HIGH (ref 3.4–10.8)

## 2014-10-31 ENCOUNTER — Telehealth: Payer: Self-pay

## 2014-10-31 NOTE — Telephone Encounter (Signed)
Patient advised as directed below. Patient verbalized understanding.  

## 2014-10-31 NOTE — Telephone Encounter (Signed)
-----   Message from Tamsen Roersennis E Chrismon, [place] sent at 10/31/2014 12:59 AM EDT ----- Improvement in WBC count compared to 3 weeks ago. Keep follow up appointment Friday.

## 2014-11-01 ENCOUNTER — Encounter: Payer: Self-pay | Admitting: Family Medicine

## 2014-11-01 ENCOUNTER — Ambulatory Visit (INDEPENDENT_AMBULATORY_CARE_PROVIDER_SITE_OTHER): Payer: Medicare Other | Admitting: Family Medicine

## 2014-11-01 VITALS — BP 132/96 | HR 128 | Temp 98.3°F | Resp 20 | Wt 315.0 lb

## 2014-11-01 DIAGNOSIS — J9611 Chronic respiratory failure with hypoxia: Secondary | ICD-10-CM

## 2014-11-01 DIAGNOSIS — J441 Chronic obstructive pulmonary disease with (acute) exacerbation: Secondary | ICD-10-CM

## 2014-11-01 DIAGNOSIS — J449 Chronic obstructive pulmonary disease, unspecified: Secondary | ICD-10-CM

## 2014-11-01 NOTE — Progress Notes (Signed)
Subjective:    Patient ID: [name], male    DOB: Sep 06, 1950, 64 y.o.   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 No further purulent sputum production over the past week. Took Z-pak and the week after finishing the tablets the congestion cleared. Breathing stable and well controlled with inhalers and oxygen a 2LPM. With activities, pulse oximetry at home can drop as low as 78% but promptly rebounds to 93% with rest and oxygen. No fevers or nasal congestion. Recheck of CBC recently showed WBC dropping back toward normal. Feels chronic respiratory failure is stable. Patient Active Problem List   Diagnosis Date Noted  . A-fib 10/03/2014  . Chronic asthmatic bronchitis with acute exacerbation 10/03/2014  . Chronic respiratory failure 10/03/2014  . CAFL (chronic airflow limitation) 10/03/2014  . Cervical osteoarthritis 10/03/2014  . Chronic obstructive pulmonary emphysema 10/03/2014  . Fatigue 10/03/2014  . History of atrial fibrillation 10/03/2014  . Obstructive apnea 10/03/2014  . Disturbance of skin sensation 10/03/2014  . Edema, peripheral 10/03/2014  . CCF (congestive cardiac failure) 03/26/2014  . Carbuncle and furuncle of buttock 12/04/2009  . Elevated WBC count 01/09/2009  . Hypoxemia 12/10/2008  . Chronic obstructive asthma with status asthmaticus 03/31/2006  . Accumulation of fluid in tissues 03/31/2006  . Esophagitis, reflux 03/31/2006  . Decreased potassium in the blood 03/31/2006  . Adiposity 03/31/2006  . Primary localized osteoarthrosis, lower leg 03/31/2006  . Apnea, sleep 03/31/2006   Past Surgical History  Procedure Laterality Date  . Vasectomy  1979  . Appendectomy  1974  . Cholecystectomy  1974  . Tonsillectomy and adenoidectomy  1970  . Nasal sinus surgery  [phone]   History  Substance Use Topics  . Smoking status: Former Games developermoker  . Smokeless tobacco: Not on file  .  Alcohol Use: No   Family History  Problem Relation Age of Onset  . Heart disease Mother   . Congestive Heart Failure Father    Current Outpatient Prescriptions on File Prior to Visit  Medication Sig Dispense Refill  . ADVAIR DISKUS 500-50 MCG/DOSE AEPB INHALE 1 PUFF TWICE DAILY.  RINSE MOUTH AFTER EACH USE 60 each 6  . albuterol (PROVENTIL HFA;VENTOLIN HFA) 108 (90 BASE) MCG/ACT inhaler Inhale 2 puffs into the lungs 2 (two) times daily.    Marland Kitchen. aspirin 81 MG tablet Take 1 tablet by mouth daily.    Marland Kitchen. BIOFLAVONOID PRODUCTS PO Take 1 tablet by mouth daily.    . Coral Calcium 1000 (390 CA) MG TABS Take 1 tablet by mouth daily.    Marland Kitchen. diltiazem (TIAZAC) 240 MG 24 hr capsule Take 1 capsule by mouth daily.    . furosemide (LASIX) 40 MG tablet Take 1 tablet by mouth 2 (two) times daily.    Marland Kitchen. ibuprofen (ADVIL,MOTRIN) 200 MG tablet Take 1 tablet by mouth as needed.    Marland Kitchen. ibuprofen (ADVIL,MOTRIN) 400 MG tablet Take 1 tablet by mouth 3 (three) times daily.    . Ipratropium-Albuterol (COMBIVENT) 20-100 MCG/ACT AERS respimat Inhale 1 puff into the lungs 4 (four) times daily.    Marland Kitchen. ipratropium-albuterol (DUONEB) 0.5-2.5 (3) MG/3ML SOLN 1 ampule 4 (four) times daily as needed.    . metoprolol tartrate (LOPRESSOR) 25 MG tablet Take 1 tablet by mouth 2 (two) times daily.    . montelukast (SINGULAIR) 10 MG tablet Take 1 tablet (10 mg total) by mouth at bedtime. 30 tablet 6  . morphine 20 MG/5ML  solution Take 20 mg by mouth every 4 (four) hours as needed.    . MULTIPLE VITAMIN PO Take 1 tablet by mouth daily.    . naproxen sodium (ANAPROX) 220 MG tablet Take 1 tablet by mouth as needed.    . potassium chloride (MICRO-K) 10 MEQ CR capsule Take 1 capsule by mouth as needed. When using lasix    . potassium chloride SA (K-DUR,KLOR-CON) 20 MEQ tablet Take 1 tablet by mouth daily.    . ranitidine (ZANTAC) 150 MG tablet Take 1 tablet by mouth every 12 (twelve) hours.    Carlos American PRESSAIR 400 MCG/ACT AEPB      No  current facility-administered medications on file prior to visit.   Allergies  Allergen Reactions  . Aspirin     Other reaction(s): Asthma flare up  . Sulfa Antibiotics    Review of Systems  Constitutional: Negative.   HENT: Negative.   Eyes: Negative.   Respiratory: Positive for shortness of breath.        Controlled by oxygen and inhaler therapy.  Cardiovascular: Negative.   Gastrointestinal: Negative.      BP 132/96 mmHg  Pulse 128  Temp(Src) 98.3 F (36.8 C) (Oral)  Resp 20  Wt 315 lb (142.883 kg)  SpO2 96%  Objective:   Physical Exam  Constitutional: He is oriented to person, place, and time. He appears well-developed and well-nourished. No distress.  HENT:  Head: Normocephalic and atraumatic.  Right Ear: Hearing and external ear normal.  Left Ear: Hearing and external ear normal.  Nose: Nose normal.  Mouth/Throat: Oropharynx is clear and moist.  Eyes: Conjunctivae, EOM and lids are normal. Right eye exhibits no discharge. Left eye exhibits no discharge. No scleral icterus.  Cardiovascular: Normal rate and regular rhythm.   Pulmonary/Chest: Breath sounds normal. No respiratory distress.  Musculoskeletal: Normal range of motion.  Neurological: He is alert and oriented to person, place, and time.  Skin: Skin is intact. No lesion and no rash noted.  Psychiatric: He has a normal mood and affect. His speech is normal and behavior is normal. Thought content normal.      Assessment & Plan:  1. Chronic asthmatic bronchitis with acute exacerbation Improved and very little cough remaining today. Sputum has changed back to clear to white after taking the antibiotic. May continue expectorant prn. Continue inhaler therapy and oxygen. Recheck 3 months.  CBC Latest Ref Rng 10/29/2014 10/04/2014 09/04/2014  WBC 3.4 - 10.8 x10E3/uL 13.3(H) 14.3(H) 16.0  Hemoglobin 13.5 - 17.5 g/dL - - 54.0  Hematocrit 98.1 - 51.0 % 42.2 44.7 49  Platelets 150 - 399 K/L - - 252    2. Chronic  respiratory failure with hypoxia On 2-3 LPM of oxygen by nasal cannula, pulse oximetry 96% when sitting at rest. Continue BIPAP with oxygen bleed in at night for sleep apnea, also. Follow up with Dr. Welton Flakes as planned.

## 2014-12-02 ENCOUNTER — Other Ambulatory Visit: Payer: Self-pay | Admitting: Family Medicine

## 2014-12-02 DIAGNOSIS — J449 Chronic obstructive pulmonary disease, unspecified: Secondary | ICD-10-CM

## 2014-12-05 ENCOUNTER — Other Ambulatory Visit: Payer: Self-pay | Admitting: Family Medicine

## 2014-12-05 NOTE — Telephone Encounter (Signed)
Last ov was on 11/01/2014.

## 2014-12-17 DIAGNOSIS — J449 Chronic obstructive pulmonary disease, unspecified: Secondary | ICD-10-CM | POA: Diagnosis not present

## 2014-12-17 DIAGNOSIS — G4733 Obstructive sleep apnea (adult) (pediatric): Secondary | ICD-10-CM | POA: Diagnosis not present

## 2014-12-17 DIAGNOSIS — J9611 Chronic respiratory failure with hypoxia: Secondary | ICD-10-CM | POA: Diagnosis not present

## 2015-01-04 ENCOUNTER — Other Ambulatory Visit: Payer: Self-pay | Admitting: Family Medicine

## 2015-01-23 ENCOUNTER — Ambulatory Visit: Payer: Medicare Other | Admitting: Family Medicine

## 2015-01-29 DIAGNOSIS — R0602 Shortness of breath: Secondary | ICD-10-CM | POA: Diagnosis not present

## 2015-03-04 ENCOUNTER — Other Ambulatory Visit: Payer: Self-pay | Admitting: Family Medicine

## 2015-03-24 ENCOUNTER — Other Ambulatory Visit: Payer: Self-pay | Admitting: Family Medicine

## 2015-03-25 DIAGNOSIS — J9611 Chronic respiratory failure with hypoxia: Secondary | ICD-10-CM | POA: Diagnosis not present

## 2015-03-25 DIAGNOSIS — G4733 Obstructive sleep apnea (adult) (pediatric): Secondary | ICD-10-CM | POA: Diagnosis not present

## 2015-03-25 DIAGNOSIS — J449 Chronic obstructive pulmonary disease, unspecified: Secondary | ICD-10-CM | POA: Diagnosis not present

## 2015-03-26 DIAGNOSIS — I481 Persistent atrial fibrillation: Secondary | ICD-10-CM | POA: Diagnosis not present

## 2015-03-26 DIAGNOSIS — E876 Hypokalemia: Secondary | ICD-10-CM | POA: Diagnosis not present

## 2015-05-15 ENCOUNTER — Other Ambulatory Visit: Payer: Self-pay | Admitting: Family Medicine

## 2015-06-25 ENCOUNTER — Other Ambulatory Visit: Payer: Self-pay | Admitting: Family Medicine

## 2015-07-11 ENCOUNTER — Other Ambulatory Visit: Payer: Self-pay | Admitting: Family Medicine

## 2015-07-29 DIAGNOSIS — J441 Chronic obstructive pulmonary disease with (acute) exacerbation: Secondary | ICD-10-CM | POA: Diagnosis not present

## 2015-07-29 DIAGNOSIS — I482 Chronic atrial fibrillation: Secondary | ICD-10-CM | POA: Diagnosis not present

## 2015-07-29 DIAGNOSIS — R0602 Shortness of breath: Secondary | ICD-10-CM | POA: Diagnosis not present

## 2015-07-29 DIAGNOSIS — J9611 Chronic respiratory failure with hypoxia: Secondary | ICD-10-CM | POA: Diagnosis not present

## 2015-08-14 DIAGNOSIS — I482 Chronic atrial fibrillation: Secondary | ICD-10-CM | POA: Diagnosis not present

## 2015-08-14 DIAGNOSIS — J9611 Chronic respiratory failure with hypoxia: Secondary | ICD-10-CM | POA: Diagnosis not present

## 2015-08-14 DIAGNOSIS — J449 Chronic obstructive pulmonary disease, unspecified: Secondary | ICD-10-CM | POA: Diagnosis not present

## 2015-08-19 ENCOUNTER — Other Ambulatory Visit: Payer: Self-pay | Admitting: Family Medicine

## 2015-08-29 ENCOUNTER — Ambulatory Visit (INDEPENDENT_AMBULATORY_CARE_PROVIDER_SITE_OTHER): Payer: Medicare Other | Admitting: Family Medicine

## 2015-08-29 ENCOUNTER — Encounter: Payer: Self-pay | Admitting: Family Medicine

## 2015-08-29 VITALS — BP 118/88 | HR 67 | Temp 98.8°F | Resp 14 | Wt 301.6 lb

## 2015-08-29 DIAGNOSIS — R6889 Other general symptoms and signs: Secondary | ICD-10-CM

## 2015-08-29 DIAGNOSIS — J9611 Chronic respiratory failure with hypoxia: Secondary | ICD-10-CM | POA: Diagnosis not present

## 2015-08-29 DIAGNOSIS — I509 Heart failure, unspecified: Secondary | ICD-10-CM | POA: Diagnosis not present

## 2015-08-29 DIAGNOSIS — Z6841 Body Mass Index (BMI) 40.0 and over, adult: Secondary | ICD-10-CM | POA: Diagnosis not present

## 2015-08-29 MED ORDER — PREDNISONE 10 MG PO TABS: ORAL_TABLET | ORAL | Status: DC

## 2015-08-29 MED ORDER — FLUTICASONE-SALMETEROL 500-50 MCG/DOSE IN AEPB: INHALATION_SPRAY | RESPIRATORY_TRACT | Status: DC

## 2015-08-29 NOTE — Progress Notes (Signed)
Patient ID: [name], male   DOB: 03-31-1951, 65 y.o.   MRN: [id number]   Patient: [name] Male    DOB: [birth year]   65 y.o.   MRN: [id number] Visit Date: 08/29/2015  Today's Provider: Dortha Kern, PA   Chief Complaint  Patient presents with  . COPD  . Neck Pain  . Follow-up   Subjective:    Neck Pain  This is a new problem. Episode onset: 6 months ago. The problem occurs intermittently. The problem has been unchanged. The pain is associated with nothing. Quality: popping. The patient is experiencing no pain. The symptoms are aggravated by twisting.    Hypertension, follow-up:  BP Readings from Last 3 Encounters:  08/29/15 118/88  11/01/14 132/96  10/04/14 122/74    He was last seen for hypertension 7 months ago.  BP at that visit was 132/96. Management changes since that visit include none. He reports good compliance with treatment. He is not having side effects.  He is not exercising. He is adherent to low salt diet.   Outside blood pressures are not being checked. He is experiencing none.  Patient denies none.   Cardiovascular risk factors include advanced age (older than 47 for men, 68 for women), hypertension, male gender and obesity (BMI >= 30 kg/m2).  Use of agents associated with hypertension: none.     Weight trend: stable Wt Readings from Last 3 Encounters:  08/29/15 301 lb 9.6 oz (136.805 kg)  11/01/14 315 lb (142.883 kg)  10/04/14 305 lb (138.347 kg)      ------------------------------------------------------------------------  History reviewed. No pertinent past medical history. Patient Active Problem List   Diagnosis Date Noted  . A-fib (HCC) 10/03/2014  . Chronic asthmatic bronchitis with acute exacerbation (HCC) 10/03/2014  . Chronic respiratory failure (HCC) 10/03/2014  . CAFL (chronic airflow limitation) (HCC) 10/03/2014  . Cervical osteoarthritis 10/03/2014  . Chronic obstructive pulmonary emphysema (HCC) 10/03/2014   . Fatigue 10/03/2014  . History of atrial fibrillation 10/03/2014  . Obstructive apnea 10/03/2014  . Disturbance of skin sensation 10/03/2014  . Edema, peripheral 10/03/2014  . CCF (congestive cardiac failure) (HCC) 03/26/2014  . Chronic obstructive pulmonary disease (HCC) 09/21/2013  . Carbuncle and furuncle of buttock 12/04/2009  . Elevated WBC count 01/09/2009  . Hypoxemia 12/10/2008  . 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 Accumulation of fluid in tissues 03/31/2006  . Esophagitis, reflux 03/31/2006  . Decreased potassium in the blood 03/31/2006  . Adiposity 03/31/2006  . Primary localized osteoarthrosis, lower leg 03/31/2006  . Apnea, sleep 03/31/2006   Past Surgical History  Procedure Laterality Date  . Vasectomy  1979  . Appendectomy  1974  . Cholecystectomy  1974  . Tonsillectomy and adenoidectomy  1970  . Nasal sinus surgery  [phone]   Family History  Problem Relation Age of Onset  . Heart disease Mother   . Congestive Heart Failure Father    Previous Medications   ADVAIR DISKUS 500-50 MCG/DOSE AEPB    INHALE 1 PUFF TWICE DAILY.  RINSE MOUTH AFTER EACH USE   ALBUTEROL (PROVENTIL HFA;VENTOLIN HFA) 108 (90 BASE) MCG/ACT INHALER    Inhale 2 puffs into the lungs 2 (two) times daily.   ASPIRIN 81 MG TABLET    Take 1 tablet by mouth daily.   BIOFLAVONOID PRODUCTS PO    Take 1 tablet by mouth daily.   COMBIVENT RESPIMAT 20-100 MCG/ACT AERS RESPIMAT    USE ONE PUFF FOUR TIMES DAILY  CORAL CALCIUM 1000 (390 CA) MG TABS    Take 1 tablet by mouth daily.   DILTIAZEM (TIAZAC) 240 MG 24 HR CAPSULE    Take 1 capsule by mouth daily.   FUROSEMIDE (LASIX) 40 MG TABLET    TAKE ONE TABLET TWICE DAILY AS NEEDED   IBUPROFEN (ADVIL,MOTRIN) 200 MG TABLET    Take 1 tablet by mouth as needed.   IBUPROFEN (ADVIL,MOTRIN) 400 MG TABLET    Take 1 tablet by mouth 3 (three) times daily.   METOPROLOL TARTRATE (LOPRESSOR) 25 MG TABLET    Take 1 tablet by  mouth 2 (two) times daily.   MONTELUKAST (SINGULAIR) 10 MG TABLET    TAKE 1 TABLET BY MOUTH AT BEDTIME   MORPHINE 20 MG/5ML SOLUTION    Take 20 mg by mouth every 4 (four) hours as needed.   MULTIPLE VITAMIN PO    Take 1 tablet by mouth daily.   NAPROXEN SODIUM (ANAPROX) 220 MG TABLET    Take 1 tablet by mouth as needed.   POTASSIUM CHLORIDE (MICRO-K) 10 MEQ CR CAPSULE    Take 1 capsule by mouth as needed. Reported on 08/29/2015   POTASSIUM CHLORIDE SA (K-DUR,KLOR-CON) 20 MEQ TABLET    Take 1 tablet by mouth daily.   PREDNISONE (DELTASONE) 10 MG TABLET    TAKE ONE (1) TABLET EACH DAY   RANITIDINE (ZANTAC) 150 MG TABLET    TAKE ONE TABLET EVERY 12 HOURS   TUDORZA PRESSAIR 400 MCG/ACT AEPB       Allergies  Allergen Reactions  . Aspirin     Other reaction(s): Asthma flare up  . Sulfa Antibiotics     Review of Systems  Constitutional: Negative.   HENT: Negative.   Eyes: Negative.   Respiratory: Positive for apnea, chest tightness, shortness of breath and wheezing.        Sleep apnea better at night with BiPAP.  Cardiovascular: Negative.   Gastrointestinal: Negative.   Endocrine: Negative.   Genitourinary: Negative.   Musculoskeletal: Neck pain: neck popping.  Skin: Negative.   Allergic/Immunologic: Negative.   Neurological: Negative.   Hematological: Negative.   Psychiatric/Behavioral: Negative.     Social History  Substance Use Topics  . Smoking status: Former Games developermoker  . Smokeless tobacco: Not on file  . Alcohol Use: No   Objective:   BP 118/88 mmHg  Pulse 67  Temp(Src) 98.8 F (37.1 C) (Oral)  Resp 14  Wt 301 lb 9.6 oz (136.805 kg) Body mass index is 44.52 kg/(m^2). Pulse oximetry 91% on 2LPM sitting still. Drops to 85% on room air (without oxygen) and sitting still. Walking very short distance (20-30 ft) without oxygen, pulse oximetry drops to 80%. Walking with oxygen at 2 LPM pulse oximetry drops to 85% with pulse up to 120 BPM and respiration rate 24.  Physical Exam    Constitutional: He is oriented to person, place, and time. He appears well-developed and well-nourished. No distress.  HENT:  Head: Normocephalic and atraumatic.  Right Ear: Hearing normal.  Left Ear: Hearing normal.  Nose: Nose normal.  Eyes: Conjunctivae, EOM and lids are normal. Right eye exhibits no discharge. Left eye exhibits no discharge. No scleral icterus.  Neck: Normal range of motion. Neck supple.  Only two clicks felt in right posterior neck when chin is on chest and rotating head right then left. No tenderness/pain.  Cardiovascular: Normal rate and regular rhythm.   Pulmonary/Chest: He is in respiratory distress.  Distant breath sounds with dyspnea.  Abdominal:  Soft. Bowel sounds are normal.  Musculoskeletal: Normal range of motion. He exhibits edema.  2-3+ up to mid calf level.  Neurological: He is alert and oriented to person, place, and time.  Skin: Skin is intact. No lesion and no rash noted.  Psychiatric: He has a normal mood and affect. His speech is normal and behavior is normal. Thought content normal.      Assessment & Plan:     1. Neck complaint Intermittent over the past 6 months. No pain, numbness or tingling. No known injury. Suspect early arthritis. Will get x-ray evaluation when he is ready. Declines today. May use Tylenol or Naproxen prn. Apply moist heat prn.  2. Chronic respiratory failure with hypoxia (HCC) Persistent dyspnea and OSA at night (on BiPAP) with oxygen 2 LPM 24 hours a day. Has had hospitalizations for acute on chronic respiratory failure requiring intubation in 2003 and 2015. Pulmonologist (Dr. Welton Flakes) continues Advair, daily prednisone,Tudorza, Singulair, Combivent Respimat and Albuterol prn. Had a history of smoking 1 ppd for 45 years before he quit in 2003. Pulse oximetry 91% on 2LPM sitting still. Drops to 85% on room air (without oxygen) and sitting still. Walking very short distance (20-30 ft) without oxygen, pulse oximetry drops to 80%.  Walking with oxygen at 2 LPM pulse oximetry drops to 85% with pulse up to 120 BPM. Must continue oxygen and BiPAP at night. Will recheck routine labs and encouraged to continue follow up with pulmonologist. - CBC with Differential/Platelet - Fluticasone-Salmeterol (ADVAIR DISKUS) 500-50 MCG/DOSE AEPB; INHALE 1 PUFF TWICE DAILY.  RINSE MOUTH AFTER EACH USE  Dispense: 60 each; Refill: 3 - predniSONE (DELTASONE) 10 MG tablet; TAKE ONE (1) TABLET EACH DAY  Dispense: 30 tablet; Refill: 3  3. Chronic congestive heart failure, unspecified congestive heart failure type (HCC) Stable with occasional peripheral edema. Continues Lasix 40 mg qd with KCL 20 meq qd and controls BP with Diltiazem 240 mg qd and Metoprolol 25 mg BID. Followed by cardiologist (Dr. Lady Gary). Recheck routine labs. - Comprehensive metabolic panel - TSH  4. Morbid obesity with BMI of 40.0-44.9, adult (HCC) Persistent morbid obesity. Continues efforts to control caloric intake but unable to exercise to any significant degree due to chronic respiratory failure with hypoxia. - Comprehensive metabolic panel - TSH  5. Obstructive sleep apnea Sleeps fairly well on BiPAP with oxygen bleed-in of 2 LPM each night.

## 2015-09-02 DIAGNOSIS — Z6841 Body Mass Index (BMI) 40.0 and over, adult: Secondary | ICD-10-CM | POA: Diagnosis not present

## 2015-09-02 DIAGNOSIS — I509 Heart failure, unspecified: Secondary | ICD-10-CM | POA: Diagnosis not present

## 2015-09-02 DIAGNOSIS — J9611 Chronic respiratory failure with hypoxia: Secondary | ICD-10-CM | POA: Diagnosis not present

## 2015-09-03 LAB — COMPREHENSIVE METABOLIC PANEL
ALK PHOS: 70 IU/L (ref 39–117)
ALT: 20 IU/L (ref 0–44)
AST: 20 IU/L (ref 0–40)
Albumin/Globulin Ratio: 1.8 (ref 1.2–2.2)
Albumin: 4.1 g/dL (ref 3.6–4.8)
BILIRUBIN TOTAL: 0.3 mg/dL (ref 0.0–1.2)
BUN/Creatinine Ratio: 19 (ref 10–24)
BUN: 13 mg/dL (ref 8–27)
CHLORIDE: 98 mmol/L (ref 96–106)
CO2: 27 mmol/L (ref 18–29)
CREATININE: 0.7 mg/dL — AB (ref 0.76–1.27)
Calcium: 9.6 mg/dL (ref 8.6–10.2)
GFR calc Af Amer: 115 mL/min/{1.73_m2} (ref >59)
GFR calc non Af Amer: 100 mL/min/{1.73_m2} (ref >59)
GLUCOSE: 123 mg/dL — AB (ref 65–99)
Globulin, Total: 2.3 g/dL (ref 1.5–4.5)
Potassium: 5.3 mmol/L — ABNORMAL HIGH (ref 3.5–5.2)
Sodium: 140 mmol/L (ref 134–144)
Total Protein: 6.4 g/dL (ref 6.0–8.5)

## 2015-09-03 LAB — CBC WITH DIFFERENTIAL/PLATELET
BASOS ABS: 0 10*3/uL (ref 0.0–0.2)
Basos: 0 %
EOS (ABSOLUTE): 0.1 10*3/uL (ref 0.0–0.4)
Eos: 1 %
Hematocrit: 46.3 % (ref 37.5–51.0)
Hemoglobin: 15.1 g/dL (ref 12.6–17.7)
Immature Grans (Abs): 0 10*3/uL (ref 0.0–0.1)
Immature Granulocytes: 0 %
LYMPHS ABS: 1.3 10*3/uL (ref 0.7–3.1)
Lymphs: 9 %
MCH: 31.7 pg (ref 26.6–33.0)
MCHC: 32.6 g/dL (ref 31.5–35.7)
MCV: 97 fL (ref 79–97)
MONOCYTES: 3 %
MONOS ABS: 0.5 10*3/uL (ref 0.1–0.9)
Neutrophils Absolute: 13.2 10*3/uL — ABNORMAL HIGH (ref 1.4–7.0)
Neutrophils: 87 %
PLATELETS: 238 10*3/uL (ref 150–379)
RBC: 4.77 x10E6/uL (ref 4.14–5.80)
RDW: 14.1 % (ref 12.3–15.4)
WBC: 15.1 10*3/uL — ABNORMAL HIGH (ref 3.4–10.8)

## 2015-09-03 LAB — TSH: TSH: 0.907 u[IU]/mL (ref 0.450–4.500)

## 2015-09-08 ENCOUNTER — Telehealth: Payer: Self-pay

## 2015-09-08 MED ORDER — AZITHROMYCIN 250 MG PO TABS: ORAL_TABLET | ORAL | Status: DC

## 2015-09-08 NOTE — Telephone Encounter (Signed)
-----   Message from Kohl'sDennis E Chrismon, [place] sent at 09/08/2015  8:29 AM EDT ----- WBC count elevated which normally indicates some bacterial infection. Normal kidney and liver function. No anemia. Recommend Azithromycin 250 mg 2 tablets today then 1 daily for 4 days #6. Recheck WBC count in 10 days.

## 2015-09-08 NOTE — Telephone Encounter (Signed)
Patient advised as directed below. Patient verbalized understanding. RX sent to pharmacy. Patient states he will call back to schedule a follow up appointment.

## 2015-09-17 ENCOUNTER — Telehealth: Payer: Self-pay | Admitting: Family Medicine

## 2015-09-17 DIAGNOSIS — D72829 Elevated white blood cell count, unspecified: Secondary | ICD-10-CM

## 2015-09-17 NOTE — Telephone Encounter (Signed)
Pt is requesting a lab slip to recheck blood count.  CB#[id number]

## 2015-09-17 NOTE — Telephone Encounter (Signed)
WBC ordered as directed in patient message on 09/08/2015. Patient is aware lab slip is at front desk for pick up.

## 2015-09-18 DIAGNOSIS — D72829 Elevated white blood cell count, unspecified: Secondary | ICD-10-CM | POA: Diagnosis not present

## 2015-09-19 LAB — CBC WITH DIFFERENTIAL
BASOS: 0 %
Basophils Absolute: 0 10*3/uL (ref 0.0–0.2)
EOS (ABSOLUTE): 0.1 10*3/uL (ref 0.0–0.4)
EOS: 1 %
HEMATOCRIT: 47 % (ref 37.5–51.0)
Hemoglobin: 15.3 g/dL (ref 12.6–17.7)
IMMATURE GRANS (ABS): 0 10*3/uL (ref 0.0–0.1)
Immature Granulocytes: 0 %
Lymphocytes Absolute: 1.2 10*3/uL (ref 0.7–3.1)
Lymphs: 8 %
MCH: 32.6 pg (ref 26.6–33.0)
MCHC: 32.6 g/dL (ref 31.5–35.7)
MCV: 100 fL — ABNORMAL HIGH (ref 79–97)
MONOS ABS: 0.7 10*3/uL (ref 0.1–0.9)
Monocytes: 4 %
NEUTROS ABS: 14.1 10*3/uL — AB (ref 1.4–7.0)
NEUTROS PCT: 87 %
RBC: 4.69 x10E6/uL (ref 4.14–5.80)
RDW: 14.1 % (ref 12.3–15.4)
WBC: 16.2 10*3/uL — AB (ref 3.4–10.8)

## 2015-09-22 ENCOUNTER — Other Ambulatory Visit: Payer: Self-pay | Admitting: Family Medicine

## 2015-09-22 ENCOUNTER — Ambulatory Visit
Admission: RE | Admit: 2015-09-22 | Discharge: 2015-09-22 | Disposition: A | Discharge status: Home / Self Care | Payer: Medicare Other | Source: Ambulatory Visit | Attending: Family Medicine | Admitting: Family Medicine

## 2015-09-22 ENCOUNTER — Telehealth: Payer: Self-pay

## 2015-09-22 DIAGNOSIS — R05 Cough: Secondary | ICD-10-CM | POA: Diagnosis not present

## 2015-09-22 DIAGNOSIS — D72829 Elevated white blood cell count, unspecified: Secondary | ICD-10-CM

## 2015-09-22 DIAGNOSIS — I517 Cardiomegaly: Secondary | ICD-10-CM | POA: Diagnosis not present

## 2015-09-22 DIAGNOSIS — R059 Cough, unspecified: Secondary | ICD-10-CM

## 2015-09-22 DIAGNOSIS — J9611 Chronic respiratory failure with hypoxia: Secondary | ICD-10-CM

## 2015-09-22 DIAGNOSIS — J984 Other disorders of lung: Secondary | ICD-10-CM

## 2015-09-22 DIAGNOSIS — R5383 Other fatigue: Secondary | ICD-10-CM

## 2015-09-22 NOTE — Telephone Encounter (Signed)
Pt advised, order for chest xray put in=aa

## 2015-09-22 NOTE — Telephone Encounter (Signed)
-----   Message from Tamsen Roersennis E Chrismon, [place] sent at 09/21/2015 11:57 PM EDT ----- WBC count higher than 2 weeks ago. Recommend chest x-ray and check of urinalysis to rule out pneumonia or urinary tract infection.

## 2015-09-24 ENCOUNTER — Telehealth: Payer: Self-pay | Admitting: Family Medicine

## 2015-09-24 LAB — URINE CULTURE: Organism ID, Bacteria: NO GROWTH

## 2015-09-24 LAB — PLEASE NOTE

## 2015-09-24 NOTE — Telephone Encounter (Signed)
Pt would like to have results today if possible.  Pt is very nervous about results.  Dennis's patient  Thanks Barth Kirkseri

## 2015-09-24 NOTE — Telephone Encounter (Signed)
Advised patient of results.  

## 2015-09-24 NOTE — Telephone Encounter (Signed)
Pt would like to get results from chest X ray

## 2015-09-30 ENCOUNTER — Ambulatory Visit
Admission: RE | Admit: 2015-09-30 | Discharge: 2015-09-30 | Disposition: A | Discharge status: Home / Self Care | Payer: Medicare Other | Source: Ambulatory Visit | Attending: Family Medicine | Admitting: Family Medicine

## 2015-09-30 DIAGNOSIS — J984 Other disorders of lung: Secondary | ICD-10-CM | POA: Insufficient documentation

## 2015-09-30 DIAGNOSIS — J432 Centrilobular emphysema: Secondary | ICD-10-CM | POA: Insufficient documentation

## 2015-09-30 DIAGNOSIS — R93422 Abnormal radiologic findings on diagnostic imaging of left kidney: Secondary | ICD-10-CM | POA: Diagnosis not present

## 2015-09-30 DIAGNOSIS — J9611 Chronic respiratory failure with hypoxia: Secondary | ICD-10-CM | POA: Insufficient documentation

## 2015-10-05 ENCOUNTER — Other Ambulatory Visit: Payer: Self-pay | Admitting: Family Medicine

## 2015-10-05 DIAGNOSIS — N281 Cyst of kidney, acquired: Secondary | ICD-10-CM

## 2015-10-05 DIAGNOSIS — J432 Centrilobular emphysema: Secondary | ICD-10-CM

## 2015-10-06 ENCOUNTER — Telehealth: Payer: Self-pay | Admitting: Family Medicine

## 2015-10-06 NOTE — Telephone Encounter (Signed)
Pt would like to get results from CT scan before making urology referral

## 2015-10-06 NOTE — Telephone Encounter (Signed)
Recommend he see Dr. Welton FlakesKhan in the next month to go over CT scan findings. Will try to set up urology referral with someone in his insurance network and locally if possible.

## 2015-10-06 NOTE — Telephone Encounter (Signed)
Sarah, please review. Can you schedule a urologist that would be approved through his insurance? Thanks!

## 2015-10-06 NOTE — Telephone Encounter (Signed)
Advised patient as below. Patient reports that he already sees Dr. Welton FlakesKhan for his lung issues (I did not ask when his last appt was, does he need to F/U with him?). Also, patient wants to know which urologist do you recommend that he sees for his kidney issue? Please advise. Thanks!

## 2015-10-06 NOTE — Telephone Encounter (Signed)
-----   Message from Jodell CiproDennis E Hollandhrismon, [place]PA sent at 10/05/2015 10:36 PM EDT ----- CT scan of chest showed diffuse centrilobular emphysema with probable pulmonary arterial hypertension. Recommend evaluation by pulmonologist. 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 Recommend evaluation by urologist. (Placed orders in chart for referrals).

## 2015-10-15 DIAGNOSIS — I482 Chronic atrial fibrillation: Secondary | ICD-10-CM | POA: Diagnosis not present

## 2015-10-15 DIAGNOSIS — J9611 Chronic respiratory failure with hypoxia: Secondary | ICD-10-CM | POA: Diagnosis not present

## 2015-10-15 DIAGNOSIS — G4733 Obstructive sleep apnea (adult) (pediatric): Secondary | ICD-10-CM | POA: Diagnosis not present

## 2015-10-15 DIAGNOSIS — J432 Centrilobular emphysema: Secondary | ICD-10-CM | POA: Diagnosis not present

## 2015-10-24 ENCOUNTER — Ambulatory Visit (INDEPENDENT_AMBULATORY_CARE_PROVIDER_SITE_OTHER): Payer: Medicare Other | Admitting: Urology

## 2015-10-24 ENCOUNTER — Encounter: Payer: Self-pay | Admitting: Urology

## 2015-10-24 VITALS — BP 123/85 | HR 97 | Ht 69.0 in | Wt 296.1 lb

## 2015-10-24 DIAGNOSIS — N2889 Other specified disorders of kidney and ureter: Secondary | ICD-10-CM

## 2015-10-24 NOTE — Progress Notes (Signed)
10/24/2015 12:06 PM   [name] 12/31/1950 [id number]  Referring provider: Tamsen Roersennis E Chrismon, PA [address] [place], [place] [id number]  Chief Complaint  Patient presents with  . New Patient (Initial Visit)    complex cyst    HPI: Mr Christopher BishopLawrence is a 65yo with a hx of COPD on home O2 who was found to have a left renal cyst on Chest CT, noncontrast. There was a concern that the cyst was complex. No previous abdominal imaging.  He has a hx of open cholecystectomy and appendectomy 44 years ago.   He denies any LUTS. No hematuria   PMH: No past medical history on file.  Surgical History: Past Surgical History  Procedure Laterality Date  . Vasectomy  1979  . Appendectomy  1974  . Cholecystectomy  1974  . Tonsillectomy and adenoidectomy  1970  . Nasal sinus surgery  [phone],1988,1998    Home Medications:    Medication List       This list is accurate as of: 10/24/15 12:06 PM.  Always use your most recent med list.               albuterol 108 (90 Base) MCG/ACT inhaler  Commonly known as:  PROVENTIL HFA;VENTOLIN HFA  Inhale 2 puffs into the lungs 2 (two) times daily.     aspirin 81 MG tablet  Take 1 tablet by mouth daily.     BIOFLAVONOID PRODUCTS PO  Take 1 tablet by mouth daily.     COMBIVENT RESPIMAT 20-100 MCG/ACT Aers respimat  Generic drug:  Ipratropium-Albuterol  USE ONE PUFF FOUR TIMES DAILY     diltiazem 240 MG 24 hr capsule  Commonly known as:  TIAZAC  Take 1 capsule by mouth daily. Reported on 10/24/2015     diltiazem 240 MG 24 hr capsule  Commonly known as:  CARDIZEM CD  Take by mouth.     Fluticasone-Salmeterol 500-50 MCG/DOSE Aepb  Commonly known as:  ADVAIR DISKUS  INHALE 1 PUFF TWICE DAILY.  RINSE MOUTH AFTER EACH USE     furosemide 40 MG tablet  Commonly known as:  LASIX  TAKE ONE TABLET TWICE DAILY AS NEEDED     ibuprofen 200 MG tablet  Commonly known as:  ADVIL,MOTRIN  Take 1 tablet by mouth as needed.     ibuprofen 400  MG tablet  Commonly known as:  ADVIL,MOTRIN  Take 1 tablet by mouth 3 (three) times daily. Reported on 10/24/2015     metoprolol 50 MG tablet  Commonly known as:  LOPRESSOR     montelukast 10 MG tablet  Commonly known as:  SINGULAIR  TAKE 1 TABLET BY MOUTH AT BEDTIME     morphine 20 MG/5ML solution  Take 20 mg by mouth every 4 (four) hours as needed.     MULTIPLE VITAMIN PO  Take 1 tablet by mouth daily.     naproxen sodium 220 MG tablet  Commonly known as:  ANAPROX  Take 1 tablet by mouth as needed. Reported on 10/24/2015     potassium chloride 10 MEQ CR capsule  Commonly known as:  MICRO-K  Take 1 capsule by mouth as needed. Reported on 08/29/2015     potassium chloride SA 20 MEQ tablet  Commonly known as:  K-DUR,KLOR-CON  Take 1 tablet by mouth daily. Reported on 10/24/2015     predniSONE 10 MG tablet  Commonly known as:  DELTASONE  TAKE ONE (1) TABLET EACH DAY     ranitidine 150 MG  tablet  Commonly known as:  ZANTAC  TAKE ONE TABLET EVERY 12 HOURS     TUDORZA PRESSAIR 400 MCG/ACT Aepb  Generic drug:  Aclidinium Bromide        Allergies:  Allergies  Allergen Reactions  . Aspirin     Other reaction(s): Asthma flare up  . Sulfa Antibiotics     Family History: Family History  Problem Relation Age of Onset  . Heart disease Mother   . Congestive Heart Failure Father     Social History:  reports that he has quit smoking. He does not have any smokeless tobacco history on file. He reports that he does not drink alcohol or use illicit drugs.  ROS: UROLOGY Frequent Urination?: No Hard to postpone urination?: No Burning/pain with urination?: No Get up at night to urinate?: No Leakage of urine?: No Urine stream starts and stops?: No Trouble starting stream?: No Do you have to strain to urinate?: No Blood in urine?: No Urinary tract infection?: No Sexually transmitted disease?: No Injury to kidneys or bladder?: No Painful intercourse?: No Weak stream?:  Yes Erection problems?: No Penile pain?: No  Gastrointestinal Nausea?: No Vomiting?: No Indigestion/heartburn?: Yes Diarrhea?: No Constipation?: No  Constitutional Fever: No Night sweats?: No Weight loss?: No Fatigue?: Yes  Skin Skin rash/lesions?: No Itching?: No  Eyes Blurred vision?: No Double vision?: No  Ears/Nose/Throat Sore throat?: No Sinus problems?: No  Hematologic/Lymphatic Swollen glands?: No Easy bruising?: Yes  Cardiovascular Leg swelling?: Yes Chest pain?: No  Respiratory Cough?: Yes Shortness of breath?: Yes  Endocrine Excessive thirst?: No  Musculoskeletal Back pain?: Yes Joint pain?: Yes  Neurological Headaches?: No Dizziness?: No  Psychologic Depression?: No Anxiety?: No  Physical Exam: BP 123/85 mmHg  Pulse 97  Ht 5\' 9"  (1.753 m)  Wt 134.31 kg (296 lb 1.6 oz)  BMI 43.71 kg/m2  Constitutional:  Alert and oriented, No acute distress. HEENT: Roy AT, moist mucus membranes.  Trachea midline, no masses. Cardiovascular: No clubbing, cyanosis, or edema. Respiratory: Normal respiratory effort, no increased work of breathing. GI: Abdomen is soft, nontender, nondistended, no abdominal masses GU: No CVA tenderness.  Skin: No rashes, bruises or suspicious lesions. Lymph: No cervical or inguinal adenopathy. Neurologic: Grossly intact, no focal deficits, moving all 4 extremities. Psychiatric: Normal mood and affect.  Laboratory Data: Lab Results  Component Value Date   WBC 16.2* 09/18/2015   HGB 16.1 09/04/2014   HCT 47.0 09/18/2015   MCV 100* 09/18/2015   PLT 238 09/02/2015    Lab Results  Component Value Date   CREATININE 0.70* 09/02/2015    Lab Results  Component Value Date   PSA 1.4 08/14/2014    No results found for: TESTOSTERONE  Lab Results  Component Value Date   HGBA1C 6.0 06/11/2013    Urinalysis    Component Value Date/Time   COLORURINE Yellow 05/04/2013 0411   APPEARANCEUR Clear 05/04/2013 0411    LABSPEC 1.014 05/04/2013 0411   PHURINE 5.0 05/04/2013 0411   GLUCOSEU Negative 05/04/2013 0411   HGBUR Negative 05/04/2013 0411   BILIRUBINUR Negative 05/04/2013 0411   KETONESUR Negative 05/04/2013 0411   PROTEINUR 100 mg/dL 16/10/960401/06/2013 [id number]   NITRITE Negative 05/04/2013 0411   LEUKOCYTESUR Negative 05/04/2013 0411    Pertinent Imaging: CT chest  Assessment & Plan:   1. Left renal mass -Ct abd/pelvis w/wo contrast  There are no diagnoses linked to this encounter.  No Follow-up on file.  Wilkie AyePatrick Natalea Sutliff, MD  [facility] [address],  [place], [place] [phone]

## 2015-10-24 NOTE — Addendum Note (Signed)
Addended by: Lonna CobbUSSELL, KELITA L on: 10/24/2015 12:24 PM   Modules accepted: Orders

## 2015-10-25 LAB — BASIC METABOLIC PANEL
BUN / CREAT RATIO: 19 (ref 10–24)
BUN: 17 mg/dL (ref 8–27)
CO2: 26 mmol/L (ref 18–29)
CREATININE: 0.89 mg/dL (ref 0.76–1.27)
Calcium: 9.4 mg/dL (ref 8.6–10.2)
Chloride: 96 mmol/L (ref 96–106)
GFR, EST AFRICAN AMERICAN: 104 mL/min/{1.73_m2} (ref >59)
GFR, EST NON AFRICAN AMERICAN: 90 mL/min/{1.73_m2} (ref >59)
GLUCOSE: 125 mg/dL — AB (ref 65–99)
Potassium: 4.8 mmol/L (ref 3.5–5.2)
SODIUM: 141 mmol/L (ref 134–144)

## 2015-10-28 ENCOUNTER — Other Ambulatory Visit: Payer: Self-pay | Admitting: Family Medicine

## 2015-10-30 DIAGNOSIS — I4891 Unspecified atrial fibrillation: Secondary | ICD-10-CM | POA: Diagnosis not present

## 2015-10-30 DIAGNOSIS — Z6841 Body Mass Index (BMI) 40.0 and over, adult: Secondary | ICD-10-CM | POA: Diagnosis not present

## 2015-11-05 DIAGNOSIS — I270 Primary pulmonary hypertension: Secondary | ICD-10-CM | POA: Diagnosis not present

## 2015-11-07 ENCOUNTER — Ambulatory Visit
Admission: RE | Admit: 2015-11-07 | Discharge: 2015-11-07 | Disposition: A | Discharge status: Home / Self Care | Payer: Medicare Other | Source: Ambulatory Visit | Attending: Urology | Admitting: Urology

## 2015-11-07 DIAGNOSIS — N281 Cyst of kidney, acquired: Secondary | ICD-10-CM | POA: Diagnosis not present

## 2015-11-07 DIAGNOSIS — K760 Fatty (change of) liver, not elsewhere classified: Secondary | ICD-10-CM | POA: Insufficient documentation

## 2015-11-07 DIAGNOSIS — N2889 Other specified disorders of kidney and ureter: Secondary | ICD-10-CM | POA: Insufficient documentation

## 2015-11-07 DIAGNOSIS — N200 Calculus of kidney: Secondary | ICD-10-CM | POA: Diagnosis not present

## 2015-11-07 HISTORY — DX: Unspecified asthma, uncomplicated: J45.909

## 2015-11-07 HISTORY — DX: Heart failure, unspecified: I50.9

## 2015-11-07 MED ORDER — IOPAMIDOL (ISOVUE-370) INJECTION 76%
100.0000 mL | Freq: Once | INTRAVENOUS | Status: AC | PRN
  Administered 2015-11-07: 100 mL via INTRAVENOUS

## 2015-11-11 ENCOUNTER — Ambulatory Visit: Payer: Medicare Other

## 2015-11-18 ENCOUNTER — Other Ambulatory Visit: Payer: Self-pay | Admitting: Family Medicine

## 2015-11-18 ENCOUNTER — Ambulatory Visit (INDEPENDENT_AMBULATORY_CARE_PROVIDER_SITE_OTHER): Payer: Medicare Other | Admitting: Urology

## 2015-11-18 ENCOUNTER — Encounter: Payer: Self-pay | Admitting: Urology

## 2015-11-18 VITALS — BP 125/85 | HR 121 | Ht 69.0 in | Wt 301.0 lb

## 2015-11-18 DIAGNOSIS — N281 Cyst of kidney, acquired: Secondary | ICD-10-CM

## 2015-11-18 DIAGNOSIS — R609 Edema, unspecified: Secondary | ICD-10-CM

## 2015-11-18 DIAGNOSIS — Q6100 Congenital renal cyst, unspecified: Secondary | ICD-10-CM

## 2015-11-18 NOTE — Progress Notes (Signed)
65 year old very unhealthy man with medical comorbidities of end-stage COPD and CHF with associated morbid obesity presents today for follow-up CT scan. This was done for complex cyst seen on a noncontrast CT scan several weeks prior. Since the patient was last seen he is not had any flank pain, hematuria, or progressive voiding symptoms. Past Medical History  Diagnosis Date  . Asthma   . CHF (congestive heart failure) (HCC)    Current Outpatient Prescriptions on File Prior to Visit  Medication Sig Dispense Refill  . albuterol (PROVENTIL HFA;VENTOLIN HFA) 108 (90 BASE) MCG/ACT inhaler Inhale 2 puffs into the lungs 2 (two) times daily.    Marland Kitchen. aspirin 81 MG tablet Take 1 tablet by mouth daily.    Marland Kitchen. BIOFLAVONOID PRODUCTS PO Take 1 tablet by mouth daily.    . COMBIVENT RESPIMAT 20-100 MCG/ACT AERS respimat USE ONE PUFF FOUR TIMES DAILY 4 g 12  . diltiazem (CARDIZEM CD) 240 MG 24 hr capsule Take by mouth.    . diltiazem (TIAZAC) 240 MG 24 hr capsule Take 1 capsule by mouth daily. Reported on 10/24/2015    . Fluticasone-Salmeterol (ADVAIR DISKUS) 500-50 MCG/DOSE AEPB INHALE 1 PUFF TWICE DAILY.  RINSE MOUTH AFTER EACH USE 60 each 3  . furosemide (LASIX) 40 MG tablet TAKE ONE TABLET TWICE DAILY AS NEEDED 60 tablet 1  . ibuprofen (ADVIL,MOTRIN) 200 MG tablet Take 1 tablet by mouth as needed.    Marland Kitchen. ibuprofen (ADVIL,MOTRIN) 400 MG tablet Take 1 tablet by mouth 3 (three) times daily. Reported on 10/24/2015    . metoprolol (LOPRESSOR) 50 MG tablet     . montelukast (SINGULAIR) 10 MG tablet TAKE 1 TABLET BY MOUTH AT BEDTIME 30 tablet 6  . morphine 20 MG/5ML solution Take 20 mg by mouth every 4 (four) hours as needed.    . MULTIPLE VITAMIN PO Take 1 tablet by mouth daily.    . naproxen sodium (ANAPROX) 220 MG tablet Take 1 tablet by mouth as needed. Reported on 10/24/2015    . potassium chloride (MICRO-K) 10 MEQ CR capsule Take 1 capsule by mouth as needed. Reported on 08/29/2015    . potassium chloride SA  (K-DUR,KLOR-CON) 20 MEQ tablet Take 1 tablet by mouth daily. Reported on 10/24/2015    . predniSONE (DELTASONE) 10 MG tablet TAKE ONE (1) TABLET EACH DAY 30 tablet 3  . ranitidine (ZANTAC) 150 MG tablet TAKE ONE TABLET EVERY 12 HOURS 60 tablet 6  . TUDORZA PRESSAIR 400 MCG/ACT AEPB      No current facility-administered medications on file prior to visit.   Filed Vitals:   11/18/15 0931  BP: 125/85  Pulse: 121   NAD On O2, labored breathing Morbidly obese  Images: I have independently reviewed the patient's CT scan as well as gone over the findings with the patient.  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 These are small with very low risk of malignant potential. Recommendation: We discussed the CT scan findings and the cystic nature of these lesions. I reassured the patient that there was very low risk of malignant potential and as such these did not require any additional follow-up. The patient follow-up with us on an as-needed basis.

## 2015-12-15 DIAGNOSIS — I5042 Chronic combined systolic (congestive) and diastolic (congestive) heart failure: Secondary | ICD-10-CM | POA: Diagnosis not present

## 2015-12-15 DIAGNOSIS — I270 Primary pulmonary hypertension: Secondary | ICD-10-CM | POA: Diagnosis not present

## 2015-12-15 DIAGNOSIS — I482 Chronic atrial fibrillation: Secondary | ICD-10-CM | POA: Diagnosis not present

## 2015-12-15 DIAGNOSIS — J432 Centrilobular emphysema: Secondary | ICD-10-CM | POA: Diagnosis not present

## 2015-12-17 DIAGNOSIS — I481 Persistent atrial fibrillation: Secondary | ICD-10-CM | POA: Diagnosis not present

## 2015-12-17 DIAGNOSIS — I5032 Chronic diastolic (congestive) heart failure: Secondary | ICD-10-CM | POA: Diagnosis not present

## 2015-12-17 DIAGNOSIS — J42 Unspecified chronic bronchitis: Secondary | ICD-10-CM | POA: Diagnosis not present

## 2015-12-22 ENCOUNTER — Other Ambulatory Visit: Payer: Self-pay | Admitting: Family Medicine

## 2015-12-22 DIAGNOSIS — J9611 Chronic respiratory failure with hypoxia: Secondary | ICD-10-CM

## 2016-01-01 ENCOUNTER — Encounter: Payer: Self-pay | Admitting: Family Medicine

## 2016-01-01 ENCOUNTER — Ambulatory Visit (INDEPENDENT_AMBULATORY_CARE_PROVIDER_SITE_OTHER): Payer: Medicare Other | Admitting: Family Medicine

## 2016-01-01 VITALS — BP 116/74 | HR 64 | Temp 98.5°F | Wt 298.2 lb

## 2016-01-01 DIAGNOSIS — J9611 Chronic respiratory failure with hypoxia: Secondary | ICD-10-CM

## 2016-01-01 DIAGNOSIS — J432 Centrilobular emphysema: Secondary | ICD-10-CM | POA: Diagnosis not present

## 2016-01-01 DIAGNOSIS — M170 Bilateral primary osteoarthritis of knee: Secondary | ICD-10-CM

## 2016-01-01 DIAGNOSIS — I482 Chronic atrial fibrillation, unspecified: Secondary | ICD-10-CM

## 2016-01-01 DIAGNOSIS — M129 Arthropathy, unspecified: Secondary | ICD-10-CM

## 2016-01-01 DIAGNOSIS — Z8639 Personal history of other endocrine, nutritional and metabolic disease: Secondary | ICD-10-CM

## 2016-01-01 DIAGNOSIS — G4733 Obstructive sleep apnea (adult) (pediatric): Secondary | ICD-10-CM

## 2016-01-01 DIAGNOSIS — I509 Heart failure, unspecified: Secondary | ICD-10-CM | POA: Diagnosis not present

## 2016-01-01 NOTE — Progress Notes (Signed)
Patient: [name] Male    DOB: August 27, 1950   65 y.o.   MRN: [id number] Visit Date: 01/01/2016  Today's Provider: Dortha Kern, PA   Chief Complaint  Patient presents with  . Follow-up   Subjective:    HPI Patient is requesting long term disability status report be completed.   Past Medical History:  Diagnosis Date  . Asthma   . CHF (congestive heart failure) [facility])    Patient Active Problem List   Diagnosis Date Noted  . A-fib (HCC) 10/03/2014  . Chronic asthmatic bronchitis with acute exacerbation (HCC) 10/03/2014  . Chronic respiratory failure (HCC) 10/03/2014  . CAFL (chronic airflow limitation) (HCC) 10/03/2014  . Cervical osteoarthritis 10/03/2014  . Chronic obstructive pulmonary emphysema (HCC) 10/03/2014  . Fatigue 10/03/2014  . History of atrial fibrillation 10/03/2014  . Obstructive apnea 10/03/2014  . Disturbance of skin sensation 10/03/2014  . Edema, peripheral 10/03/2014  . CCF (congestive cardiac failure) (HCC) 03/26/2014  . Chronic obstructive pulmonary disease (HCC) 09/21/2013  . Carbuncle and furuncle of buttock 12/04/2009  . Elevated WBC count 01/09/2009  . Hypoxemia 12/10/2008  . 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 Accumulation of fluid in tissues 03/31/2006  . Esophagitis, reflux 03/31/2006  . Decreased potassium in the blood 03/31/2006  . Adiposity 03/31/2006  . Primary localized osteoarthrosis, lower leg 03/31/2006  . Apnea, sleep 03/31/2006   Past Surgical History:  Procedure Laterality Date  . APPENDECTOMY  1974  . CHOLECYSTECTOMY  1974  . NASAL SINUS SURGERY  [phone]  . TONSILLECTOMY AND ADENOIDECTOMY  1970  . VASECTOMY  1979   Family History  Problem Relation Age of Onset  . Heart disease Mother   . Congestive Heart Failure Father    Allergies  Allergen Reactions  . Aspirin     Other reaction(s): Asthma flare up  . Sulfa Antibiotics    Previous Medications   ALBUTEROL  (PROVENTIL HFA;VENTOLIN HFA) 108 (90 BASE) MCG/ACT INHALER    Inhale 2 puffs into the lungs 2 (two) times daily.   ASPIRIN 81 MG TABLET    Take 1 tablet by mouth daily.   BIOFLAVONOID PRODUCTS PO    Take 1 tablet by mouth daily.   COMBIVENT RESPIMAT 20-100 MCG/ACT AERS RESPIMAT    USE ONE PUFF FOUR TIMES DAILY   DILTIAZEM (CARDIZEM CD) 240 MG 24 HR CAPSULE    Take by mouth.   DILTIAZEM (TIAZAC) 240 MG 24 HR CAPSULE    Take 1 capsule by mouth daily. Reported on 10/24/2015   FLUTICASONE-SALMETEROL (ADVAIR DISKUS) 500-50 MCG/DOSE AEPB    INHALE 1 PUFF TWICE DAILY.  RINSE MOUTH AFTER EACH USE   FUROSEMIDE (LASIX) 40 MG TABLET    TAKE ONE TABLET TWICE DAILY AS NEEDED   IBUPROFEN (ADVIL,MOTRIN) 200 MG TABLET    Take 1 tablet by mouth as needed.   IBUPROFEN (ADVIL,MOTRIN) 400 MG TABLET    Take 1 tablet by mouth 3 (three) times daily. Reported on 10/24/2015   METOPROLOL (LOPRESSOR) 50 MG TABLET       MONTELUKAST (SINGULAIR) 10 MG TABLET    TAKE 1 TABLET BY MOUTH AT BEDTIME   MORPHINE 20 MG/5ML SOLUTION    Take 20 mg by mouth every 4 (four) hours as needed.   MULTIPLE VITAMIN PO    Take 1 tablet by mouth daily.   NAPROXEN SODIUM (ANAPROX) 220 MG TABLET    Take 1 tablet by mouth as needed. Reported on 10/24/2015   POTASSIUM  CHLORIDE (MICRO-K) 10 MEQ CR CAPSULE    Take 1 capsule by mouth as needed. Reported on 08/29/2015   POTASSIUM CHLORIDE SA (K-DUR,KLOR-CON) 20 MEQ TABLET    Take 1 tablet by mouth daily. Reported on 10/24/2015   PREDNISONE (DELTASONE) 10 MG TABLET    TAKE ONE (1) TABLET EACH DAY   RANITIDINE (ZANTAC) 150 MG TABLET    TAKE ONE TABLET EVERY 12 HOURS   TUDORZA PRESSAIR 400 MCG/ACT AEPB        Review of Systems  Constitutional: Negative.   Respiratory: Positive for shortness of breath.   Cardiovascular: Negative.   Genitourinary: Negative.     Social History  Substance Use Topics  . Smoking status: Former Games developermoker  . Smokeless tobacco: Not on file  . Alcohol use No   Objective:   BP  116/74 (BP Location: Left Arm, Patient Position: Sitting, Cuff Size: Large)   Pulse 64   Temp 98.5 F (36.9 C) (Oral)   Wt 298 lb 3.2 oz (135.3 kg)   BMI 44.04 kg/m   Physical Exam  Constitutional: He is oriented to person, place, and time. He appears well-developed and well-nourished. No distress.  Morbid obesity.  HENT:  Head: Normocephalic and atraumatic.  Right Ear: Hearing normal.  Left Ear: Hearing normal.  Nose: Nose normal.  Eyes: Conjunctivae and lids are normal. Right eye exhibits no discharge. Left eye exhibits no discharge. No scleral icterus.  Neck: Neck supple.  Cardiovascular: Normal rate.   Slight irregular rate and rhythm with history of atrial fibrillation in good control.  Pulmonary/Chest: Breath sounds normal. He is in respiratory distress.  Labored breathing with walking across the room. Controlled with continuous oxygen at 2 LPM by nasal cannula.  Abdominal: Soft. Bowel sounds are normal.  Musculoskeletal:  Obese legs with crepitus both knees sore to palpate.  Neurological: He is alert and oriented to person, place, and time.  Skin: Skin is intact. No lesion and no rash noted.  Psychiatric: He has a normal mood and affect. His speech is normal and behavior is normal. Thought content normal.      Assessment & Plan:     1. Chronic respiratory failure with hypoxia (HCC) Persistent dyspnea and OSA at night (on BiPAP) with oxygen 2 LPM 24 hours a day. Has had hospitalizations for acute on chronic respiratory failure requiring intubation in 2003 and 2015. Pulmonologist (Dr. Welton FlakesKhan) continues Advair, daily prednisone,Tudorza, Singulair, Combivent Respimat and Albuterol prn. Had a history of smoking 1 ppd for 45 years before he quit in 2003. On 08-29-15, pulse oximetry 91% on 2LPM sitting still. Drops to 85% on room air (without oxygen) and sitting still. Walking very short distance (20-30 ft) without oxygen, pulse oximetry drops to 80%. Walking with oxygen at 2 LPM pulse  oximetry drops to 85% with pulse up to 120 BPM. Must continue oxygen and BiPAP at night. Will recheck routine labs and encouraged to continue follow up with pulmonologist. Completed form regarding long term disability. - CBC with Differential/Platelet - Comprehensive metabolic panel  2. Centrilobular emphysema (HCC) Documented on CT scan of chest on 09-30-15. Followed by pulmonologist. - CBC with Differential/Platelet  3. Chronic congestive heart failure, unspecified congestive heart failure type (HCC) Stable with intermittent edema of lower extremities. Difficult to ascertain due to morbid obesity. Followed by Dr. Lady GaryFath and treated with Lasix 40 mg BID prn edema and KCL 20 meq BID supplement for occasional hypokalemia. - Lipid panel  4. Chronic atrial fibrillation (HCC) Well controlled. Followed  by Dr. Lady Gary (cardiologist). Continues Metoprolol 50 mg BID and Diltiazem-ER 240 mg qd for control of heart rhythm. - CBC with Differential/Platelet - Lipid panel  5. Obstructive apnea Stable on BiPAP each night. Followed by Dr. Welton Flakes (pulmonologist).  6. Obesity, Class III, BMI 40-49.9 (morbid obesity) (HCC) Very inactive due to hypoxia, dyspnea, CHF and arthritis of both knees. Continues efforts to control diet but unable to exercise. Will recheck labs for additional metabolic disorders. - Comprehensive metabolic panel - Hemoglobin A1c - TSH - Lipid panel  7. Arthritis of both knees Crepitus of both knees. Flares occasionally. - CBC with Differential/Platelet  8. History of elevated glucose With history of elevated glucose in the past and morbid obesity, will get follow up labs. - Comprehensive metabolic panel - Hemoglobin A1c - TSH

## 2016-01-07 DIAGNOSIS — I482 Chronic atrial fibrillation: Secondary | ICD-10-CM | POA: Diagnosis not present

## 2016-01-07 DIAGNOSIS — M129 Arthropathy, unspecified: Secondary | ICD-10-CM | POA: Diagnosis not present

## 2016-01-07 DIAGNOSIS — J9611 Chronic respiratory failure with hypoxia: Secondary | ICD-10-CM | POA: Diagnosis not present

## 2016-01-07 DIAGNOSIS — J432 Centrilobular emphysema: Secondary | ICD-10-CM | POA: Diagnosis not present

## 2016-01-07 DIAGNOSIS — I509 Heart failure, unspecified: Secondary | ICD-10-CM | POA: Diagnosis not present

## 2016-01-07 DIAGNOSIS — Z8639 Personal history of other endocrine, nutritional and metabolic disease: Secondary | ICD-10-CM | POA: Diagnosis not present

## 2016-01-08 ENCOUNTER — Telehealth: Payer: Self-pay

## 2016-01-08 LAB — COMPREHENSIVE METABOLIC PANEL
A/G RATIO: 1.5 (ref 1.2–2.2)
ALT: 21 IU/L (ref 0–44)
AST: 25 IU/L (ref 0–40)
Albumin: 4.1 g/dL (ref 3.6–4.8)
Alkaline Phosphatase: 74 IU/L (ref 39–117)
BUN/Creatinine Ratio: 13 (ref 10–24)
BUN: 12 mg/dL (ref 8–27)
Bilirubin Total: 0.4 mg/dL (ref 0.0–1.2)
CALCIUM: 9.1 mg/dL (ref 8.6–10.2)
CO2: 28 mmol/L (ref 18–29)
Chloride: 97 mmol/L (ref 96–106)
Creatinine, Ser: 0.96 mg/dL (ref 0.76–1.27)
GFR, EST AFRICAN AMERICAN: 96 mL/min/{1.73_m2} (ref >59)
GFR, EST NON AFRICAN AMERICAN: 83 mL/min/{1.73_m2} (ref >59)
GLOBULIN, TOTAL: 2.7 g/dL (ref 1.5–4.5)
Glucose: 99 mg/dL (ref 65–99)
POTASSIUM: 4.7 mmol/L (ref 3.5–5.2)
SODIUM: 140 mmol/L (ref 134–144)
TOTAL PROTEIN: 6.8 g/dL (ref 6.0–8.5)

## 2016-01-08 LAB — CBC WITH DIFFERENTIAL/PLATELET
BASOS: 0 %
Basophils Absolute: 0.1 10*3/uL (ref 0.0–0.2)
EOS (ABSOLUTE): 0.3 10*3/uL (ref 0.0–0.4)
EOS: 2 %
HEMATOCRIT: 46.7 % (ref 37.5–51.0)
Hemoglobin: 15.4 g/dL (ref 12.6–17.7)
IMMATURE GRANS (ABS): 0 10*3/uL (ref 0.0–0.1)
IMMATURE GRANULOCYTES: 0 %
Lymphocytes Absolute: 1.9 10*3/uL (ref 0.7–3.1)
Lymphs: 11 %
MCH: 32.4 pg (ref 26.6–33.0)
MCHC: 33 g/dL (ref 31.5–35.7)
MCV: 98 fL — AB (ref 79–97)
MONOS ABS: 1 10*3/uL — AB (ref 0.1–0.9)
Monocytes: 5 %
NEUTROS PCT: 82 %
Neutrophils Absolute: 14.7 10*3/uL — ABNORMAL HIGH (ref 1.4–7.0)
Platelets: 234 10*3/uL (ref 150–379)
RBC: 4.75 x10E6/uL (ref 4.14–5.80)
RDW: 14.3 % (ref 12.3–15.4)
WBC: 17.9 10*3/uL — ABNORMAL HIGH (ref 3.4–10.8)

## 2016-01-08 LAB — LIPID PANEL
CHOL/HDL RATIO: 3.8 ratio (ref 0.0–5.0)
Cholesterol, Total: 169 mg/dL (ref 100–199)
HDL: 45 mg/dL (ref >39)
LDL Calculated: 83 mg/dL (ref 0–99)
Triglycerides: 205 mg/dL — ABNORMAL HIGH (ref 0–149)
VLDL CHOLESTEROL CAL: 41 mg/dL — AB (ref 5–40)

## 2016-01-08 LAB — HEMOGLOBIN A1C
Est. average glucose Bld gHb Est-mCnc: 117 mg/dL
Hgb A1c MFr Bld: 5.7 % — ABNORMAL HIGH (ref 4.8–5.6)

## 2016-01-08 LAB — TSH: TSH: 1.44 u[IU]/mL (ref 0.450–4.500)

## 2016-01-08 NOTE — Telephone Encounter (Signed)
-----   Message from Tamsen Roersennis E Chrismon, [place] sent at 01/08/2016  4:03 PM EDT ----- All blood tests normal except WBC count elevated. Probably due to daily use of prednisone. Recheck in 3 months.

## 2016-01-08 NOTE — Telephone Encounter (Signed)
Patient advised.

## 2016-01-14 DIAGNOSIS — J42 Unspecified chronic bronchitis: Secondary | ICD-10-CM | POA: Diagnosis not present

## 2016-01-14 DIAGNOSIS — I4891 Unspecified atrial fibrillation: Secondary | ICD-10-CM | POA: Diagnosis not present

## 2016-01-14 DIAGNOSIS — I5032 Chronic diastolic (congestive) heart failure: Secondary | ICD-10-CM | POA: Diagnosis not present

## 2016-01-16 ENCOUNTER — Other Ambulatory Visit: Payer: Self-pay | Admitting: Family Medicine

## 2016-01-28 DIAGNOSIS — R0602 Shortness of breath: Secondary | ICD-10-CM | POA: Diagnosis not present

## 2016-02-12 DIAGNOSIS — J42 Unspecified chronic bronchitis: Secondary | ICD-10-CM | POA: Diagnosis not present

## 2016-02-12 DIAGNOSIS — I481 Persistent atrial fibrillation: Secondary | ICD-10-CM | POA: Diagnosis not present

## 2016-02-12 DIAGNOSIS — I5032 Chronic diastolic (congestive) heart failure: Secondary | ICD-10-CM | POA: Diagnosis not present

## 2016-02-13 ENCOUNTER — Other Ambulatory Visit: Payer: Self-pay | Admitting: Family Medicine

## 2016-02-13 DIAGNOSIS — J9611 Chronic respiratory failure with hypoxia: Secondary | ICD-10-CM

## 2016-02-25 ENCOUNTER — Other Ambulatory Visit: Payer: Self-pay | Admitting: Family Medicine

## 2016-03-03 ENCOUNTER — Other Ambulatory Visit: Payer: Self-pay | Admitting: Family Medicine

## 2016-03-03 DIAGNOSIS — J9611 Chronic respiratory failure with hypoxia: Secondary | ICD-10-CM

## 2016-03-16 DIAGNOSIS — J9612 Chronic respiratory failure with hypercapnia: Secondary | ICD-10-CM | POA: Diagnosis not present

## 2016-03-16 DIAGNOSIS — J449 Chronic obstructive pulmonary disease, unspecified: Secondary | ICD-10-CM | POA: Diagnosis not present

## 2016-03-16 DIAGNOSIS — I482 Chronic atrial fibrillation: Secondary | ICD-10-CM | POA: Diagnosis not present

## 2016-05-17 ENCOUNTER — Emergency Department: Payer: Medicare Other

## 2016-05-17 ENCOUNTER — Inpatient Hospital Stay
Admission: EM | Admit: 2016-05-17 | Discharge: 2016-05-24 | DRG: 871 | Disposition: A | Discharge status: Skilled Nursing Facility | Payer: Medicare Other | Attending: Internal Medicine | Admitting: Internal Medicine

## 2016-05-17 DIAGNOSIS — R6521 Severe sepsis with septic shock: Secondary | ICD-10-CM | POA: Diagnosis not present

## 2016-05-17 DIAGNOSIS — R001 Bradycardia, unspecified: Secondary | ICD-10-CM | POA: Diagnosis not present

## 2016-05-17 DIAGNOSIS — Z6841 Body Mass Index (BMI) 40.0 and over, adult: Secondary | ICD-10-CM | POA: Diagnosis not present

## 2016-05-17 DIAGNOSIS — J9602 Acute respiratory failure with hypercapnia: Secondary | ICD-10-CM | POA: Diagnosis present

## 2016-05-17 DIAGNOSIS — M6281 Muscle weakness (generalized): Secondary | ICD-10-CM

## 2016-05-17 DIAGNOSIS — T447X5A Adverse effect of beta-adrenoreceptor antagonists, initial encounter: Secondary | ICD-10-CM | POA: Diagnosis not present

## 2016-05-17 DIAGNOSIS — J9601 Acute respiratory failure with hypoxia: Secondary | ICD-10-CM | POA: Diagnosis not present

## 2016-05-17 DIAGNOSIS — E871 Hypo-osmolality and hyponatremia: Secondary | ICD-10-CM | POA: Diagnosis present

## 2016-05-17 DIAGNOSIS — F419 Anxiety disorder, unspecified: Secondary | ICD-10-CM | POA: Diagnosis not present

## 2016-05-17 DIAGNOSIS — R0600 Dyspnea, unspecified: Secondary | ICD-10-CM

## 2016-05-17 DIAGNOSIS — J1000 Influenza due to other identified influenza virus with unspecified type of pneumonia: Secondary | ICD-10-CM | POA: Diagnosis not present

## 2016-05-17 DIAGNOSIS — N179 Acute kidney failure, unspecified: Secondary | ICD-10-CM | POA: Diagnosis present

## 2016-05-17 DIAGNOSIS — J9622 Acute and chronic respiratory failure with hypercapnia: Secondary | ICD-10-CM | POA: Diagnosis not present

## 2016-05-17 DIAGNOSIS — Z79899 Other long term (current) drug therapy: Secondary | ICD-10-CM | POA: Diagnosis not present

## 2016-05-17 DIAGNOSIS — Z7401 Bed confinement status: Secondary | ICD-10-CM | POA: Diagnosis not present

## 2016-05-17 DIAGNOSIS — Z9981 Dependence on supplemental oxygen: Secondary | ICD-10-CM | POA: Diagnosis not present

## 2016-05-17 DIAGNOSIS — R0603 Acute respiratory distress: Secondary | ICD-10-CM | POA: Diagnosis not present

## 2016-05-17 DIAGNOSIS — J111 Influenza due to unidentified influenza virus with other respiratory manifestations: Secondary | ICD-10-CM | POA: Diagnosis not present

## 2016-05-17 DIAGNOSIS — Z87891 Personal history of nicotine dependence: Secondary | ICD-10-CM

## 2016-05-17 DIAGNOSIS — I110 Hypertensive heart disease with heart failure: Secondary | ICD-10-CM | POA: Diagnosis present

## 2016-05-17 DIAGNOSIS — Z7982 Long term (current) use of aspirin: Secondary | ICD-10-CM

## 2016-05-17 DIAGNOSIS — Z7952 Long term (current) use of systemic steroids: Secondary | ICD-10-CM

## 2016-05-17 DIAGNOSIS — J449 Chronic obstructive pulmonary disease, unspecified: Secondary | ICD-10-CM

## 2016-05-17 DIAGNOSIS — Z7951 Long term (current) use of inhaled steroids: Secondary | ICD-10-CM

## 2016-05-17 DIAGNOSIS — J440 Chronic obstructive pulmonary disease with acute lower respiratory infection: Secondary | ICD-10-CM | POA: Diagnosis not present

## 2016-05-17 DIAGNOSIS — J101 Influenza due to other identified influenza virus with other respiratory manifestations: Secondary | ICD-10-CM | POA: Diagnosis not present

## 2016-05-17 DIAGNOSIS — R55 Syncope and collapse: Secondary | ICD-10-CM | POA: Diagnosis not present

## 2016-05-17 DIAGNOSIS — R0902 Hypoxemia: Secondary | ICD-10-CM | POA: Diagnosis not present

## 2016-05-17 DIAGNOSIS — I5032 Chronic diastolic (congestive) heart failure: Secondary | ICD-10-CM | POA: Diagnosis present

## 2016-05-17 DIAGNOSIS — J1100 Influenza due to unidentified influenza virus with unspecified type of pneumonia: Secondary | ICD-10-CM | POA: Diagnosis present

## 2016-05-17 DIAGNOSIS — Z4682 Encounter for fitting and adjustment of non-vascular catheter: Secondary | ICD-10-CM | POA: Diagnosis not present

## 2016-05-17 DIAGNOSIS — E872 Acidosis: Secondary | ICD-10-CM | POA: Diagnosis present

## 2016-05-17 DIAGNOSIS — E875 Hyperkalemia: Secondary | ICD-10-CM | POA: Diagnosis present

## 2016-05-17 DIAGNOSIS — J9621 Acute and chronic respiratory failure with hypoxia: Secondary | ICD-10-CM | POA: Diagnosis not present

## 2016-05-17 DIAGNOSIS — R739 Hyperglycemia, unspecified: Secondary | ICD-10-CM | POA: Diagnosis present

## 2016-05-17 DIAGNOSIS — J441 Chronic obstructive pulmonary disease with (acute) exacerbation: Secondary | ICD-10-CM | POA: Diagnosis not present

## 2016-05-17 DIAGNOSIS — A419 Sepsis, unspecified organism: Principal | ICD-10-CM | POA: Diagnosis present

## 2016-05-17 DIAGNOSIS — E669 Obesity, unspecified: Secondary | ICD-10-CM | POA: Diagnosis present

## 2016-05-17 DIAGNOSIS — I4891 Unspecified atrial fibrillation: Secondary | ICD-10-CM | POA: Diagnosis present

## 2016-05-17 DIAGNOSIS — R262 Difficulty in walking, not elsewhere classified: Secondary | ICD-10-CM

## 2016-05-17 DIAGNOSIS — R609 Edema, unspecified: Secondary | ICD-10-CM

## 2016-05-17 DIAGNOSIS — J9690 Respiratory failure, unspecified, unspecified whether with hypoxia or hypercapnia: Secondary | ICD-10-CM | POA: Diagnosis not present

## 2016-05-17 DIAGNOSIS — J45902 Unspecified asthma with status asthmaticus: Secondary | ICD-10-CM

## 2016-05-17 DIAGNOSIS — I10 Essential (primary) hypertension: Secondary | ICD-10-CM | POA: Diagnosis not present

## 2016-05-17 DIAGNOSIS — R0602 Shortness of breath: Secondary | ICD-10-CM | POA: Diagnosis not present

## 2016-05-17 DIAGNOSIS — J09X1 Influenza due to identified novel influenza A virus with pneumonia: Secondary | ICD-10-CM | POA: Diagnosis not present

## 2016-05-17 DIAGNOSIS — I5030 Unspecified diastolic (congestive) heart failure: Secondary | ICD-10-CM | POA: Diagnosis not present

## 2016-05-17 HISTORY — DX: Dyspnea, unspecified: R06.00

## 2016-05-17 HISTORY — DX: Chronic obstructive pulmonary disease, unspecified: J44.9

## 2016-05-17 HISTORY — DX: Anxiety disorder, unspecified: F41.9

## 2016-05-17 HISTORY — DX: Essential (primary) hypertension: I10

## 2016-05-17 LAB — COMPREHENSIVE METABOLIC PANEL
ALBUMIN: 3.9 g/dL (ref 3.5–5.0)
ALK PHOS: 74 U/L (ref 38–126)
ALT: 28 U/L (ref 17–63)
AST: 83 U/L — AB (ref 15–41)
Anion gap: 13 (ref 5–15)
BILIRUBIN TOTAL: 0.8 mg/dL (ref 0.3–1.2)
BUN: 23 mg/dL — AB (ref 6–20)
CALCIUM: 8.7 mg/dL — AB (ref 8.9–10.3)
CO2: 26 mmol/L (ref 22–32)
Chloride: 90 mmol/L — ABNORMAL LOW (ref 101–111)
Creatinine, Ser: 1.38 mg/dL — ABNORMAL HIGH (ref 0.61–1.24)
GFR calc Af Amer: >60 mL/min (ref >60)
GFR calc non Af Amer: 52 mL/min — ABNORMAL LOW (ref >60)
GLUCOSE: 170 mg/dL — AB (ref 65–99)
Potassium: 5.4 mmol/L — ABNORMAL HIGH (ref 3.5–5.1)
Sodium: 129 mmol/L — ABNORMAL LOW (ref 135–145)
TOTAL PROTEIN: 8.1 g/dL (ref 6.5–8.1)

## 2016-05-17 LAB — CBC WITH DIFFERENTIAL/PLATELET
Basophils Absolute: 0 10*3/uL (ref 0–0.1)
Basophils Relative: 0 %
EOS PCT: 0 %
Eosinophils Absolute: 0 10*3/uL (ref 0–0.7)
HCT: 46.6 % (ref 40.0–52.0)
HEMOGLOBIN: 15.7 g/dL (ref 13.0–18.0)
LYMPHS PCT: 3 %
Lymphs Abs: 0.8 10*3/uL — ABNORMAL LOW (ref 1.0–3.6)
MCH: 32.3 pg (ref 26.0–34.0)
MCHC: 33.7 g/dL (ref 32.0–36.0)
MCV: 95.8 fL (ref 80.0–100.0)
MONOS PCT: 6 %
Monocytes Absolute: 1.8 10*3/uL — ABNORMAL HIGH (ref 0.2–1.0)
Neutro Abs: 27.5 10*3/uL — ABNORMAL HIGH (ref 1.4–6.5)
Neutrophils Relative %: 91 %
Platelets: 244 10*3/uL (ref 150–440)
RBC: 4.87 MIL/uL (ref 4.40–5.90)
RDW: 13.9 % (ref 11.5–14.5)
WBC: 30.2 10*3/uL — AB (ref 3.8–10.6)

## 2016-05-17 LAB — BLOOD GAS, ARTERIAL
Acid-base deficit: 6.1 mmol/L — ABNORMAL HIGH (ref 0.0–2.0)
Bicarbonate: 23.3 mmol/L (ref 20.0–28.0)
FIO2: 1
O2 SAT: 99.9 %
PCO2 ART: 61 mmHg — AB (ref 32.0–48.0)
PEEP: 10 cmH2O
Patient temperature: 37
RATE: 600 resp/min
VT: 600 mL
pH, Arterial: 7.19 — CL (ref 7.350–7.450)
pO2, Arterial: 308 mmHg — ABNORMAL HIGH (ref 83.0–108.0)

## 2016-05-17 LAB — URINALYSIS, COMPLETE (UACMP) WITH MICROSCOPIC
Bilirubin Urine: NEGATIVE
GLUCOSE, UA: NEGATIVE mg/dL
Hgb urine dipstick: NEGATIVE
KETONES UR: 5 mg/dL — AB
LEUKOCYTES UA: NEGATIVE
Nitrite: NEGATIVE
PROTEIN: 30 mg/dL — AB
SQUAMOUS EPITHELIAL / LPF: NONE SEEN
Specific Gravity, Urine: 1.013 (ref 1.005–1.030)
pH: 5 (ref 5.0–8.0)

## 2016-05-17 LAB — INFLUENZA PANEL BY PCR (TYPE A & B)
INFLBPCR: NEGATIVE
Influenza A By PCR: POSITIVE — AB

## 2016-05-17 LAB — URINE DRUG SCREEN, QUALITATIVE (ARMC ONLY)
Amphetamines, Ur Screen: NOT DETECTED
BARBITURATES, UR SCREEN: NOT DETECTED
BENZODIAZEPINE, UR SCRN: NOT DETECTED
Cannabinoid 50 Ng, Ur ~~LOC~~: NOT DETECTED
Cocaine Metabolite,Ur ~~LOC~~: NOT DETECTED
MDMA (Ecstasy)Ur Screen: NOT DETECTED
METHADONE SCREEN, URINE: NOT DETECTED
Opiate, Ur Screen: POSITIVE — AB
Phencyclidine (PCP) Ur S: NOT DETECTED
TRICYCLIC, UR SCREEN: NOT DETECTED

## 2016-05-17 LAB — ETHANOL: Alcohol, Ethyl (B): <5 mg/dL (ref <5)

## 2016-05-17 LAB — BASIC METABOLIC PANEL
Anion gap: 9 (ref 5–15)
BUN: 22 mg/dL — ABNORMAL HIGH (ref 6–20)
CALCIUM: 7.8 mg/dL — AB (ref 8.9–10.3)
CHLORIDE: 98 mmol/L — AB (ref 101–111)
CO2: 24 mmol/L (ref 22–32)
CREATININE: 1.01 mg/dL (ref 0.61–1.24)
GFR calc non Af Amer: >60 mL/min (ref >60)
Glucose, Bld: 119 mg/dL — ABNORMAL HIGH (ref 65–99)
Potassium: 4.3 mmol/L (ref 3.5–5.1)
Sodium: 131 mmol/L — ABNORMAL LOW (ref 135–145)

## 2016-05-17 LAB — BRAIN NATRIURETIC PEPTIDE: B Natriuretic Peptide: 260 pg/mL — ABNORMAL HIGH (ref 0.0–100.0)

## 2016-05-17 LAB — LACTIC ACID, PLASMA
Lactic Acid, Venous: 4.9 mmol/L (ref 0.5–1.9)
Lactic Acid, Venous: 1.2 mmol/L (ref 0.5–1.9)
Lactic Acid, Venous: 1 mmol/L (ref 0.5–1.9)

## 2016-05-17 LAB — PROTIME-INR
INR: 1.1
Prothrombin Time: 14.2 seconds (ref 11.4–15.2)

## 2016-05-17 LAB — SALICYLATE LEVEL

## 2016-05-17 LAB — TROPONIN I
TROPONIN I: 0.3 ng/mL — AB (ref <0.03)
Troponin I: 0.16 ng/mL (ref <0.03)

## 2016-05-17 LAB — GLUCOSE, CAPILLARY: Glucose-Capillary: 132 mg/dL — ABNORMAL HIGH (ref 65–99)

## 2016-05-17 LAB — ACETAMINOPHEN LEVEL

## 2016-05-17 LAB — PROCALCITONIN: PROCALCITONIN: 2.53 ng/mL

## 2016-05-17 LAB — MRSA PCR SCREENING: MRSA BY PCR: NEGATIVE

## 2016-05-17 MED ORDER — SODIUM CHLORIDE 0.9 % IV BOLUS (SEPSIS)
1000.0000 mL | Freq: Once | INTRAVENOUS | Status: AC
  Administered 2016-05-17: 1000 mL via INTRAVENOUS

## 2016-05-17 MED ORDER — PROPOFOL 10 MG/ML IV BOLUS: 0.5000 mg/kg | Freq: Once | INTRAVENOUS | Status: DC

## 2016-05-17 MED ORDER — IOPAMIDOL (ISOVUE-370) INJECTION 76%
75.0000 mL | Freq: Once | INTRAVENOUS | Status: AC | PRN
  Administered 2016-05-17: 75 mL via INTRAVENOUS

## 2016-05-17 MED ORDER — VANCOMYCIN HCL 10 G IV SOLR
1250.0000 mg | Freq: Two times a day (BID) | INTRAVENOUS | Status: DC
  Administered 2016-05-17 – 2016-05-18 (×2): 1250 mg via INTRAVENOUS
  Filled 2016-05-17 (×3): qty 1250

## 2016-05-17 MED ORDER — OSELTAMIVIR PHOSPHATE 75 MG PO CAPS
75.0000 mg | ORAL_CAPSULE | Freq: Two times a day (BID) | ORAL | Status: AC
  Administered 2016-05-17 – 2016-05-21 (×10): 75 mg via ORAL
  Filled 2016-05-17 (×10): qty 1

## 2016-05-17 MED ORDER — ACETAMINOPHEN 325 MG PO TABS
650.0000 mg | ORAL_TABLET | ORAL | Status: DC | PRN
  Administered 2016-05-18 – 2016-05-20 (×4): 650 mg via ORAL
  Filled 2016-05-17 (×4): qty 2

## 2016-05-17 MED ORDER — FENTANYL CITRATE (PF) 100 MCG/2ML IJ SOLN
50.0000 ug | INTRAMUSCULAR | Status: DC | PRN
  Administered 2016-05-18: 50 ug via INTRAVENOUS
  Filled 2016-05-17 (×2): qty 2

## 2016-05-17 MED ORDER — SODIUM CHLORIDE 0.9 % IV BOLUS (SEPSIS): 1000.0000 mL | Freq: Once | INTRAVENOUS | Status: DC

## 2016-05-17 MED ORDER — PIPERACILLIN-TAZOBACTAM 3.375 G IVPB
3.3750 g | Freq: Three times a day (TID) | INTRAVENOUS | Status: AC
  Administered 2016-05-17 – 2016-05-21 (×13): 3.375 g via INTRAVENOUS
  Filled 2016-05-17 (×13): qty 50

## 2016-05-17 MED ORDER — FAMOTIDINE 20 MG PO TABS
20.0000 mg | ORAL_TABLET | Freq: Every day | ORAL | Status: DC
  Administered 2016-05-17 – 2016-05-24 (×8): 20 mg via ORAL
  Filled 2016-05-17 (×8): qty 1

## 2016-05-17 MED ORDER — PIPERACILLIN-TAZOBACTAM 3.375 G IVPB 30 MIN
3.3750 g | Freq: Once | INTRAVENOUS | Status: AC
  Administered 2016-05-17: 3.375 g via INTRAVENOUS
  Filled 2016-05-17: qty 50

## 2016-05-17 MED ORDER — VANCOMYCIN HCL 10 G IV SOLR
1500.0000 mg | Freq: Once | INTRAVENOUS | Status: AC
  Administered 2016-05-17: 1500 mg via INTRAVENOUS
  Filled 2016-05-17: qty 1500

## 2016-05-17 MED ORDER — PNEUMOCOCCAL VAC POLYVALENT 25 MCG/0.5ML IJ INJ: 0.5000 mL | INJECTION | INTRAMUSCULAR | Status: DC | PRN

## 2016-05-17 MED ORDER — FENTANYL CITRATE (PF) 100 MCG/2ML IJ SOLN
50.0000 ug | INTRAMUSCULAR | Status: DC | PRN
  Administered 2016-05-18 – 2016-05-20 (×6): 50 ug via INTRAVENOUS
  Filled 2016-05-17 (×6): qty 2

## 2016-05-17 MED ORDER — SODIUM CHLORIDE 0.9 % IV SOLN
250.0000 mL | INTRAVENOUS | Status: DC | PRN
  Administered 2016-05-17: 250 mL via INTRAVENOUS

## 2016-05-17 MED ORDER — ONDANSETRON HCL 4 MG/2ML IJ SOLN
4.0000 mg | Freq: Four times a day (QID) | INTRAMUSCULAR | Status: DC | PRN
  Administered 2016-05-21: 4 mg via INTRAVENOUS
  Filled 2016-05-17: qty 2

## 2016-05-17 MED ORDER — INFLUENZA VAC SPLIT QUAD 0.5 ML IM SUSY: 0.5000 mL | PREFILLED_SYRINGE | INTRAMUSCULAR | Status: DC | PRN

## 2016-05-17 MED ORDER — ENOXAPARIN SODIUM 40 MG/0.4ML ~~LOC~~ SOLN
40.0000 mg | SUBCUTANEOUS | Status: DC
  Administered 2016-05-17: 40 mg via SUBCUTANEOUS
  Filled 2016-05-17: qty 0.4

## 2016-05-17 NOTE — ED Notes (Signed)
Triage information collected from wife, Joyce GrossKay.

## 2016-05-17 NOTE — ED Notes (Signed)
CODE  SEPSIS  CALLED  TO  [facility] 

## 2016-05-17 NOTE — ED Provider Notes (Addendum)
[facility] Emergency Department Provider Note  ____________________________________________   I have reviewed the triage vital signs and the nursing notes.   HISTORY  Chief Complaint Respiratory Distress    HPI [name] is a 66 y.o. male who has a history ofCOPD on home oxygen CHF A. fib and multiple other medical problems presents today unresponsive. According to EMS, he was slumped over on the toilet. There was no fall or head injury. He was not responsive. He did have a gag however did not call her in a [place] airway. EMS found him with a sat in the 70s on his home oxygen. According to EMS, there is been flu in the family that he himself has not been significantly ill to the extent that has been reported. In any event, they were able to administer supplemental oxygen and they got his sats up and brought him in. IV was established. He was given Narcan with no response of any significance. Blood sugar was normal. There was no trauma. The patient is unable to give a history. Level 5 chart caveat; no further history available due to patient status.      Past Medical History:  Diagnosis Date  . Asthma   . CHF (congestive heart failure) [facility])     Patient Active Problem List   Diagnosis Date Noted  . A-fib (HCC) 10/03/2014  . Chronic asthmatic bronchitis with acute exacerbation (HCC) 10/03/2014  . Chronic respiratory failure (HCC) 10/03/2014  . CAFL (chronic airflow limitation) (HCC) 10/03/2014  . Cervical osteoarthritis 10/03/2014  . Chronic obstructive pulmonary emphysema (HCC) 10/03/2014  . Fatigue 10/03/2014  . History of atrial fibrillation 10/03/2014  . Obstructive apnea 10/03/2014  . Disturbance of skin sensation 10/03/2014  . Edema, peripheral 10/03/2014  . CCF (congestive cardiac failure) (HCC) 03/26/2014  . Chronic obstructive pulmonary disease (HCC) 09/21/2013  . Carbuncle and furuncle of buttock 12/04/2009  . Elevated WBC count  01/09/2009  . Hypoxemia 12/10/2008  . 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 Accumulation of fluid in tissues 03/31/2006  . Esophagitis, reflux 03/31/2006  . Decreased potassium in the blood 03/31/2006  . Adiposity 03/31/2006  . Primary localized osteoarthrosis, lower leg 03/31/2006  . Apnea, sleep 03/31/2006    Past Surgical History:  Procedure Laterality Date  . APPENDECTOMY  1974  . CHOLECYSTECTOMY  1974  . NASAL SINUS SURGERY  [phone]  . TONSILLECTOMY AND ADENOIDECTOMY  1970  . VASECTOMY  1979    Prior to Admission medications   Medication Sig Start Date End Date Taking? Authorizing Provider  ADVAIR DISKUS 500-50 MCG/DOSE AEPB INHALE 1 PUFF TWICE DAILY.  RINSE MOUTH AFTER EACH USE 03/03/16   Jodell Cipro Chrismon, PA  albuterol (PROVENTIL HFA;VENTOLIN HFA) 108 (90 BASE) MCG/ACT inhaler Inhale 2 puffs into the lungs 2 (two) times daily.    Historical Provider, MD  aspirin 81 MG tablet Take 1 tablet by mouth daily.    Historical Provider, MD  BIOFLAVONOID PRODUCTS PO Take 1 tablet by mouth daily.    Historical Provider, MD  COMBIVENT RESPIMAT 20-100 MCG/ACT AERS respimat USE ONE PUFF FOUR TIMES DAILY 12/02/14   Jodell Cipro Chrismon, PA  diltiazem (CARDIZEM CD) 240 MG 24 hr capsule Take by mouth. 09/25/15   Historical Provider, MD  diltiazem (TIAZAC) 240 MG 24 hr capsule Take 1 capsule by mouth daily. Reported on 10/24/2015    Historical Provider, MD  furosemide (LASIX) 40 MG tablet TAKE ONE TABLET TWICE DAILY AS NEEDED 11/18/15  Dennis E Chrismon, PA  ibuprofen (ADVIL,MOTRIN) 200 MG tablet Take 1 tablet by mouth as needed.    Historical Provider, MD  ibuprofen (ADVIL,MOTRIN) 400 MG tablet Take 1 tablet by mouth 3 (three) times daily. Reported on 10/24/2015    Historical Provider, MD  metoprolol (LOPRESSOR) 50 MG tablet  10/02/15   Historical Provider, MD  montelukast (SINGULAIR) 10 MG tablet TAKE ONE TABLET AT BEDTIME 02/25/16   Jodell Ciproennis E Chrismon, PA   morphine 20 MG/5ML solution Take 20 mg by mouth every 4 (four) hours as needed.    Historical Provider, MD  MULTIPLE VITAMIN PO Take 1 tablet by mouth daily.    Historical Provider, MD  naproxen sodium (ANAPROX) 220 MG tablet Take 1 tablet by mouth as needed. Reported on 10/24/2015    Historical Provider, MD  potassium chloride (MICRO-K) 10 MEQ CR capsule Take 1 capsule by mouth as needed. Reported on 08/29/2015 07/22/13   Historical Provider, MD  potassium chloride SA (K-DUR,KLOR-CON) 20 MEQ tablet Take 1 tablet by mouth daily. Reported on 10/24/2015 06/27/14   Historical Provider, MD  predniSONE (DELTASONE) 10 MG tablet TAKE ONE (1) TABLET EACH DAY 02/13/16   Jodell Ciproennis E Chrismon, PA  ranitidine (ZANTAC) 150 MG tablet TAKE ONE TABLET EVERY 12 HOURS 01/16/16   Jodell Ciproennis E Watkinshrismon, PA  TUDORZA PRESSAIR 400 MCG/ACT AEPB  09/04/14   Historical Provider, MD    Allergies Aspirin and Sulfa antibiotics  Family History  Problem Relation Age of Onset  . Heart disease Mother   . Congestive Heart Failure Father     Social History Social History  Substance Use Topics  . Smoking status: Former Games developermoker  . Smokeless tobacco: Never Used  . Alcohol use No    Review of Systems Level 5 chart caveat; no further history available due to patient status. ____________________________________________   PHYSICAL EXAM:  VITAL SIGNS: ED Triage Vitals  Enc Vitals Group     BP      Pulse      Resp      Temp      Temp src      SpO2      Weight      Height      Head Circumference      Peak Flow      Pain Score      Pain Loc      Pain Edu?      Excl. in GC?     Constitutional: Patient nonresponsive, does open his eyes but will not respond to pain, patient with gasping respirations Eyes: Conjunctivae are normal. PERRL. EOMI. Head: Atraumatic. Nose: No congestion/rhinnorhea. Mouth/Throat: Mucous membranes are moist.  Oropharynx non-erythematous. Neck: No obvious injury Cardiovascular: Tachycardia noted,  no auscultated murmurs rubs or gallops Respiratory: Agonal respirations, lungs rhonchorous Abdominal: No obvious distention or mass Musculoskeletal: No obvious deformity Neurologic:  Cannot assess nonresponsive does have spontaneous respirations was able to open his eyes spontaneously no obvious focality Skin:  Skin is warm, dry and intact. No rash noted.   ____________________________________________   LABS (all labs ordered are listed, but only abnormal results are displayed)  Labs Reviewed  CULTURE, BLOOD (ROUTINE X 2)  CULTURE, BLOOD (ROUTINE X 2)  CBC WITH DIFFERENTIAL/PLATELET  LACTIC ACID, PLASMA  LACTIC ACID, PLASMA  COMPREHENSIVE METABOLIC PANEL  TROPONIN I  PROTIME-INR  BRAIN NATRIURETIC PEPTIDE  URINALYSIS, COMPLETE (UACMP) WITH MICROSCOPIC  INFLUENZA PANEL BY PCR (TYPE A & B)   ____________________________________________  EKG  I personally interpreted  any EKGs ordered by me or triage Sinus rhythm rate 87 bpm no acute ST elevation or depression,severe sutures is normal axis ____________________________________________  RADIOLOGY  I reviewed any imaging ordered by me or triage that were performed during my shift and, if possible, patient and/or family made aware of any abnormal findings. ____________________________________________   PROCEDURES  Procedure(s) performed: INTUBATION Performed by: Jeanmarie Plant  Required items: required blood products, implants, devices, and special equipment available Patient identity confirmed: provided demographic data and hospital-assigned identification number Time out: Immediately prior to procedure a "time out" was called to verify the correct patient, procedure, equipment, support staff and site/side marked as required.  Indications: Respiratory failure   Intubation method: Glidescope Laryngoscopy   Preoxygenation: 100% BVM  Sedatives: 20 Etomidate Paralytic:  100 rocuronium  Tube Size: 7.5  cuffed  Post-procedure assessment: chest rise and ETCO2 monitor Breath sounds: equal and absent over the epigastrium Tube secured with: ETT holder Chest x-ray interpreted by radiologist and me.  Chest x-ray findings: endotracheal tube in appropriate position  Patient tolerated the procedure well with no immediate complications.     Procedures  Critical Care performed: CRITICAL CARE Performed by: Jeanmarie Plant   Total critical care time: 60 minutes  Critical care time was exclusive of separately billable procedures and treating other patients.  Critical care was necessary to treat or prevent imminent or life-threatening deterioration.  Critical care was time spent personally by me on the following activities: development of treatment plan with patient and/or surrogate as well as nursing, discussions with consultants, evaluation of patient's response to treatment, examination of patient, obtaining history from patient or surrogate, ordering and performing treatments and interventions, ordering and review of laboratory studies, ordering and review of radiographic studies, pulse oximetry and re-evaluation of patient's condition.   ____________________________________________   INITIAL IMPRESSION / ASSESSMENT AND PLAN / ED COURSE  Pertinent labs & imaging results that were available during my care of the patient were reviewed by me and considered in my medical decision making (see chart for details).  Patient arrives in respiratory failure emergent intubation performed. After intubation, thick yellow purulent-appearing mucus filled up the ET tube. This has been suctioned. We are giving him IV fluid antibiotics and we are assessing him further for the cause of his decompensation.  ----------------------------------------- 1:05 PM on 05/17/2016 -----------------------------------------  Patient with signs and symptoms of pneumonia and sepsis. We are treating him aggressively.  Could sepsis has been called. We'll follow hospital protocols for IV fluid administration for sepsis. Patient's tolerating the tube well at this time. Radiology reads it infiltrate. I would came out of his ET tube I suspect this is a pneumonia however we will obtain CT scan if we can stabilize the patient. ICU has been made aware of all of her findings and will admit the patient they agree with our processes so far. We are giving a broad-spectrum antibiotics.  Clinical Course    ____________________________________________   FINAL CLINICAL IMPRESSION(S) / ED DIAGNOSES  Final diagnoses:  None      This chart was dictated using voice recognition software.  Despite best efforts to proofread,  errors can occur which can change meaning.      Jeanmarie Plant, MD 05/17/16 1236    Jeanmarie Plant, MD 05/17/16 [phone]

## 2016-05-17 NOTE — Progress Notes (Signed)
Pharmacy Antibiotic Note  [name] is a 66 y.o. male with a h/o COPD and CHF admitted on 05/17/2016 with pneumonia. Patient is influenza positive. Pharmacy has been consulted for vancomycin and Zosyn dosing.  Plan: Zosyn 3.375 g EI q 8 hours.   Vancomycin 1500 mg iv once followed by 1250 mg iv q 12 hours with stacked dosing and a trough tentatively scheduled with the 5th total dose. Goal trough 15-20 mcg/ml.   Height: 5\' 10"  (177.8 cm) Weight: 298 lb (135.2 kg) IBW/kg (Calculated) : 73  Temp (24hrs), Avg:98.6 F (37 C), Min:98 F (36.7 C), Max:99.5 F (37.5 C)   Recent Labs Lab 05/17/16 1215  WBC 30.2*  CREATININE 1.38*  LATICACIDVEN 4.9*    Estimated Creatinine Clearance: 73.9 mL/min (by C-G formula based on SCr of 1.38 mg/dL (H)).    Allergies  Allergen Reactions  . Aspirin     Other reaction(s): Asthma flare up  . Sulfa Antibiotics     Antimicrobials this admission: vancomycin 1/15 >>  Zosyn 1/15 >>   Dose adjustments this admission:   Microbiology results: 1/15 BCx: pending 1/15 UCx: pending Sputum: pending MRSA PCR: pending  Thank you for allowing pharmacy to be a part of this patient's care.  Luisa HartChristy, Lot Medford D 05/17/2016 3:21 PM

## 2016-05-17 NOTE — Progress Notes (Signed)
eLink Physician-Brief Progress Note Patient Name: [name] DOB: 1950/07/07 MRN: [id number]   Date of Service  05/17/2016  HPI/Events of Note  Patient is a code sepsis - notified that patient needs further Lactic Acid levels and sepsis repeat assessment note.   eICU Interventions  Will order: 1. Lactic Acid level Q 3 hours X 3. Start at 6:16 PM. 2. Notified Dr. Nicholos Johnsamachandran that the patient needs sepsis reassessment note.         [name] 05/17/2016, 5:09 PM

## 2016-05-17 NOTE — Progress Notes (Signed)
Pt. Was transported from ED to CCU while on the vent

## 2016-05-17 NOTE — H&P (Signed)
[facility] Critical Care Medicine H&P   ASSESSMENT/PLAN   66 yo male with acute respiratory failure with pneumonia; flu +.   PULMONARY A:Acute hypoxic and acute hypercapnic respiratory failure. -Review of chest x-ray images 1/15 shows right mid zone pneumonia. -1/15: Influenza A+ COPD  P:   Empiric coverage of ammonia with vancomycin and Zosyn. -Tamiflu for influenza pneumonia. -DuoNeb's.  CARDIOVASCULAR A: History of CHF. Hypertension. Septic shock, initial lactic acid equals 4.9. P:  Restart home hypertensive medications when appropriate. IV fluid resuscitation for sepsis protocol.  RENAL A:  Acute kidney injury with hyponatremia and hyperkalemia. P:   Continue IV fluid resuscitation. -Recheck serum potassium later today.  GASTROINTESTINAL A:  GI prophylaxis P:     HEMATOLOGIC A:  Leukocytosis secondary to sepsis/pneumonia. P:  -Continue current management.  INFECTIOUS A:  Pneumonia, influenza and or bacterial superinfection. P:    Micro/culture results:  BCx2 pending UC pending Sputum pending  Antibiotics: 1/15. Zosyn>> 1/15. Vancomycin>> 1./15. Tamiflu>> 1/20.  ENDOCRINE A:   P:   Will monitor blood sugars.  NEUROLOGIC A:  -- -Fentanyl when necessary sedation.   MAJOR EVENTS/TEST RESULTS:   Best Practices  DVT Prophylaxis: Enoxaparin. GI Prophylaxis: Famotidine.   ---------------------------------------  ---------------------------------------   Name: [name] MRN: [id number] DOB: [birth year]    ADMISSION DATE:  05/17/2016   CHIEF COMPLAINT:  Dyspnea.    HISTORY OF PRESENT ILLNESS:    Patient is a 66 year old male, he is currently intubated and on the ventilator, therefore, all history was obtained from the chart, from staff, and the patient's wife was at the bedside. Patient's wife had noted that she had flulike symptoms approximately one week ago, patient started noticing symptoms approximately 3 days ago,  he noted that he was having increasing cough, dyspnea and fatigue. These symptoms progressed over the last 3 days until last night. He was very fatigued. He was too uncomfortable to go to sleep in his bed due to difficulty breathing when laying down, he therefore sat at the kitchen table in order to breathe better. She last saw him in the evening around midnight, she then saw him around 6 AM. At that time, he was a lethargic but arousable, they then called EMS where was noted that his oxygen saturation was 70%. He was brought to the emergency room where chest x-ray showed an infarct which was noted to be "wedge-shaped". Subsequently the ED doctor intubated the patient, due to severe lethargy and respiratory distress, upon intubation was noted. Patient had copious thick brown secretions thought to be consistent with the patient's presentation of likely pneumonia. The patient's flu swab was positive for influenza A.  PAST MEDICAL HISTORY :  Past Medical History:  Diagnosis Date  . Asthma   . CHF (congestive heart failure) (HCC)   . COPD (chronic obstructive pulmonary disease) (HCC)    Past Surgical History:  Procedure Laterality Date  . APPENDECTOMY  1974  . CHOLECYSTECTOMY  1974  . NASAL SINUS SURGERY  [phone]  . TONSILLECTOMY AND ADENOIDECTOMY  1970  . VASECTOMY  1979   Prior to Admission medications   Medication Sig Start Date End Date Taking? Authorizing Provider  ADVAIR DISKUS 500-50 MCG/DOSE AEPB INHALE 1 PUFF TWICE DAILY.  RINSE MOUTH AFTER EACH USE 03/03/16   Jodell Cipro Chrismon, PA  albuterol (PROVENTIL HFA;VENTOLIN HFA) 108 (90 BASE) MCG/ACT inhaler Inhale 2 puffs into the lungs 2 (two) times daily.    Historical Provider, MD  aspirin 81 MG tablet Take 1  tablet by mouth daily.    Historical Provider, MD  BIOFLAVONOID PRODUCTS PO Take 1 tablet by mouth daily.    Historical Provider, MD  COMBIVENT RESPIMAT 20-100 MCG/ACT AERS respimat USE ONE PUFF FOUR TIMES DAILY 12/02/14   Jodell Cipro  Chrismon, PA  diltiazem (CARDIZEM CD) 240 MG 24 hr capsule Take by mouth. 09/25/15   Historical Provider, MD  diltiazem (TIAZAC) 240 MG 24 hr capsule Take 1 capsule by mouth daily. Reported on 10/24/2015    Historical Provider, MD  furosemide (LASIX) 40 MG tablet TAKE ONE TABLET TWICE DAILY AS NEEDED 11/18/15   Jodell Cipro Chrismon, PA  ibuprofen (ADVIL,MOTRIN) 200 MG tablet Take 1 tablet by mouth as needed.    Historical Provider, MD  ibuprofen (ADVIL,MOTRIN) 400 MG tablet Take 1 tablet by mouth 3 (three) times daily. Reported on 10/24/2015    Historical Provider, MD  metoprolol (LOPRESSOR) 50 MG tablet  10/02/15   Historical Provider, MD  montelukast (SINGULAIR) 10 MG tablet TAKE ONE TABLET AT BEDTIME 02/25/16   Jodell Cipro Chrismon, PA  morphine 20 MG/5ML solution Take 20 mg by mouth every 4 (four) hours as needed.    Historical Provider, MD  MULTIPLE VITAMIN PO Take 1 tablet by mouth daily.    Historical Provider, MD  naproxen sodium (ANAPROX) 220 MG tablet Take 1 tablet by mouth as needed. Reported on 10/24/2015    Historical Provider, MD  potassium chloride (MICRO-K) 10 MEQ CR capsule Take 1 capsule by mouth as needed. Reported on 08/29/2015 07/22/13   Historical Provider, MD  potassium chloride SA (K-DUR,KLOR-CON) 20 MEQ tablet Take 1 tablet by mouth daily. Reported on 10/24/2015 06/27/14   Historical Provider, MD  predniSONE (DELTASONE) 10 MG tablet TAKE ONE (1) TABLET EACH DAY 02/13/16   Jodell Cipro Chrismon, PA  ranitidine (ZANTAC) 150 MG tablet TAKE ONE TABLET EVERY 12 HOURS 01/16/16   Jodell Cipro Arnett, PA  TUDORZA PRESSAIR 400 MCG/ACT AEPB  09/04/14   Historical Provider, MD   Allergies  Allergen Reactions  . Aspirin     Other reaction(s): Asthma flare up  . Sulfa Antibiotics     FAMILY HISTORY:  Family History  Problem Relation Age of Onset  . Heart disease Mother   . Congestive Heart Failure Father    SOCIAL HISTORY:  reports that he has quit smoking. He has never used smokeless tobacco. He  reports that he does not drink alcohol or use drugs.  REVIEW OF SYSTEMS:   Could not be obtained due to critical illness.   VITAL SIGNS: Temp:  [99.1 F (37.3 C)-99.5 F (37.5 C)] 99.3 F (37.4 C) (01/15 1320) Pulse Rate:  [72-92] 72 (01/15 1320) Resp:  [16-25] 21 (01/15 1320) BP: (95-160)/(55-84) 113/62 (01/15 1320) SpO2:  [95 %-100 %] 100 % (01/15 1320) FiO2 (%):  [100 %] 100 % (01/15 1215) Weight:  [298 lb (135.2 kg)] 298 lb (135.2 kg) (01/15 1215) HEMODYNAMICS:   VENTILATOR SETTINGS: Vent Mode: AC FiO2 (%):  [100 %] 100 % Set Rate:  [20 bmp] 20 bmp Vt Set:  [600 mL] 600 mL PEEP:  [10 cmH20] 10 cmH20 INTAKE / OUTPUT:  Intake/Output Summary (Last 24 hours) at 05/17/16 1349 Last data filed at 05/17/16 1246  Gross per 24 hour  Intake             1000 ml  Output                0 ml  Net  1000 ml    Physical Examination:   VS: BP 113/62   Pulse 72   Temp 99.3 F (37.4 C)   Resp (!) 21   Ht 5\' 10"  (1.778 m)   Wt 298 lb (135.2 kg)   SpO2 100%   BMI 42.76 kg/m   General Appearance: No distress  Neuro:without focal findings, mental status, Unresponsive. HEENT: PERRLA, EOM intact, no ptosis, no other lesions noticed;  Pulmonary: Decreased breath sounds bilaterally, bilateral rhonchi heard. CardiovascularNormal S1,S2.  No m/r/g.    Abdomen: Benign, Soft, non-tender, No masses, hepatosplenomegaly, obese. Renal:  No costovertebral tenderness  GU:  Not performed at this time. Endoc: No evident thyromegaly, no signs of acromegaly. Skin:   warm, no rashes, no ecchymosis  Extremities: normal, no cyanosis, clubbing, no edema, warm with normal capillary refill.    LABS: Reviewed   LABORATORY PANEL:   CBC  Recent Labs Lab 05/17/16 1215  WBC 30.2*  HGB 15.7  HCT 46.6  PLT 244    Chemistries   Recent Labs Lab 05/17/16 1215  NA 129*  K 5.4*  CL 90*  CO2 26  GLUCOSE 170*  BUN 23*  CREATININE 1.38*  CALCIUM 8.7*  AST 83*  ALT 28    ALKPHOS 74  BILITOT 0.8    No results for input(s): GLUCAP in the last 168 hours.  Recent Labs Lab 05/17/16 1226  PHART 7.19*  PCO2ART 61*  PO2ART 308*    Recent Labs Lab 05/17/16 1215  AST 83*  ALT 28  ALKPHOS 74  BILITOT 0.8  ALBUMIN 3.9    Cardiac Enzymes  Recent Labs Lab 05/17/16 1215  TROPONINI 0.16*    RADIOLOGY:  Dg Abdomen 1 View  Result Date: 05/17/2016 CLINICAL DATA:  OG tube placement EXAM: ABDOMEN - 1 VIEW COMPARISON:  CT abdomen 11/07/2015 FINDINGS: Nasogastric tube with the tip projecting of the stomach, but recommend advancing the tube 8 cm. There is no bowel dilatation to suggest obstruction. There is no evidence of pneumoperitoneum, portal venous gas or pneumatosis. There are no pathologic calcifications along the expected course of the ureters. The osseous structures are unremarkable. IMPRESSION: Nasogastric tube with the tip projecting of the stomach, but recommend advancing the tube 8 cm. Electronically Signed   By: Elige KoHetal  Patel   On: 05/17/2016 13:23   Dg Chest Portable 1 View  Result Date: 05/17/2016 CLINICAL DATA:  Unresponsive. EXAM: PORTABLE CHEST 1 VIEW COMPARISON:  CT 09/30/2015.  Chest x-ray 09/22/2015. FINDINGS: Endotracheal tube noted with its tip 5 cm above the carina. Cardiomegaly with normal pulmonary vascularity. Bibasilar atelectasis. Bibasilar pulmonary infiltrates cannot be excluded. Wedge-shaped peripheral density in the right mid lung. This could represent a focal infiltrate. Infarct cannot be excluded . Contrast-enhanced CT of the chest should be considered for further evaluation in order to exclude pulmonary embolus. No pleural effusion or pneumothorax. IMPRESSION: 1. Endotracheal tube noted in good anatomic position. 2. Low lung volumes with bibasilar atelectasis. Bibasilar infiltrates cannot be excluded. 3. Right mid lung peripheral wedge-shaped density. This could represent a focal infiltrate. Infarct cannot be excluded.  Contrast-enhanced CT of the chest should be considered to exclude pulmonary embolus. Electronically Signed   By: Maisie Fushomas  Register   On: 05/17/2016 12:38       --Wells Guileseep Lilyian Quayle, MD.  Board Certified in Internal Medicine, Pulmonary Medicine, Critical Care Medicine, and Sleep Medicine.  ICU Pager [phone] [place] [facility] Office Number: [phone]  Santiago Gladavid Kasa, M.D.  Stephanie AcreVishal Mungal, M.D.  Onalee Huaavid  Sung Amabile, M.D   05/17/2016, 1:49 PM   Critical Care Attestation.  I have personally obtained a history, examined the patient, evaluated laboratory and imaging results, formulated the assessment and plan and placed orders. The Patient requires high complexity decision making for assessment and support, frequent evaluation and titration of therapies, application of advanced monitoring technologies and extensive interpretation of multiple databases. The patient has critical illness that could lead imminently to failure of 1 or more organ systems and requires the highest level of physician preparedness to intervene.  Critical Care Time devoted to patient care services described in this note is 45 minutes and is exclusive of time spent in procedures.

## 2016-05-17 NOTE — Progress Notes (Signed)
Sepsis - Repeat Assessment  Performed at:    22:57  Vitals     Blood pressure 102/61, pulse 82, temperature 99.5 F (37.5 C), resp. rate 20, height 5\' 8"  (1.727 m), weight 129.4 kg (285 lb 4.4 oz), SpO2 93 %.  Heart:     Regular rate and rhythm  Lungs:    Rhonchi  Capillary Refill:   <2 sec  Peripheral Pulse:   Radial pulse palpable and Dorsalis pedis pulse  palpable  Skin:     Normal Color   Lactic acidosis resolved most recent lactic acid 1.0.  [name], AGNP  Pulmonary/Critical Care Pager [phone] (please enter 7 digits) PCCM Consult Pager [phone] (please enter 7 digits)

## 2016-05-17 NOTE — ED Notes (Signed)
Pt BP 86/70, MD made aware.

## 2016-05-17 NOTE — ED Triage Notes (Signed)
Pt arrived to ED from home via [facility] c/o respiratory distress. Per EMS pt found slumped over on toilet sating at 70% on [address] [place]; pupils were pinpoint and 2 of narcan administered.

## 2016-05-17 NOTE — ED Notes (Signed)
Pt in respiratory distress requiring intubation; 20 mg of etomidate followed by 100 mg of rocuronim administered; pt observed biting at tube during placement and an additional 10 mg of vecuronium administered. Pt succesfully intubated at 1213 with a 7 1/2 tube, 25 at the lip.

## 2016-05-18 ENCOUNTER — Inpatient Hospital Stay: Payer: Medicare Other

## 2016-05-18 DIAGNOSIS — J9622 Acute and chronic respiratory failure with hypercapnia: Secondary | ICD-10-CM

## 2016-05-18 DIAGNOSIS — J9621 Acute and chronic respiratory failure with hypoxia: Secondary | ICD-10-CM

## 2016-05-18 DIAGNOSIS — J441 Chronic obstructive pulmonary disease with (acute) exacerbation: Secondary | ICD-10-CM

## 2016-05-18 LAB — BLOOD GAS, ARTERIAL
ACID-BASE DEFICIT: 1.3 mmol/L (ref 0.0–2.0)
ACID-BASE DEFICIT: 3 mmol/L — AB (ref 0.0–2.0)
BICARBONATE: 28.8 mmol/L — AB (ref 20.0–28.0)
BICARBONATE: 25.1 mmol/L (ref 20.0–28.0)
FIO2: 0.7
FIO2: 0.7
MECHANICAL RATE: 20
MECHANICAL RATE: 26
MECHVT: 600 mL
O2 Saturation: 94.7 %
O2 Saturation: 97.8 %
PATIENT TEMPERATURE: 37
PEEP/CPAP: 10 cmH2O
PH ART: 7.26 — AB (ref 7.350–7.450)
Patient temperature: 37
VT: 600 mL
pCO2 arterial: 72 mmHg (ref 32.0–48.0)
pCO2 arterial: 56 mmHg — ABNORMAL HIGH (ref 32.0–48.0)
pH, Arterial: 7.21 — ABNORMAL LOW (ref 7.350–7.450)
pO2, Arterial: 89 mmHg (ref 83.0–108.0)
pO2, Arterial: 115 mmHg — ABNORMAL HIGH (ref 83.0–108.0)

## 2016-05-18 LAB — CBC
HCT: 42.2 % (ref 40.0–52.0)
Hemoglobin: 14.5 g/dL (ref 13.0–18.0)
MCH: 32.4 pg (ref 26.0–34.0)
MCHC: 34.5 g/dL (ref 32.0–36.0)
MCV: 94 fL (ref 80.0–100.0)
PLATELETS: 164 10*3/uL (ref 150–440)
RBC: 4.49 MIL/uL (ref 4.40–5.90)
RDW: 14.1 % (ref 11.5–14.5)
WBC: 26.9 10*3/uL — ABNORMAL HIGH (ref 3.8–10.6)

## 2016-05-18 LAB — URINE CULTURE: Culture: NO GROWTH

## 2016-05-18 LAB — BASIC METABOLIC PANEL
Anion gap: 8 (ref 5–15)
BUN: 29 mg/dL — AB (ref 6–20)
CHLORIDE: 102 mmol/L (ref 101–111)
CO2: 27 mmol/L (ref 22–32)
CREATININE: 1.28 mg/dL — AB (ref 0.61–1.24)
Calcium: 8.2 mg/dL — ABNORMAL LOW (ref 8.9–10.3)
GFR calc Af Amer: >60 mL/min (ref >60)
GFR calc non Af Amer: 57 mL/min — ABNORMAL LOW (ref >60)
GLUCOSE: 88 mg/dL (ref 65–99)
POTASSIUM: 4.5 mmol/L (ref 3.5–5.1)
SODIUM: 137 mmol/L (ref 135–145)

## 2016-05-18 LAB — MAGNESIUM: MAGNESIUM: 2 mg/dL (ref 1.7–2.4)

## 2016-05-18 LAB — LACTIC ACID, PLASMA: LACTIC ACID, VENOUS: 1 mmol/L (ref 0.5–1.9)

## 2016-05-18 LAB — GLUCOSE, CAPILLARY
GLUCOSE-CAPILLARY: 146 mg/dL — AB (ref 65–99)
Glucose-Capillary: 156 mg/dL — ABNORMAL HIGH (ref 65–99)

## 2016-05-18 LAB — PROCALCITONIN: PROCALCITONIN: 5.3 ng/mL

## 2016-05-18 MED ORDER — VITAL HIGH PROTEIN PO LIQD
1000.0000 mL | ORAL | Status: DC
  Administered 2016-05-18 – 2016-05-19 (×2): 1000 mL

## 2016-05-18 MED ORDER — IPRATROPIUM-ALBUTEROL 0.5-2.5 (3) MG/3ML IN SOLN
3.0000 mL | RESPIRATORY_TRACT | Status: DC
  Administered 2016-05-18 – 2016-05-21 (×17): 3 mL via RESPIRATORY_TRACT
  Filled 2016-05-18 (×18): qty 3

## 2016-05-18 MED ORDER — ENOXAPARIN SODIUM 40 MG/0.4ML ~~LOC~~ SOLN
40.0000 mg | Freq: Two times a day (BID) | SUBCUTANEOUS | Status: DC
  Administered 2016-05-18 – 2016-05-24 (×13): 40 mg via SUBCUTANEOUS
  Filled 2016-05-18 (×13): qty 0.4

## 2016-05-18 MED ORDER — SODIUM CHLORIDE 0.9 % IV SOLN
INTRAVENOUS | Status: DC
  Administered 2016-05-18 – 2016-05-20 (×4): via INTRAVENOUS

## 2016-05-18 MED ORDER — METHYLPREDNISOLONE SODIUM SUCC 125 MG IJ SOLR
60.0000 mg | Freq: Three times a day (TID) | INTRAMUSCULAR | Status: DC
  Administered 2016-05-18 – 2016-05-21 (×9): 60 mg via INTRAVENOUS
  Filled 2016-05-18 (×9): qty 2

## 2016-05-18 MED ORDER — CHLORHEXIDINE GLUCONATE 0.12% ORAL RINSE (MEDLINE KIT)
15.0000 mL | Freq: Two times a day (BID) | OROMUCOSAL | Status: DC
  Administered 2016-05-18 – 2016-05-20 (×6): 15 mL via OROMUCOSAL

## 2016-05-18 MED ORDER — ORAL CARE MOUTH RINSE
15.0000 mL | Freq: Four times a day (QID) | OROMUCOSAL | Status: DC
  Administered 2016-05-18 – 2016-05-21 (×11): 15 mL via OROMUCOSAL

## 2016-05-18 NOTE — Progress Notes (Signed)
Pharmacy Antibiotic Note  [name] is a 66 y.o. male with a h/o COPD and CHF admitted on 05/17/2016 with pneumonia. Patient is influenza positive. Pharmacy has been consulted for Zosyn dosing.  Plan: Continue Zosyn 3.375 g EI q 8 hours. Stop date ordered.   MRSA PCR negative--> vancomycin D/C'd  Height: 5\' 8"  (172.7 cm) Weight: 289 lb 3.9 oz (131.2 kg) IBW/kg (Calculated) : 68.4  Temp (24hrs), Avg:99.4 F (37.4 C), Min:98 F (36.7 C), Max:101.5 F (38.6 C)   Recent Labs Lab 05/17/16 1215 05/17/16 1802 05/17/16 2106 05/17/16 2354 05/18/16 0653  WBC 30.2*  --   --   --  26.9*  CREATININE 1.38* 1.01  --   --  1.28*  LATICACIDVEN 4.9* 1.2 1.0 1.0  --     Estimated Creatinine Clearance: 76.1 mL/min (by C-G formula based on SCr of 1.28 mg/dL (H)).    Allergies  Allergen Reactions  . Aspirin     Other reaction(s): Asthma flare up  . Sulfa Antibiotics     Antimicrobials this admission: vancomycin 1/15 >> 1/16 Zosyn 1/15 >>   Dose adjustments this admission:   Microbiology results: 1/15 BCx: NGTD 1/15 UCx: pending Sputum: pending MRSA PCR: negative  Thank you for allowing pharmacy to be a part of this patient's care.  Delsa BernKelly m Maryssa Giampietro, PharmD 05/18/2016 12:05 PM

## 2016-05-18 NOTE — H&P (Signed)
[facility] [place] [facility] Medicine H&P   ASSESSMENT/PLAN   66 yo male with acute respiratory failure with pneumonia; flu +.   PULMONARY A:Acute hypoxic and acute hypercapnic respiratory failure. -Review of chest x-ray images 1/16 continued right mid zone pneumonia. -1/15: Influenza A+ COPD  P:   Empiric coverage of a pneumonia with Zosyn. -Tamiflu for influenza pneumonia. --DC vanco -DuoNeb's. --Continue steroids.   CARDIOVASCULAR A: History of CHF. Hypertension. Septic shock, initial lactic acid equals 4.9. P:  continue home hypertensive medications when appropriate. IV fluid resuscitation for sepsis protocol.  RENAL A:  Acute kidney injury with hyponatremia and hyperkalemia. P:   Continue IV fluid resuscitation.   GASTROINTESTINAL A:  GI prophylaxis P:     HEMATOLOGIC A:  Leukocytosis secondary to sepsis/pneumonia. P:  -Continue current management.  INFECTIOUS A:  Pneumonia, influenza and or bacterial superinfection. P:    Micro/culture results:  BCx2 pending UC pending Sputum pending  Antibiotics: 1/15. Zosyn>> 1/15. Vancomycin>> 1./15. Tamiflu>> 1/20.  ENDOCRINE A:   P:   Will monitor blood sugars.  NEUROLOGIC A:  -- -Fentanyl when necessary sedation.   MAJOR EVENTS/TEST RESULTS:   Best Practices  DVT Prophylaxis: Enoxaparin. GI Prophylaxis: Famotidine.   ---------------------------------------  ---------------------------------------   Name: KALVIN BUSS MRN: [id number] DOB: 03-31-1951    ADMISSION DATE:  05/17/2016    Subjective:  Continue to be on vent support.   REVIEW OF SYSTEMS:   Could not be obtained due to critical illness.   VITAL SIGNS: Temp:  [99.3 F (37.4 C)-101.5 F (38.6 C)] 100.2 F (37.9 C) (01/16 1500) Pulse Rate:  [77-106] 86 (01/16 1500) Resp:  [18-26] 20 (01/16 1500) BP: (71-122)/(39-70) 107/59 (01/16 1500) SpO2:  [92 %-96 %] 96 % (01/16 1510) FiO2 (%):  [60 %-70 %] 60 % (01/16  1510) Weight:  [289 lb 3.9 oz (131.2 kg)] 289 lb 3.9 oz (131.2 kg) (01/16 0500) HEMODYNAMICS:   VENTILATOR SETTINGS: Vent Mode: PRVC FiO2 (%):  [60 %-70 %] 60 % Set Rate:  [20 bmp-26 bmp] 26 bmp Vt Set:  [600 mL] 600 mL PEEP:  [10 cmH20] 10 cmH20 INTAKE / OUTPUT:  Intake/Output Summary (Last 24 hours) at 05/18/16 1748 Last data filed at 05/18/16 1558  Gross per 24 hour  Intake           596.67 ml  Output             1190 ml  Net          -593.33 ml    Physical Examination:   VS: BP (!) 107/59   Pulse 86   Temp 100.2 F (37.9 C)   Resp 20   Ht 5\' 8"  (1.727 m)   Wt 289 lb 3.9 oz (131.2 kg)   SpO2 96%   BMI 43.98 kg/m   General Appearance: No distress  Neuro:without focal findings, mental status reduced.  HEENT: PERRLA, EOM intact, no ptosis, no other lesions noticed;  Pulmonary: Decreased breath sounds bilaterally, bilateral rhonchi heard. CardiovascularNormal S1,S2.  No m/r/g.    Abdomen: Benign, Soft, non-tender, No masses, hepatosplenomegaly, obese. Renal:  No costovertebral tenderness  GU:  Not performed at this time. Endoc: No evident thyromegaly, no signs of acromegaly. Skin:   warm, no rashes, no ecchymosis  Extremities: normal, no cyanosis, clubbing, no edema, warm with normal capillary refill.    LABS: Reviewed   LABORATORY PANEL:   CBC  Recent Labs Lab 05/18/16 0653  WBC 26.9*  HGB 14.5  HCT 42.2  PLT 164    Chemistries   Recent Labs Lab 05/17/16 1215  05/18/16 0653  NA 129*  < > 137  K 5.4*  < > 4.5  CL 90*  < > 102  CO2 26  < > 27  GLUCOSE 170*  < > 88  BUN 23*  < > 29*  CREATININE 1.38*  < > 1.28*  CALCIUM 8.7*  < > 8.2*  MG  --   --  2.0  AST 83*  --   --   ALT 28  --   --   ALKPHOS 74  --   --   BILITOT 0.8  --   --   < > = values in this interval not displayed.   Recent Labs Lab 05/17/16 1604  GLUCAP 132*    Recent Labs Lab 05/17/16 1226 05/18/16 0500 05/18/16 0936  PHART 7.19* 7.21* 7.26*  PCO2ART 61* 72*  56*  PO2ART 308* 89 115*    Recent Labs Lab 05/17/16 1215  AST 83*  ALT 28  ALKPHOS 74  BILITOT 0.8  ALBUMIN 3.9    Cardiac Enzymes  Recent Labs Lab 05/17/16 1802  TROPONINI 0.30*    RADIOLOGY:  Dg Abdomen 1 View  Result Date: 05/17/2016 CLINICAL DATA:  OG tube placement EXAM: ABDOMEN - 1 VIEW COMPARISON:  CT abdomen 11/07/2015 FINDINGS: Nasogastric tube with the tip projecting of the stomach, but recommend advancing the tube 8 cm. There is no bowel dilatation to suggest obstruction. There is no evidence of pneumoperitoneum, portal venous gas or pneumatosis. There are no pathologic calcifications along the expected course of the ureters. The osseous structures are unremarkable. IMPRESSION: Nasogastric tube with the tip projecting of the stomach, but recommend advancing the tube 8 cm. Electronically Signed   By: Elige KoHetal  Patel   On: 05/17/2016 13:23   Ct Angio Chest Pe W And/or Wo Contrast  Result Date: 05/17/2016 CLINICAL DATA:  Respiratory failure.  COPD EXAM: CT ANGIOGRAPHY CHEST WITH CONTRAST TECHNIQUE: Multidetector CT imaging of the chest was performed using the standard protocol during bolus administration of intravenous contrast. Multiplanar CT image reconstructions and MIPs were obtained to evaluate the vascular anatomy. CONTRAST:  75 cc Isovue 370 intravenous COMPARISON:  09/30/2015 FINDINGS: Cardiovascular: Satisfactory opacification of the pulmonary arteries to the segmental level. When accounting for areas of artifact no identified pulmonary embolism. Normal heart size. No pericardial effusion. Dilated pulmonary arterial tree with main pulmonary artery diameter of 39 mm, compatible pulmonary hypertension. This may be cor pulmonale. Mediastinum/Nodes: Negative for mass or adenopathy. Endotracheal and orogastric tubes are in good position. Lungs/Pleura: There is a background of centrilobular and panlobular emphysema with generalized airway thickening and mucoid impaction. Patchy  consolidative airspace opacities in all lobes, most confluent in the inferior right upper lobe. These do not have a craniocaudal gradient to suggest aspiration. Hyperinflation. No effusion or cavitation. Upper Abdomen: Bilateral renal lesions as characterized on abdominal CT July 2017. Musculoskeletal: No acute or aggressive finding. 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 Review of the MIP images confirms the above findings. IMPRESSION: 1. Multi lobar pneumonia superimposed on COPD. 2. No evidence of pulmonary embolism. 3. Pulmonary hypertension. Electronically Signed   By: Marnee SpringJonathon  Watts M.D.   On: 05/17/2016 14:23   Dg Chest Port 1 View  Result Date: 05/18/2016 CLINICAL DATA:  Shortness of breath. EXAM: PORTABLE CHEST 1 VIEW COMPARISON:  CT 05/17/2016 .  Chest x-ray 05/17/2016. FINDINGS: Endotracheal tube and NG  tube in stable position. Cardiomegaly. No pulmonary venous congestion. Multifocal bilateral infiltrates again noted. Interim progression from prior exam. IMPRESSION: 1. Lines and tubes stable position. 2. Multifocal bilateral pulmonary infiltrates. Interim progression from prior exam. 3. Stable cardiomegaly . Electronically Signed   By: Maisie Fus  Register   On: 05/18/2016 08:07   Dg Chest Portable 1 View  Result Date: 05/17/2016 CLINICAL DATA:  Unresponsive. EXAM: PORTABLE CHEST 1 VIEW COMPARISON:  CT 09/30/2015.  Chest x-ray 09/22/2015. FINDINGS: Endotracheal tube noted with its tip 5 cm above the carina. Cardiomegaly with normal pulmonary vascularity. Bibasilar atelectasis. Bibasilar pulmonary infiltrates cannot be excluded. Wedge-shaped peripheral density in the right mid lung. This could represent a focal infiltrate. Infarct cannot be excluded . Contrast-enhanced CT of the chest should be considered for further evaluation in order to exclude pulmonary embolus. No pleural effusion or pneumothorax. IMPRESSION: 1. Endotracheal tube noted in  good anatomic position. 2. Low lung volumes with bibasilar atelectasis. Bibasilar infiltrates cannot be excluded. 3. Right mid lung peripheral wedge-shaped density. This could represent a focal infiltrate. Infarct cannot be excluded. Contrast-enhanced CT of the chest should be considered to exclude pulmonary embolus. Electronically Signed   By: Maisie Fus  Register   On: 05/17/2016 12:38       --Wells Guiles, MD.  Board Certified in Internal Medicine, Pulmonary Medicine, Critical Care Medicine, and Sleep Medicine.  ICU Pager [phone] [facility] Office Number: [phone]  Santiago Glad, M.D.  Stephanie Acre, M.D.  Billy Fischer, M.D   05/18/2016, 5:48 PM   Critical Care Attestation.  I have personally obtained a history, examined the patient, evaluated laboratory and imaging results, formulated the assessment and plan and placed orders. The Patient requires high complexity decision making for assessment and support, frequent evaluation and titration of therapies, application of advanced monitoring technologies and extensive interpretation of multiple databases. The patient has critical illness that could lead imminently to failure of 1 or more organ systems and requires the highest level of physician preparedness to intervene.  Critical Care Time devoted to patient care services described in this note is 45 minutes and is exclusive of time spent in procedures.

## 2016-05-18 NOTE — Progress Notes (Signed)
Anticoagulation monitoring(Lovenox):  65yo  M ordered Lovenox 40 mg Q24h  Filed Weights   05/17/16 1215 05/17/16 1600 05/18/16 0500  Weight: 298 lb (135.2 kg) 285 lb 4.4 oz (129.4 kg) 289 lb 3.9 oz (131.2 kg)   BMI 44   Lab Results  Component Value Date   CREATININE 1.28 (H) 05/18/2016   CREATININE 1.01 05/17/2016   CREATININE 1.38 (H) 05/17/2016   Estimated Creatinine Clearance: 76.1 mL/min (by C-G formula based on SCr of 1.28 mg/dL (H)). Hemoglobin & Hematocrit     Component Value Date/Time   HGB 14.5 05/18/2016 0653   HGB 11.3 (L) 06/12/2013 0458   HCT 42.2 05/18/2016 0653   HCT 46.7 01/07/2016 0912     Per Protocol for Patient with estCrcl > 30 ml/min and BMI > 40, will transition to Lovenox 40 mg Q12h.     Luisa HartScott Fard Borunda, PharmD Clinical Pharmacist

## 2016-05-18 NOTE — Progress Notes (Signed)
Initial Nutrition Assessment  DOCUMENTATION CODES:   Morbid obesity  INTERVENTION:  Tube Feeding: discussed nutrition poc during ICU rounds and received verbal order from MD Ramachandran to begin TF at low rate; trickle TF (Vital High Protein at 20 ml/hr) ordered via Adult Tube Feeding Protocol. If tolerating, goal rate to meet nutritional needs is 70 ml/hr providing 148 g protein, 1680 kcals, 1411 mL of free water.    NUTRITION DIAGNOSIS:   Inadequate oral intake related to acute illness as evidenced by NPO status.  GOAL:   Provide needs based on ASPEN/SCCM guidelines  MONITOR:   TF tolerance, Labs, Weight trends, Vent status  REASON FOR ASSESSMENT:   Ventilator    ASSESSMENT:    66 yo male admitted with acute respiratory failure with pneumonia, flu +.  Pt currently sedated on vent  OG tube in stomach, xray recommended advancing 8 cm  Labs: reviewed Meds: solumedrol  Diet Order:  Diet NPO time specified  Skin:  Reviewed, no issues  Last BM:  05/17/16  Height:   Ht Readings from Last 1 Encounters:  05/17/16 5\' 8"  (1.727 m)    Weight:   Wt Readings from Last 1 Encounters:  05/18/16 289 lb 3.9 oz (131.2 kg)    Ideal Body Weight:  70 kg  BMI:  Body mass index is 43.98 kg/m.  Estimated Nutritional Needs:   Kcal:  1443-1836 kcals  Protein:  140-175 g  Fluid:  >/= 1.5 L  EDUCATION NEEDS:   No education needs identified at this time  Romelle StarcherCate Berneta Sconyers MS, RD, LDN [phone] Pager  [phone] Weekend/On-Call Pager

## 2016-05-18 NOTE — Progress Notes (Signed)
1000 Dr. Ardyth Manam notified of decrease in urine output.

## 2016-05-19 ENCOUNTER — Inpatient Hospital Stay: Payer: Medicare Other

## 2016-05-19 LAB — BLOOD GAS, ARTERIAL
Acid-Base Excess: 2 mmol/L (ref 0.0–2.0)
BICARBONATE: 29.5 mmol/L — AB (ref 20.0–28.0)
FIO2: 0.6
O2 Saturation: 98.9 %
PATIENT TEMPERATURE: 37
PCO2 ART: 56 mmHg — AB (ref 32.0–48.0)
PEEP: 8 cmH2O
RATE: 26 resp/min
VT: 600 mL
pH, Arterial: 7.33 — ABNORMAL LOW (ref 7.350–7.450)
pO2, Arterial: 137 mmHg — ABNORMAL HIGH (ref 83.0–108.0)

## 2016-05-19 LAB — CBC
HEMATOCRIT: 37.4 % — AB (ref 40.0–52.0)
HEMOGLOBIN: 12.6 g/dL — AB (ref 13.0–18.0)
MCH: 32 pg (ref 26.0–34.0)
MCHC: 33.6 g/dL (ref 32.0–36.0)
MCV: 95.3 fL (ref 80.0–100.0)
Platelets: 162 10*3/uL (ref 150–440)
RBC: 3.92 MIL/uL — AB (ref 4.40–5.90)
RDW: 14.3 % (ref 11.5–14.5)
WBC: 18 10*3/uL — ABNORMAL HIGH (ref 3.8–10.6)

## 2016-05-19 LAB — BASIC METABOLIC PANEL
ANION GAP: 7 (ref 5–15)
BUN: 31 mg/dL — AB (ref 6–20)
CHLORIDE: 101 mmol/L (ref 101–111)
CO2: 28 mmol/L (ref 22–32)
Calcium: 8.4 mg/dL — ABNORMAL LOW (ref 8.9–10.3)
Creatinine, Ser: 1.04 mg/dL (ref 0.61–1.24)
GFR calc Af Amer: >60 mL/min (ref >60)
GFR calc non Af Amer: >60 mL/min (ref >60)
GLUCOSE: 196 mg/dL — AB (ref 65–99)
POTASSIUM: 4.4 mmol/L (ref 3.5–5.1)
Sodium: 136 mmol/L (ref 135–145)

## 2016-05-19 LAB — GLUCOSE, CAPILLARY
GLUCOSE-CAPILLARY: 183 mg/dL — AB (ref 65–99)
GLUCOSE-CAPILLARY: 191 mg/dL — AB (ref 65–99)
GLUCOSE-CAPILLARY: 195 mg/dL — AB (ref 65–99)
Glucose-Capillary: 188 mg/dL — ABNORMAL HIGH (ref 65–99)
Glucose-Capillary: 204 mg/dL — ABNORMAL HIGH (ref 65–99)
Glucose-Capillary: 188 mg/dL — ABNORMAL HIGH (ref 65–99)

## 2016-05-19 LAB — PHOSPHORUS: Phosphorus: 1.8 mg/dL — ABNORMAL LOW (ref 2.5–4.6)

## 2016-05-19 LAB — PROCALCITONIN: Procalcitonin: 2.96 ng/mL

## 2016-05-19 LAB — MAGNESIUM: Magnesium: 2.3 mg/dL (ref 1.7–2.4)

## 2016-05-19 MED ORDER — K PHOS MONO-SOD PHOS DI & MONO 155-852-130 MG PO TABS
500.0000 mg | ORAL_TABLET | ORAL | Status: AC
  Administered 2016-05-19 – 2016-05-20 (×4): 500 mg via ORAL
  Filled 2016-05-19 (×4): qty 2

## 2016-05-19 MED ORDER — INSULIN ASPART 100 UNIT/ML ~~LOC~~ SOLN
0.0000 [IU] | SUBCUTANEOUS | Status: DC
  Administered 2016-05-19 (×2): 4 [IU] via SUBCUTANEOUS
  Administered 2016-05-19: 7 [IU] via SUBCUTANEOUS
  Administered 2016-05-19 – 2016-05-20 (×2): 4 [IU] via SUBCUTANEOUS
  Administered 2016-05-20: 3 [IU] via SUBCUTANEOUS
  Administered 2016-05-20: 4 [IU] via SUBCUTANEOUS
  Administered 2016-05-20: 2 [IU] via SUBCUTANEOUS
  Administered 2016-05-20: 4 [IU] via SUBCUTANEOUS
  Administered 2016-05-21 (×3): 3 [IU] via SUBCUTANEOUS
  Administered 2016-05-21: 4 [IU] via SUBCUTANEOUS
  Administered 2016-05-21 – 2016-05-22 (×4): 3 [IU] via SUBCUTANEOUS
  Administered 2016-05-23: 4 [IU] via SUBCUTANEOUS
  Filled 2016-05-19 (×2): qty 3
  Filled 2016-05-19: qty 2
  Filled 2016-05-19 (×7): qty 4
  Filled 2016-05-19: qty 7
  Filled 2016-05-19 (×2): qty 3
  Filled 2016-05-19: qty 4
  Filled 2016-05-19 (×4): qty 3

## 2016-05-19 MED ORDER — LORAZEPAM 2 MG/ML IJ SOLN: 1.0000 mg | Freq: Once | INTRAMUSCULAR | Status: DC

## 2016-05-19 MED ORDER — VITAL HIGH PROTEIN PO LIQD
1000.0000 mL | ORAL | Status: DC
  Administered 2016-05-19 – 2016-05-20 (×5): 1000 mL
  Administered 2016-05-20: 11:00:00

## 2016-05-19 NOTE — Progress Notes (Signed)
PULMONARY / CRITICAL CARE MEDICINE   Name: [name] MRN: [id number] DOB: 06-May-1950    ADMISSION DATE:  05/17/2016 CONSULTATION DATE: 05/17/16  REFERRING MD:  Dr. Alphonzo Lemmings  CHIEF COMPLAINT:  Respiratory distress  Patient profile: 67 YO male with Acute respiratory Failure with PNA, Influenza A Positive   REVIEW OF SYSTEMS:   Unable to obtain as the patient is intubated and on mechanical ventillation  SUBJECTIVE:  Patient remains lightly sedated on vent, follows simple command.  No acute events overnight.  VITAL SIGNS: BP 128/62   Pulse 82   Temp 99.1 F (37.3 C)   Resp (!) 26   Ht 5\' 8"  (1.727 m)   Wt 131.2 kg (289 lb 3.9 oz)   SpO2 96%   BMI 43.98 kg/m   HEMODYNAMICS:    VENTILATOR SETTINGS: Vent Mode: PRVC FiO2 (%):  [60 %-70 %] 60 % Set Rate:  [20 bmp-26 bmp] 26 bmp Vt Set:  [600 mL] 600 mL PEEP:  [8 cmH20-10 cmH20] 8 cmH20  INTAKE / OUTPUT: I/O last 3 completed shifts: In: 5166.7 [I.V.:116.7; NG/GT:100; IV Piggyback:4950] Out: 2365 [Urine:2115; Emesis/NG output:250]  PHYSICAL EXAMINATION: General:  Sickly appearing gentleman , intubated and on mechanical ventillation Neuro:, Follows command, no focal deficits HEENT:  AT, [place], No discharge, no JVD noted Cardiovascular: S1S2,Regular, no wheezes,crackles,rhonchi noted Lungs:  Diminished breath sounds bilaterally, no wheezes, crackles, rhonchi noted Abdomen:  Soft , obese, non tender, active bowel sounds Musculoskeletal:  No Inflammation/deformity noted Skin:  Warm, dry and intact  LABS:  BMET  Recent Labs Lab 05/17/16 1215 05/17/16 1802 05/18/16 0653  NA 129* 131* 137  K 5.4* 4.3 4.5  CL 90* 98* 102  CO2 26 24 27   BUN 23* 22* 29*  CREATININE 1.38* 1.01 1.28*  GLUCOSE 170* 119* 88    Electrolytes  Recent Labs Lab 05/17/16 1215 05/17/16 1802 05/18/16 0653  CALCIUM 8.7* 7.8* 8.2*  MG  --   --  2.0    CBC  Recent Labs Lab 05/17/16 1215 05/18/16 0653  WBC 30.2* 26.9*  HGB  15.7 14.5  HCT 46.6 42.2  PLT 244 164    Coag's  Recent Labs Lab 05/17/16 1215  INR 1.10    Sepsis Markers  Recent Labs Lab 05/17/16 1802 05/17/16 2106 05/17/16 2354 05/18/16 0653  LATICACIDVEN 1.2 1.0 1.0  --   PROCALCITON 2.53  --   --  5.30    ABG  Recent Labs Lab 05/17/16 1226 05/18/16 0500 05/18/16 0936  PHART 7.19* 7.21* 7.26*  PCO2ART 61* 72* 56*  PO2ART 308* 89 115*    Liver Enzymes  Recent Labs Lab 05/17/16 1215  AST 83*  ALT 28  ALKPHOS 74  BILITOT 0.8  ALBUMIN 3.9    Cardiac Enzymes  Recent Labs Lab 05/17/16 1215 05/17/16 1802  TROPONINI 0.16* 0.30*    Glucose  Recent Labs Lab 05/17/16 1604 05/18/16 1944 05/18/16 2322  GLUCAP 132* 146* 156*    Imaging Dg Chest Port 1 View  Result Date: 05/18/2016 CLINICAL DATA:  Shortness of breath. EXAM: PORTABLE CHEST 1 VIEW COMPARISON:  CT 05/17/2016 .  Chest x-ray 05/17/2016. FINDINGS: Endotracheal tube and NG tube in stable position. Cardiomegaly. No pulmonary venous congestion. Multifocal bilateral infiltrates again noted. Interim progression from prior exam. IMPRESSION: 1. Lines and tubes stable position. 2. Multifocal bilateral pulmonary infiltrates. Interim progression from prior exam. 3. Stable cardiomegaly . Electronically Signed   By: Maisie Fus  Register   On: 05/18/2016 08:07  STUDIES:  1/15 CT angio >>Multi lobar pneumonia superimposed on COPD.Marland Kitchen. No evidence of pulmonary embolism.. Pulmonary hypertension.   CULTURES:  1/15 BCx2 >>NGTD 1/15 UC>>Negative Sputum pending PCT 1/16 5.3>>2.96  ANTIBIOTICS: 1/15. Zosyn>> 1/15. Vancomycin>>1/16 1./15. Tamiflu>> 1/20  SIGNIFICANT EVENTS: 1/15>> Patient admitted to the ICU with acute respiratory failure requiring mechanical ventillation  LINES/TUBES: none   ASSESSMENT / PLAN:   PULMONARY A: Acute hypoxic and acute hypercapnic respiratory failure. Right Middle lobe PNA 1/15: Influenza A+  COPD Vent settings :  60/8/26/600 P:   Continue mechanical ventillation, wean as tolerated Continue Bronchodilators Continue steroids Continue Zosyn   CARDIOVASCULAR A: History of CHF. Hypertension. Septic shock-resolved P:  continue home antihypertensive  medications when appropriate. IV fluid resuscitation for sepsis protocol.  RENAL A:  Acute kidney injury with hyponatremia and hyperkalemia. P:   Continue IV fluid resuscitation. Replace electrolytes per usual guidelines   GASTROINTESTINAL A No active issus P Famotidine for GIP Continue tube feeding , advance as tolerated   HEMATOLOGIC A:   No active issues P: Lovenox for DVT prophylaxis Transfuse per usual guidelines  INFECTIOUS A:   Pneumonia, influenza and or bacterial superinfection. Leukocytosis secondary to sepsis/pneumonia. P:   Monitor fever curve Continue antibiotics as above Follow CBC  ENDOCRINE A:   No active issues P:   Monitor BG with BMP  NEUROLOGIC A:  No active issues P Fentanyl when necessary sedation.  Wells Guiles-Deep Tayelor Osborne, M.D.  05/19/2016   Critical Care Attestation.  I have personally obtained a history, examined the patient, evaluated laboratory and imaging results, formulated the assessment and plan and placed orders. The Patient requires high complexity decision making for assessment and support, frequent evaluation and titration of therapies, application of advanced monitoring technologies and extensive interpretation of multiple databases. The patient has critical illness that could lead imminently to failure of 1 or more organ systems and requires the highest level of physician preparedness to intervene.  Critical Care Time devoted to patient care services described in this note is 45 minutes and is exclusive of time spent in procedures.

## 2016-05-19 NOTE — Progress Notes (Signed)
Pharmacy Antibiotic Note  Christopher StanleyWillard H [name] is a 66 y.o. male with a h/o COPD and CHF admitted on 05/17/2016 with pneumonia. Patient is influenza positive. Pharmacy has been consulted for Zosyn dosing.  Plan: Continue Zosyn 3.375 g EI q 8 hours. Stop date ordered.   MRSA PCR negative--> vancomycin D/C'd  Height: 5\' 8"  (172.7 cm) Weight: 288 lb 5.8 oz (130.8 kg) IBW/kg (Calculated) : 68.4  Temp (24hrs), Avg:99.9 F (37.7 C), Min:99 F (37.2 C), Max:101.5 F (38.6 C)   Recent Labs Lab 05/17/16 1215 05/17/16 1802 05/17/16 2106 05/17/16 2354 05/18/16 0653 05/19/16 0450  WBC 30.2*  --   --   --  26.9* 18.0*  CREATININE 1.38* 1.01  --   --  1.28* 1.04  LATICACIDVEN 4.9* 1.2 1.0 1.0  --   --     Estimated Creatinine Clearance: 93.5 mL/min (by C-G formula based on SCr of 1.04 mg/dL).    Allergies  Allergen Reactions  . Aspirin     Other reaction(s): Asthma flare up  . Sulfa Antibiotics     Antimicrobials this admission: vancomycin 1/15 >> 1/16 Zosyn 1/15 >>   Dose adjustments this admission:   Microbiology results: 1/15 BCx: NGTD 1/15 UCx: pending Sputum: pending MRSA PCR: negative  Thank you for allowing pharmacy to be a part of this patient's care.  Delsa BernKelly m Fuhrmann, PharmD 05/19/2016 7:24 AM

## 2016-05-19 NOTE — Progress Notes (Signed)
Inpatient Diabetes Program Recommendations  AACE/ADA: New Consensus Statement on Inpatient Glycemic Control (2015)  Target Ranges:  Prepandial:   less than 140 mg/dL      Peak postprandial:   less than 180 mg/dL (1-2 hours)      Critically ill patients:  140 - 180 mg/dL   Results for Misty StanleyLAWRENCE, Chan H (MRN [id number]) as of 05/19/2016 11:44  Ref. Range 05/18/2016 19:44 05/18/2016 23:22 05/19/2016 05:28 05/19/2016 07:44  Glucose-Capillary Latest Ref Range: 65 - 99 mg/dL 409146 (H) 811156 (H) 914188 (H) 183 (H)   Review of Glycemic Control  Diabetes history: No Outpatient Diabetes medications: None Current orders for Inpatient glycemic control: CBGs Q4H  Inpatient Diabetes Program Recommendations: Correction (SSI): CBGs over the past 12 hours have ranged from 146-188 mg/dl, patient is ordered Solumedrol 60 mg Q8H, and will be starting tube feedings. Please consider ordering ICU Phase 1 Glycemic Control order set.   Thanks, Orlando PennerMarie Kiran Lapine, RN, MSN, CDE Diabetes Coordinator Inpatient Diabetes Program [phone] (Team Pager from 8am to 5pm)

## 2016-05-19 NOTE — Progress Notes (Signed)
Nutrition Follow-up  DOCUMENTATION CODES:   Morbid obesity  INTERVENTION:  -Discussed nutrition poc; received verbal order from MD Ramachandran to titrate TF as tolerated to goal rate of 70 ml/hr. Continue to assess -Recommend supplementing phosphorus, discussed during ICU rounds   NUTRITION DIAGNOSIS:   Inadequate oral intake related to acute illness as evidenced by NPO status  Being addressed via TF  GOAL:   Provide needs based on ASPEN/SCCM guidelines  MONITOR:   TF tolerance, Labs, Weight trends, Vent status  REASON FOR ASSESSMENT:   Ventilator    ASSESSMENT:    Pt remains on vent support  Tolerating Vital High Protein at rate of 20 ml/hr  Abdomen soft, obese.   Labs: phosphorus 1.8 Meds: NS at 75 ml/hr, solumedrol  Diet Order:  Diet NPO time specified  Skin:  Reviewed, no issues  Last BM:  05/17/16  Height:   Ht Readings from Last 1 Encounters:  05/17/16 5\' 8"  (1.727 m)    Weight:   Wt Readings from Last 1 Encounters:  05/19/16 288 lb 5.8 oz (130.8 kg)   Filed Weights   05/17/16 1600 05/18/16 0500 05/19/16 0500  Weight: 285 lb 4.4 oz (129.4 kg) 289 lb 3.9 oz (131.2 kg) 288 lb 5.8 oz (130.8 kg)    Ideal Body Weight:  70 kg  BMI:  Body mass index is 43.85 kg/m.  Estimated Nutritional Needs:   Kcal:  1443-1836 kcals  Protein:  140-175 g  Fluid:  >/= 1.5 L  EDUCATION NEEDS:   No education needs identified at this time  Romelle StarcherCate Natonya Finstad MS, RD, LDN [phone] Pager  [phone] Weekend/On-Call Pager

## 2016-05-20 ENCOUNTER — Inpatient Hospital Stay: Payer: Medicare Other

## 2016-05-20 LAB — CBC
HEMATOCRIT: 34.7 % — AB (ref 40.0–52.0)
Hemoglobin: 11.7 g/dL — ABNORMAL LOW (ref 13.0–18.0)
MCH: 32.1 pg (ref 26.0–34.0)
MCHC: 33.7 g/dL (ref 32.0–36.0)
MCV: 95.1 fL (ref 80.0–100.0)
Platelets: 148 10*3/uL — ABNORMAL LOW (ref 150–440)
RBC: 3.65 MIL/uL — AB (ref 4.40–5.90)
RDW: 14.3 % (ref 11.5–14.5)
WBC: 11.6 10*3/uL — ABNORMAL HIGH (ref 3.8–10.6)

## 2016-05-20 LAB — BASIC METABOLIC PANEL
ANION GAP: 2 — AB (ref 5–15)
Anion gap: 2 — ABNORMAL LOW (ref 5–15)
BUN: 30 mg/dL — ABNORMAL HIGH (ref 6–20)
BUN: 28 mg/dL — ABNORMAL HIGH (ref 6–20)
CHLORIDE: 106 mmol/L (ref 101–111)
CO2: 34 mmol/L — ABNORMAL HIGH (ref 22–32)
CO2: 34 mmol/L — ABNORMAL HIGH (ref 22–32)
Calcium: 9 mg/dL (ref 8.9–10.3)
Calcium: 9 mg/dL (ref 8.9–10.3)
Chloride: 108 mmol/L (ref 101–111)
Creatinine, Ser: 0.58 mg/dL — ABNORMAL LOW (ref 0.61–1.24)
Creatinine, Ser: 0.68 mg/dL (ref 0.61–1.24)
GFR calc non Af Amer: >60 mL/min (ref >60)
Glucose, Bld: 190 mg/dL — ABNORMAL HIGH (ref 65–99)
Glucose, Bld: 157 mg/dL — ABNORMAL HIGH (ref 65–99)
POTASSIUM: 3.7 mmol/L (ref 3.5–5.1)
POTASSIUM: 4.3 mmol/L (ref 3.5–5.1)
SODIUM: 142 mmol/L (ref 135–145)
SODIUM: 144 mmol/L (ref 135–145)

## 2016-05-20 LAB — BLOOD GAS, ARTERIAL
ACID-BASE EXCESS: 9.2 mmol/L — AB (ref 0.0–2.0)
Bicarbonate: 35.9 mmol/L — ABNORMAL HIGH (ref 20.0–28.0)
FIO2: 0.4
MECHVT: 550 mL
Mechanical Rate: 26
O2 SAT: 92.7 %
PCO2 ART: 58 mmHg — AB (ref 32.0–48.0)
PEEP: 8 cmH2O
PH ART: 7.4 (ref 7.350–7.450)
Patient temperature: 37
pO2, Arterial: 66 mmHg — ABNORMAL LOW (ref 83.0–108.0)

## 2016-05-20 LAB — PHOSPHORUS
PHOSPHORUS: 1.6 mg/dL — AB (ref 2.5–4.6)
Phosphorus: 1.3 mg/dL — ABNORMAL LOW (ref 2.5–4.6)

## 2016-05-20 LAB — GLUCOSE, CAPILLARY
GLUCOSE-CAPILLARY: 137 mg/dL — AB (ref 65–99)
GLUCOSE-CAPILLARY: 141 mg/dL — AB (ref 65–99)
GLUCOSE-CAPILLARY: 149 mg/dL — AB (ref 65–99)
GLUCOSE-CAPILLARY: 181 mg/dL — AB (ref 65–99)
GLUCOSE-CAPILLARY: 187 mg/dL — AB (ref 65–99)
Glucose-Capillary: 197 mg/dL — ABNORMAL HIGH (ref 65–99)

## 2016-05-20 LAB — MAGNESIUM: Magnesium: 2.4 mg/dL (ref 1.7–2.4)

## 2016-05-20 MED ORDER — POTASSIUM PHOSPHATES 15 MMOLE/5ML IV SOLN
30.0000 mmol | Freq: Once | INTRAVENOUS | Status: AC
  Administered 2016-05-20: 30 mmol via INTRAVENOUS
  Filled 2016-05-20: qty 10

## 2016-05-20 MED ORDER — METOPROLOL TARTRATE 50 MG PO TABS
50.0000 mg | ORAL_TABLET | Freq: Two times a day (BID) | ORAL | Status: DC
  Administered 2016-05-20 (×2): 50 mg via ORAL
  Filled 2016-05-20 (×2): qty 1

## 2016-05-20 MED ORDER — DIGOXIN 250 MCG PO TABS
0.2500 mg | ORAL_TABLET | Freq: Every day | ORAL | Status: DC
  Administered 2016-05-20 – 2016-05-24 (×5): 0.25 mg via ORAL
  Filled 2016-05-20 (×6): qty 1

## 2016-05-20 MED ORDER — SENNOSIDES-DOCUSATE SODIUM 8.6-50 MG PO TABS
1.0000 | ORAL_TABLET | Freq: Every day | ORAL | Status: DC
  Administered 2016-05-20 – 2016-05-22 (×3): 1 via ORAL
  Filled 2016-05-20 (×4): qty 1

## 2016-05-20 MED ORDER — POTASSIUM PHOSPHATES 15 MMOLE/5ML IV SOLN
15.0000 mmol | Freq: Once | INTRAVENOUS | Status: AC
  Administered 2016-05-20: 15 mmol via INTRAVENOUS
  Filled 2016-05-20: qty 5

## 2016-05-20 NOTE — Progress Notes (Signed)
PT Cancellation Note  Patient Details Name: [name] MRN: [id number] DOB: 03-08-1951   Cancelled Treatment:    Reason Eval/Treat Not Completed: Patient not medically ready (Consult received and chart reviewed.  Per notes, extubated this AM.  Will hold PT evaluation until next date to ensure respiratory stability prior to initiation of activity.)  Tayloranne Lekas H. Manson PasseyBrown, PT, DPT, NCS 05/20/16, 1:27 PM [phone]

## 2016-05-20 NOTE — Progress Notes (Signed)
OT Cancellation Note  Patient Details Name: [name] MRN: [id number] DOB: [birth year]   Cancelled Treatment:    Reason Eval/Treat Not Completed: Patient not medically ready (Consult received and chart reviewed.  Per notes, extubated this AM.  Will hold OT evaluation until next date to ensure respiratory stability prior to initiation of activity.))  Eliezer BottomJamie L Stiller, OTR/L 05/20/2016, 2:22 PM

## 2016-05-20 NOTE — Progress Notes (Signed)
Pharmacy Note  [name] is a 66 y.o. male with a h/o COPD and CHF admitted on 05/17/2016 with pneumonia. Patient is influenza positive. Pharmacy has been consulted for  Zosyn dosing as well as electrolyte monitoring.   Plan: 1. Continue Zosyn 3.375 g EI q 8 hours.   2. Potassium phosphate 30 mmol iv once and will recheck at 1800.   Height: 5\' 8"  (172.7 cm) Weight: 289 lb 0.4 oz (131.1 kg) IBW/kg (Calculated) : 68.4  Temp (24hrs), Avg:98.9 F (37.2 C), Min:98.6 F (37 C), Max:99.3 F (37.4 C)   Recent Labs Lab 05/17/16 1215 05/17/16 1802 05/17/16 2106 05/17/16 2354 05/18/16 0653 05/19/16 0450 05/20/16 0547  WBC 30.2*  --   --   --  26.9* 18.0* 11.6*  CREATININE 1.38* 1.01  --   --  1.28* 1.04 0.58*  LATICACIDVEN 4.9* 1.2 1.0 1.0  --   --   --     Estimated Creatinine Clearance: 121.7 mL/min (by C-G formula based on SCr of 0.58 mg/dL (L)).    Allergies  Allergen Reactions  . Aspirin     Other reaction(s): Asthma flare up  . Sulfa Antibiotics    CMP Latest Ref Rng & Units 05/20/2016 05/19/2016 05/18/2016  Glucose 65 - 99 mg/dL 161(W190(H) 960(A196(H) 88  BUN 6 - 20 mg/dL 54(U30(H) 98(J31(H) 19(J29(H)  Creatinine 0.61 - 1.24 mg/dL 4.78(G0.58(L) 9.561.04 2.13(Y1.28(H)  Sodium 135 - 145 mmol/L 142 136 137  Potassium 3.5 - 5.1 mmol/L 3.7 4.4 4.5  Chloride 101 - 111 mmol/L 106 101 102  CO2 22 - 32 mmol/L 34(H) 28 27  Calcium 8.9 - 10.3 mg/dL 9.0 8.6(V8.4(L) 7.8(I8.2(L)  Total Protein 6.5 - 8.1 g/dL - - -  Total Bilirubin 0.3 - 1.2 mg/dL - - -  Alkaline Phos 38 - 126 U/L - - -  AST 15 - 41 U/L - - -  ALT 17 - 63 U/L - - -     Antimicrobials this admission: vancomycin 1/15 >> 1/16 Zosyn 1/15 >>   Dose adjustments this admission:   Microbiology results: 1/15 BCx: NGTD 1/15 UCx: NG Sputum: pending MRSA PCR: negative  Thank you for allowing pharmacy to be a part of this patient's care.  [name], [name] 05/20/2016 11:48 AM

## 2016-05-20 NOTE — Progress Notes (Signed)
Patient fluctuating between afib HR 130s-140s and NSR 70s-80s. Patient reports no distress except feeling hot. No prn or scheduled medication for heart rate. Patient with oral digoxin, metoprolol, and cardizem listed in home medication list but none of them ordered currently. Paged intensivist on call and awaiting call back. Team will continue to monitor.

## 2016-05-20 NOTE — Progress Notes (Signed)
Spoke with Dr. Nicholos Johnsamachandran about patient's heart rate, MD ordered PO metoprolol 50 mg twice a day and digoxin 0.25 mg once a day (patient's home doses). Team will continue to monitor.

## 2016-05-20 NOTE — Care Management Note (Addendum)
Case Management Note  Patient Details  Name: [name] MRN: [id number] Date of Birth: 31-Dec-1950  Subjective/Objective:                  Spoke with wife. Patient is alone during the day- until 2PM M-F. She works not far from their home and can come home for lunch. Chronic O2 through and she thinks its through [employer]. He uses 2-2.5 liters at baseline.  Per wife his baseline is independent with mobility- prepared sandwiches. He doesn't use a ambulatory aide. He has a cane if needed but not a walker. She thinks she can borrow one. They have a shower stool.  His PCP is [facility] El[facility]amp[facility]. He has used home health services but doesn't remember what agency. She feels that he would agree with it again if needed. She thinks he may need SNF for rehab. PT evaluation would be helpful when patient is safe. [facility] for [facility]; she states he has not problems obtaining medications since turning 66 years old.   Action/Plan: RNCM will continue to follow.   Expected Discharge Date:                  Expected Discharge Plan:     In-House Referral:     Discharge planning Services  CM Consult  Post Acute Care Choice:  Home Health Choice offered to:  Spouse  DME Arranged:    DME Agency:     HH Arranged:    HH Agency:     Status of Service:  In process, will continue to follow  If discussed at Long Length of Stay Meetings, dates discussed:    Additional Comments:  Collie Siadngela Davey Limas, RN 05/20/2016, 11:58 AM

## 2016-05-20 NOTE — Progress Notes (Addendum)
PULMONARY / CRITICAL CARE MEDICINE   Name: [name] MRN: [id number] DOB: 1950-07-01    ADMISSION DATE:  05/17/2016 CONSULTATION DATE: 05/17/16  REFERRING MD:  Dr. Alphonzo Lemmings  CHIEF COMPLAINT:  Respiratory distress  Patient profile: 66 YO male with Acute respiratory Failure with PNA, Influenza A Positive   REVIEW OF SYSTEMS:   Unable to obtain as the patient is intubated and on mechanical ventillation  SUBJECTIVE:  Patient remains lightly sedated on vent, follows simple command.  No acute events overnight.  VITAL SIGNS: BP (!) 110/56   Pulse 75   Temp 98.6 F (37 C)   Resp 20   Ht 5\' 8"  (1.727 m)   Wt 289 lb 0.4 oz (131.1 kg)   SpO2 93%   BMI 43.95 kg/m   HEMODYNAMICS:    VENTILATOR SETTINGS: Vent Mode: PSV;CPAP FiO2 (%):  [40 %] 40 % Set Rate:  [26 bmp] 26 bmp Vt Set:  [550 mL-600 mL] 550 mL PEEP:  [5 cmH20-8 cmH20] 5 cmH20 Pressure Support:  [5 cmH20] 5 cmH20 Plateau Pressure:  [16 cmH20] 16 cmH20  INTAKE / OUTPUT: I/O last 3 completed shifts: In: 5113.8 [I.V.:3238.8; JX/BJ:4782; IV Piggyback:230] Out: 3075 [Urine:2500; Emesis/NG output:575]  PHYSICAL EXAMINATION: General:  Sickly appearing gentleman , intubated and on mechanical ventillation Neuro:, Follows command, no focal deficits HEENT:  AT, [place], No discharge, no JVD noted Cardiovascular: S1S2,Regular, no wheezes,crackles,rhonchi noted Lungs:  Diminished breath sounds bilaterally, no wheezes, crackles, rhonchi noted Abdomen:  Soft , obese, non tender, active bowel sounds Musculoskeletal:  No Inflammation/deformity noted Skin:  Warm, dry and intact  LABS:  BMET  Recent Labs Lab 05/18/16 0653 05/19/16 0450 05/20/16 0547  NA 137 136 142  K 4.5 4.4 3.7  CL 102 101 106  CO2 27 28 34*  BUN 29* 31* 30*  CREATININE 1.28* 1.04 0.58*  GLUCOSE 88 196* 190*    Electrolytes  Recent Labs Lab 05/18/16 0653 05/19/16 0450 05/20/16 0547  CALCIUM 8.2* 8.4* 9.0  MG 2.0 2.3 2.4  PHOS  --   1.8* 1.3*    CBC  Recent Labs Lab 05/18/16 0653 05/19/16 0450 05/20/16 0547  WBC 26.9* 18.0* 11.6*  HGB 14.5 12.6* 11.7*  HCT 42.2 37.4* 34.7*  PLT 164 162 148*    Coag's  Recent Labs Lab 05/17/16 1215  INR 1.10    Sepsis Markers  Recent Labs Lab 05/17/16 1802 05/17/16 2106 05/17/16 2354 05/18/16 0653 05/19/16 0450  LATICACIDVEN 1.2 1.0 1.0  --   --   PROCALCITON 2.53  --   --  5.30 2.96    ABG  Recent Labs Lab 05/18/16 0936 05/19/16 0458 05/20/16 0432  PHART 7.26* 7.33* 7.40  PCO2ART 56* 56* 58*  PO2ART 115* 137* 66*    Liver Enzymes  Recent Labs Lab 05/17/16 1215  AST 83*  ALT 28  ALKPHOS 74  BILITOT 0.8  ALBUMIN 3.9    Cardiac Enzymes  Recent Labs Lab 05/17/16 1215 05/17/16 1802  TROPONINI 0.16* 0.30*    Glucose  Recent Labs Lab 05/19/16 1647 05/19/16 1943 05/19/16 2354 05/20/16 0359 05/20/16 0759 05/20/16 1127  GLUCAP 188* 191* 195* 187* 197* 181*    Imaging Dg Chest 1 View  Result Date: 05/20/2016 CLINICAL DATA:  Dyspnea EXAM: CHEST 1 VIEW COMPARISON:  05/19/2016 FINDINGS: COPD with emphysema. Asymmetric airspace disease on the right slightly improved. Left lower lobe airspace disease also improved. No significant effusion. Endotracheal tube in good position.  NG tube in the stomach. IMPRESSION:  COPD. Improving bilateral airspace disease, more prominent on the right than the left. Possible clearing edema or pneumonia. Electronically Signed   By: Marlan Palauharles  Clark M.D.   On: 05/20/2016 06:54     STUDIES:  1/15 CT angio >>Multi lobar pneumonia superimposed on COPD.Marland Kitchen. No evidence of pulmonary embolism.. Pulmonary hypertension.   CULTURES:  1/15 BCx2 >>NGTD 1/15 UC>>Negative Sputum pending PCT 1/16 5.3>>2.96  ANTIBIOTICS: 1/15. Zosyn>> 1/15. Vancomycin>>1/16 1./15. Tamiflu>> 1/20  SIGNIFICANT EVENTS: 1/15>> Patient admitted to the ICU with acute respiratory failure requiring mechanical  ventillation  LINES/TUBES: none   ASSESSMENT / PLAN:   PULMONARY A: Acute hypoxic and acute hypercapnic respiratory failure. Right Middle lobe PNA 1/15: Influenza A+  COPD Vent settings : 60/8/26/600 Obesity  P:   Continue mechanical ventillation, wean as tolerated Continue Bronchodilators Continue steroids Continue Zosyn, can stop after short 5 day course.  Continue tamiflu until course completed.  --Weaning trial today and extubate if tolerated.   CARDIOVASCULAR A: History of CHF. Hypertension. Septic shock-resolved P:  continue home antihypertensive  medications when appropriate. IV fluid resuscitation for sepsis protocol.  RENAL A:  Acute kidney injury with hyponatremia and hyperkalemia. P:   Continue IV fluid resuscitation. Replace electrolytes per usual guidelines   GASTROINTESTINAL A No active issus P Famotidine for GIP Continue tube feeding , advance as tolerated   HEMATOLOGIC A:   No active issues P: Lovenox for DVT prophylaxis Transfuse per usual guidelines  INFECTIOUS A:   Pneumonia, influenza and or bacterial superinfection. Leukocytosis secondary to sepsis/pneumonia. P:   Monitor fever curve Continue antibiotics as above Follow CBC  ENDOCRINE A:   No active issues P:   Monitor BG with BMP  NEUROLOGIC A:  No active issues P Fentanyl when necessary sedation.  Wells Guiles-Deep Karlea Mckibbin, M.D.  05/20/2016   Critical Care Attestation.  I have personally obtained a history, examined the patient, evaluated laboratory and imaging results, formulated the assessment and plan and placed orders. The Patient requires high complexity decision making for assessment and support, frequent evaluation and titration of therapies, application of advanced monitoring technologies and extensive interpretation of multiple databases. The patient has critical illness that could lead imminently to failure of 1 or more organ systems and requires the  highest level of physician preparedness to intervene.  Critical Care Time devoted to patient care services described in this note is 45 minutes and is exclusive of time spent in procedures.

## 2016-05-20 NOTE — Progress Notes (Signed)
Pt extubated without complications. No stridor noted, placed on 3lpm [place], sats 95%, respiratory rate 20/min, will continue to monitor.

## 2016-05-20 NOTE — Progress Notes (Signed)
Pt. Accepted to the Hospitalist Service from Intensivist (Dr. Nicholos Johnsamachandran).    Briefly pt. Here due to respiratory failure due to Flu w/ superimposed pneumonia.  Hx of COPD and slow to wean off Vent. Extubated today.  Doing well and will transfer to floor later.

## 2016-05-20 NOTE — Progress Notes (Signed)
Brief Nutrition Follow-up:  Pt s/p extubation today. TF discontinued with extubation, NPO. Awaiting diet progression. {Phosphorus being supplemented. Will continue to assess  Romelle StarcherCate Bri Wakeman MS, RD, LDN [phone] Pager  [phone] Weekend/On-Call Pager

## 2016-05-20 NOTE — Progress Notes (Signed)
Pharmacy Note  [name] is a 66 y.o. male with a h/o COPD and CHF admitted on 05/17/2016 with pneumonia. Patient is influenza positive. Pharmacy has been consulted for  Zosyn dosing as well as electrolyte monitoring.   Plan: 1. Continue Zosyn 3.375 g EI q 8 hours.   2. Phos: 1.6   Patient already received potassium phosphate 30 mmol iv once. Will order Potassium phosphate 15mmol x 1 and recheck phos with AM labs.    Height: 5\' 8"  (172.7 cm) Weight: 289 lb 0.4 oz (131.1 kg) IBW/kg (Calculated) : 68.4  Temp (24hrs), Avg:99 F (37.2 C), Min:98.6 F (37 C), Max:99.5 F (37.5 C)   Recent Labs Lab 05/17/16 1215 05/17/16 1802 05/17/16 2106 05/17/16 2354 05/18/16 0653 05/19/16 0450 05/20/16 0547 05/20/16 1739  WBC 30.2*  --   --   --  26.9* 18.0* 11.6*  --   CREATININE 1.38* 1.01  --   --  1.28* 1.04 0.58* 0.68  LATICACIDVEN 4.9* 1.2 1.0 1.0  --   --   --   --     Estimated Creatinine Clearance: 121.7 mL/min (by C-G formula based on SCr of 0.68 mg/dL).    Allergies  Allergen Reactions  . Aspirin     Other reaction(s): Asthma flare up  . Sulfa Antibiotics    CMP Latest Ref Rng & Units 05/20/2016 05/20/2016 05/19/2016  Glucose 65 - 99 mg/dL 161(W157(H) 960(A190(H) 540(J196(H)  BUN 6 - 20 mg/dL 81(X28(H) 91(Y30(H) 78(G31(H)  Creatinine 0.61 - 1.24 mg/dL 9.560.68 2.13(Y0.58(L) 8.651.04  Sodium 135 - 145 mmol/L 144 142 136  Potassium 3.5 - 5.1 mmol/L 4.3 3.7 4.4  Chloride 101 - 111 mmol/L 108 106 101  CO2 22 - 32 mmol/L 34(H) 34(H) 28  Calcium 8.9 - 10.3 mg/dL 9.0 9.0 7.8(I8.4(L)  Total Protein 6.5 - 8.1 g/dL - - -  Total Bilirubin 0.3 - 1.2 mg/dL - - -  Alkaline Phos 38 - 126 U/L - - -  AST 15 - 41 U/L - - -  ALT 17 - 63 U/L - - -     Antimicrobials this admission: vancomycin 1/15 >> 1/16 Zosyn 1/15 >>   Dose adjustments this admission:   Microbiology results: 1/15 BCx: NGTD 1/15 UCx: NG Sputum: pending MRSA PCR: negative  Thank you for allowing pharmacy to be a part of this patient's  care.  Mardene SpeakSheema M Jameyah Fennewald 05/20/2016 6:52 PM

## 2016-05-21 LAB — CBC
HEMATOCRIT: 33.5 % — AB (ref 40.0–52.0)
Hemoglobin: 11.3 g/dL — ABNORMAL LOW (ref 13.0–18.0)
MCH: 32 pg (ref 26.0–34.0)
MCHC: 33.8 g/dL (ref 32.0–36.0)
MCV: 94.9 fL (ref 80.0–100.0)
Platelets: 154 10*3/uL (ref 150–440)
RBC: 3.54 MIL/uL — ABNORMAL LOW (ref 4.40–5.90)
RDW: 14.3 % (ref 11.5–14.5)
WBC: 12.8 10*3/uL — ABNORMAL HIGH (ref 3.8–10.6)

## 2016-05-21 LAB — BASIC METABOLIC PANEL
Anion gap: 3 — ABNORMAL LOW (ref 5–15)
BUN: 28 mg/dL — ABNORMAL HIGH (ref 6–20)
CHLORIDE: 108 mmol/L (ref 101–111)
CO2: 35 mmol/L — ABNORMAL HIGH (ref 22–32)
CREATININE: 0.63 mg/dL (ref 0.61–1.24)
Calcium: 8.9 mg/dL (ref 8.9–10.3)
Glucose, Bld: 138 mg/dL — ABNORMAL HIGH (ref 65–99)
POTASSIUM: 4.5 mmol/L (ref 3.5–5.1)
SODIUM: 146 mmol/L — AB (ref 135–145)

## 2016-05-21 LAB — GLUCOSE, CAPILLARY
GLUCOSE-CAPILLARY: 143 mg/dL — AB (ref 65–99)
GLUCOSE-CAPILLARY: 130 mg/dL — AB (ref 65–99)
GLUCOSE-CAPILLARY: 153 mg/dL — AB (ref 65–99)
GLUCOSE-CAPILLARY: 122 mg/dL — AB (ref 65–99)
Glucose-Capillary: 107 mg/dL — ABNORMAL HIGH (ref 65–99)
Glucose-Capillary: 99 mg/dL (ref 65–99)

## 2016-05-21 LAB — MAGNESIUM: MAGNESIUM: 2.4 mg/dL (ref 1.7–2.4)

## 2016-05-21 LAB — PHOSPHORUS: Phosphorus: 2.1 mg/dL — ABNORMAL LOW (ref 2.5–4.6)

## 2016-05-21 MED ORDER — K PHOS MONO-SOD PHOS DI & MONO 155-852-130 MG PO TABS
500.0000 mg | ORAL_TABLET | ORAL | Status: AC
  Administered 2016-05-21 (×4): 500 mg via ORAL
  Filled 2016-05-21 (×4): qty 2

## 2016-05-21 MED ORDER — PROMETHAZINE HCL 25 MG/ML IJ SOLN
6.2500 mg | Freq: Once | INTRAMUSCULAR | Status: AC
  Administered 2016-05-21: 6.25 mg via INTRAVENOUS
  Filled 2016-05-21: qty 1

## 2016-05-21 MED ORDER — ORAL CARE MOUTH RINSE
15.0000 mL | Freq: Two times a day (BID) | OROMUCOSAL | Status: DC
  Administered 2016-05-21 – 2016-05-24 (×6): 15 mL via OROMUCOSAL

## 2016-05-21 MED ORDER — IPRATROPIUM-ALBUTEROL 0.5-2.5 (3) MG/3ML IN SOLN
3.0000 mL | Freq: Four times a day (QID) | RESPIRATORY_TRACT | Status: DC
  Administered 2016-05-21 – 2016-05-22 (×5): 3 mL via RESPIRATORY_TRACT
  Filled 2016-05-21 (×4): qty 3

## 2016-05-21 MED ORDER — METOPROLOL TARTRATE 50 MG PO TABS
50.0000 mg | ORAL_TABLET | Freq: Every morning | ORAL | Status: DC
  Administered 2016-05-22 – 2016-05-24 (×3): 50 mg via ORAL
  Filled 2016-05-21 (×3): qty 1

## 2016-05-21 MED ORDER — GUAIFENESIN-CODEINE 100-10 MG/5ML PO SOLN
10.0000 mL | ORAL | Status: DC | PRN
  Administered 2016-05-21 – 2016-05-23 (×2): 10 mL via ORAL
  Filled 2016-05-21 (×2): qty 10

## 2016-05-21 MED ORDER — METHYLPREDNISOLONE SODIUM SUCC 125 MG IJ SOLR
60.0000 mg | Freq: Two times a day (BID) | INTRAMUSCULAR | Status: DC
  Administered 2016-05-21: 60 mg via INTRAVENOUS
  Filled 2016-05-21: qty 2

## 2016-05-21 MED ORDER — LEVOFLOXACIN 500 MG PO TABS
500.0000 mg | ORAL_TABLET | Freq: Every day | ORAL | Status: AC
  Administered 2016-05-22 – 2016-05-23 (×2): 500 mg via ORAL
  Filled 2016-05-21 (×2): qty 1

## 2016-05-21 MED FILL — Medication: Qty: 2 | Status: AC

## 2016-05-21 NOTE — Progress Notes (Signed)
PULMONARY / CRITICAL CARE MEDICINE   Name: [name] MRN: [id number] DOB: 1951-01-25    ADMISSION DATE:  05/17/2016 CONSULTATION DATE: 05/17/16  REFERRING MD:  Dr. Alphonzo LemmingsMcShane  CHIEF COMPLAINT:  Respiratory distress  Patient profile: 66 yo male with acute respiratory failure with PNA, Influenza A Positive   REVIEW OF SYSTEMS:  Positives in BOLD Gen: Denies fever, chills, weight change, fatigue, night sweats HEENT: Denies blurred vision, double vision, hearing loss, tinnitus, sinus congestion, rhinorrhea, sore throat, neck stiffness, dysphagia PULM: shortness of breath, cough, sputum production, hemoptysis, wheezing CV: Denies chest pain, edema, orthopnea, paroxysmal nocturnal dyspnea, palpitations GI: Denies abdominal pain, nausea, vomiting, diarrhea, hematochezia, melena, constipation, change in bowel habits GU: Denies dysuria, hematuria, polyuria, oliguria, urethral discharge Endocrine: Denies hot or cold intolerance, polyuria, polyphagia or appetite change Derm: Denies rash, dry skin, scaling or peeling skin change Heme: Denies easy bruising, bleeding, bleeding gums Neuro: Denies headache, numbness, weakness, slurred speech, loss of memory or consciousness  SUBJECTIVE:  Pt states he feels better he is mildly short of breath no other complaints at this time.  VITAL SIGNS: BP (!) 132/58   Pulse 71   Temp 99.3 F (37.4 C) (Core (Comment)) Comment (Src): foley  Resp (!) 21   Ht 5\' 8"  (1.727 m)   Wt 131.1 kg (289 lb 0.4 oz)   SpO2 92%   BMI 43.95 kg/m   HEMODYNAMICS:    VENTILATOR SETTINGS: Vent Mode: PSV;CPAP FiO2 (%):  [36 %-40 %] 36 % PEEP:  [5 cmH20] 5 cmH20 Pressure Support:  [5 cmH20] 5 cmH20  INTAKE / OUTPUT: I/O last 3 completed shifts: In: 5140 [P.O.:440; I.V.:2400; NG/GT:1310; IV Piggyback:990] Out: 2760 [Urine:2760]  PHYSICAL EXAMINATION: General:  well developed, well nourished obese Caucasian male  Neuro: alert and oriented, follows commands,  PERRLA HEENT: supple, no JVD Cardiovascular: NSR, s1s2, no M/R/G Lungs:  Faint expiratory wheezes bilateral bases, diminished all other lobes, even, non labored  Abdomen:  Soft , obese, non tender, active bowel sounds Musculoskeletal:  No Inflammation/deformity noted Skin:  Warm, dry and intact  LABS: Reviewed   Imaging No results found.   STUDIES:  1/15 CT angio >>Multi lobar pneumonia superimposed on COPD.Marland Kitchen. No evidence of pulmonary embolism.. Pulmonary hypertension.  CULTURES: 1/15 BCx2 >>negative  1/15 UC>>negative  ANTIBIOTICS: 1/15 Zosyn>> 1/15. Vancomycin>>1/16 1/15 Tamiflu>> 1/20  SIGNIFICANT EVENTS: 1/15>> Patient admitted to the ICU with acute respiratory failure requiring mechanical ventillation  LINES/TUBES: PIV's x2>>   ASSESSMENT / PLAN:   PULMONARY A: Acute hypoxic and acute hypercapnic respiratory failure Right Middle lobe PNA 1/15: Influenza A+ COPD P:   Supplemental O2 to maintain O2 sats 88% to 92% Continue Bronchodilators Continue iv steroids for today will transition to prednisone taper once wheezing improved  Continue Zosyn stop date 01/19 Continue tamiflu until course completed.   CARDIOVASCULAR A:  Septic shock-resolved Hx: CHF and HTN  P:  Continuous telemetry monitoring Continue outpatient antihypertensives   RENAL A:   Acute kidney injury with hyponatremia and hyperkalemia-resolved P:   Trend BMP's Replace electrolytes as indicated Monitor UOP  GASTROINTESTINAL A No active issus P Famotidine for GIP Continue 2 gram Na diet   HEMATOLOGIC A:   No active issues P: Lovenox for DVT prophylaxis Trend CBC Monitor for s/sx of bleeding Transfuse for hgb <7  INFECTIOUS A:   Pneumonia, influenza and or bacterial superinfection Leukocytosis secondary to pneumonia P:   Trend WBC's and monitor fever curve Continue abx as listed above  ENDOCRINE A:  Hyperglycemia  P:   Continue SSI CBG's  ac/hs  NEUROLOGIC A:  No active issues Hx: Anxiety P Avoid sedating medications  Prn Tylenol for pain management   Sonda Rumble, AGNP  Pulmonary/Critical Care Pager [phone] (please enter 7 digits) PCCM Consult Pager [phone] (please enter 7 digits)

## 2016-05-21 NOTE — Plan of Care (Signed)
Problem: Safety: Goal: Ability to remain free from injury will improve Outcome[facility], and up to chair with PT. Patient unsteady on feet and required assistance to both and would drop O2 saturation to mid 80s after these exertions. See previous note about heart rate. Patient did also have one incident just before 18:00 where he took off his cardiac monitoring leads and nasal cannula oxygen. Patient stated that he "[did] not know why [he] did that" and was very cooperative with RN to replace these. Patient's O2 saturation had dropped to upper 80s when RN arrived in room to replace equipment.  Problem: Fluid Volume: Goal: Ability to maintain a balanced intake and output will improve Outcome: Progressing Foley catheter removed today, patient voided once in afternoon. No complaints of discomfort from patient.

## 2016-05-21 NOTE — Evaluation (Signed)
Physical Therapy Evaluation Patient Details Name: [name] H Ausley MRN: [id number] DOB: 04/27/1951 Today's Date: 05/21/2016   History of Present Illness  presented to ER secondary to unresponsive episode with noted respiratory distress; admitted with sepsis and acute hypoxic/hypercapnic respiratory failure secondary to influenza and PNA.  Intubated 1/15-1/18, currently on supplemental O2 via [place] at 4L.  Clinical Impression  Upon evaluation, patient alert and oriented; follows simple commands and demonstrates good effort with all therapeutic tasks.  Bilat UE/LEs globally weak and deconditioned due to acute illness; generally edematous throughout extremities.  Currently requiring min assist for bed mobility; min assist +2 with RW for sit/stand and short-distance gait (4 steps).  Significant fatigue/SOB with minimal exertion (desat to 84% on 4L with simple transfer), requiring extended rest periods with all functional activities.  Unable to tolerate additional gait/mobility as result. Would benefit from skilled PT to address above deficits and promote optimal return to PLOF; recommend transition to STR upon discharge from acute hospitalization.      Follow Up Recommendations SNF    Equipment Recommendations  Rolling walker with 5" wheels    Recommendations for Other Services       Precautions / Restrictions Precautions Precautions: Fall Precaution Comments: Droplet iso Restrictions Weight Bearing Restrictions: No      Mobility  Bed Mobility Overal bed mobility: Needs Assistance Bed Mobility: Supine to Sit     Supine to sit: Min assist     General bed mobility comments: for truncal elevation  Transfers Overall transfer level: Needs assistance Equipment used: Rolling walker (2 wheeled) Transfers: Sit to/from Stand Sit to Stand: Min assist;+2 safety/equipment         General transfer comment: bilat UEs on RW for standing balance and lift  assist  Ambulation/Gait Ambulation/Gait assistance: Min assist;+2 safety/equipment Ambulation Distance (Feet): 4 Feet Assistive device: Rolling walker (2 wheeled)       General Gait Details: broad BOS, mild forward trunk flexion; significant SOB with very minimal activity.  Unable to tolerate additional activity as result (desat to 83% on 4L, requiring 2-3 min seated rest period for recovery to >90%)  Stairs            Wheelchair Mobility    Modified Rankin (Stroke Patients Only)       Balance Overall balance assessment: Needs assistance Sitting-balance support: No upper extremity supported;Feet supported Sitting balance-Leahy Scale: Good     Standing balance support: Bilateral upper extremity supported Standing balance-Leahy Scale: Fair                               Pertinent Vitals/Pain Pain Assessment: No/denies pain    Home Living Family/patient expects to be discharged to:: Private residence Living Arrangements: Spouse/significant other Available Help at Discharge: Family Type of Home: House Home Access: Stairs to enter Entrance Stairs-Rails: Right Entrance Stairs-Number of Steps: 5 Home Layout: One level Home Equipment: Cane - single point      Prior Function Level of Independence: Independent         Comments: Indep with ADLs, household and community mobility without assist device; home O2 at 3L     Hand Dominance        Extremity/Trunk Assessment   Upper Extremity Assessment Upper Extremity Assessment: Overall WFL for tasks assessed    Lower Extremity Assessment Lower Extremity Assessment: Overall WFL for tasks assessed (grossly at least 3+/5 throughout all extremities; generalized edema throughout LEs)  Communication   Communication: No difficulties (voice slightly hoarse)  Cognition Arousal/Alertness: Awake/alert Behavior During Therapy: WFL for tasks assessed/performed Overall Cognitive Status: Within  Functional Limits for tasks assessed                      General Comments      Exercises Other Exercises Other Exercises: Supine LE therexk 1x10, act assist ROM for muscular strength/endurance with functional activities: ankle pumps, heel slides, hip abduct/adduct, SLR.  Fatigues quickly, requiring intermittent rest periods after each set of therex   Assessment/Plan    PT Assessment Patient needs continued PT services  PT Problem List Decreased strength;Decreased range of motion;Decreased activity tolerance;Decreased balance;Decreased mobility;Decreased knowledge of use of DME;Decreased safety awareness;Decreased knowledge of precautions;Cardiopulmonary status limiting activity;Obesity          PT Treatment Interventions DME instruction;Gait training;Stair training;Functional mobility training;Therapeutic activities;Therapeutic exercise;Balance training;Patient/family education    PT Goals (Current goals can be found in the Care Plan section)  Acute Rehab PT Goals Patient Stated Goal: to try to make it to the chair PT Goal Formulation: With patient Time For Goal Achievement: 06/04/16 Potential to Achieve Goals: Good    Frequency Min 2X/week   Barriers to discharge Decreased caregiver support      Co-evaluation               End of Session Equipment Utilized During Treatment: Gait belt;Oxygen Activity Tolerance: Patient limited by fatigue Patient left: in chair;with call bell/phone within reach Nurse Communication: Mobility status         Time: 1610-9604 PT Time Calculation (min) (ACUTE ONLY): 29 min   Charges:   PT Evaluation $PT Eval Moderate Complexity: 1 Procedure PT Treatments $Therapeutic Exercise: 8-22 mins   PT G Codes:        Shakeya Kerkman H. Manson Passey, PT, DPT, NCS 05/21/16, 3:04 PM [phone]

## 2016-05-21 NOTE — Progress Notes (Signed)
OT Cancellation Note  Patient Details Name: [name] MRN: [id number] DOB: 1950-12-14   Cancelled Treatment:    Reason Eval/Treat Not Completed: Other (comment) Order received and chart reviewed.  Pt up on BSC with elevated HR trying to have BM.  Will attempt again later today.  Susanne BordersSusan Cassaundra Rasch, OTR/L ascom [phone] 05/21/16, 1:30 PM

## 2016-05-21 NOTE — Progress Notes (Signed)
[employer] [facility] [place] [facility]   PATIENT NAME: [name]    MR#:  [id number]  DATE OF BIRTH:  31-Aug-1950  SUBJECTIVE:  CHIEF COMPLAINT:   Chief Complaint  Patient presents with  . Respiratory Distress    Lives home, on home o2- came with SOB, have Influenza and Pneumonia. Intubated on admission, extubated 05/20/16, on nasal canula oxygen, still have some SOB. He had bradycardia with Metoprolol. REVIEW OF SYSTEMS:  CONSTITUTIONAL: No fever, fatigue or weakness.  EYES: No blurred or double vision.  EARS, NOSE, AND THROAT: No tinnitus or ear pain.  RESPIRATORY: No cough,some shortness of breath, no wheezing or hemoptysis.  CARDIOVASCULAR: No chest pain, orthopnea, edema.  GASTROINTESTINAL: No nausea, vomiting, diarrhea or abdominal pain.  GENITOURINARY: No dysuria, hematuria.  ENDOCRINE: No polyuria, nocturia,  HEMATOLOGY: No anemia, easy bruising or bleeding SKIN: No rash or lesion. MUSCULOSKELETAL: No joint pain or arthritis.   NEUROLOGIC: No tingling, numbness, weakness.  PSYCHIATRY: No anxiety or depression.   ROS  DRUG ALLERGIES:   Allergies  Allergen Reactions  . Promethazine     Caused pt to become disoriented after receiving 6.25mg  IV.  Marland Kitchen. Aspirin     Other reaction(s): Asthma flare up  . Sulfa Antibiotics     VITALS:  Blood pressure 118/61, pulse 63, temperature 99.3 F (37.4 C), temperature source Core (Comment), resp. rate (!) 21, height 5\' 8"  (1.727 m), weight 131.1 kg (289 lb 0.4 oz), SpO2 92 %.  PHYSICAL EXAMINATION:  GENERAL:  66 y.o.-year-old patient lying in the bed with no acute distress.  EYES: Pupils equal, round, reactive to light and accommodation. No scleral icterus. Extraocular muscles intact.  HEENT: Head atraumatic, normocephalic. Oropharynx and nasopharynx clear.  NECK:  Supple, no jugular venous distention. No thyroid enlargement, no tenderness.  LUNGS: Normal breath sounds bilaterally, no wheezing, some  crepitation. positive use of accessory muscles of respiration.   On supplemental oxygen. CARDIOVASCULAR: S1, S2 normal. No murmurs, rubs, or gallops.  ABDOMEN: Soft, nontender, nondistended. Bowel sounds present. No organomegaly or mass.  EXTREMITIES: No pedal edema, cyanosis, or clubbing.  NEUROLOGIC: Cranial nerves II through XII are intact. Muscle strength 5/5 in all extremities. Sensation intact. Gait not checked.  PSYCHIATRIC: The patient is alert and oriented x 3.  SKIN: No obvious rash, lesion, or ulcer.   Physical Exam LABORATORY PANEL:   CBC  Recent Labs Lab 05/21/16 0611  WBC 12.8*  HGB 11.3*  HCT 33.5*  PLT 154   ------------------------------------------------------------------------------------------------------------------  Chemistries   Recent Labs Lab 05/17/16 1215  05/21/16 0611  NA 129*  < > 146*  K 5.4*  < > 4.5  CL 90*  < > 108  CO2 26  < > 35*  GLUCOSE 170*  < > 138*  BUN 23*  < > 28*  CREATININE 1.38*  < > 0.63  CALCIUM 8.7*  < > 8.9  MG  --   < > 2.4  AST 83*  --   --   ALT 28  --   --   ALKPHOS 74  --   --   BILITOT 0.8  --   --   < > = values in this interval not displayed. ------------------------------------------------------------------------------------------------------------------  Cardiac Enzymes  Recent Labs Lab 05/17/16 1215 05/17/16 1802  TROPONINI 0.16* 0.30*   ------------------------------------------------------------------------------------------------------------------  RADIOLOGY:  Dg Chest 1 View  Result Date: 05/20/2016 CLINICAL DATA:  Dyspnea EXAM: CHEST 1 VIEW COMPARISON:  05/19/2016 FINDINGS: COPD with emphysema. Asymmetric airspace  disease on the right slightly improved. Left lower lobe airspace disease also improved. No significant effusion. Endotracheal tube in good position.  NG tube in the stomach. IMPRESSION: COPD. Improving bilateral airspace disease, more prominent on the right than the left. Possible  clearing edema or pneumonia. Electronically Signed   By: Marlan Palau M.D.   On: 05/20/2016 06:54    ASSESSMENT AND PLAN:   Active Problems:   Influenzal pneumonia   * Ac on ch respi failure   Influenza A   Pneumonia   Septic shock on presentation- resolved.   COPD exacerbation.    Supplementation oxygen, Off Ventilator , exctubated 05/20/16.   IV steroids, nebs   Tamiflu.   Zosyn for PNA- for 5 days.  * Hx of A fib and Diastolic CHF   No exacerbation   On digoxin, hold metoprolol due to bradycardia.  * Ac Kidney injury on presentation   With hyperkalemia   Resolved now.  * hypertension   Stable now, monitor.   All the records are reviewed and case discussed with Care Management/Social Workerr. Management plans discussed with the patient, family and they are in agreement.  CODE STATUS: Full  TOTAL TIME TAKING CARE OF THIS PATIENT: 35 minutes.   Transfer to floor, Start diet and PT eval.  POSSIBLE D/C IN 1-2 DAYS, DEPENDING ON CLINICAL CONDITION.   Altamese Dilling M.D on 05/21/2016   Between 7am to 6pm - Pager - [phone]  After 6pm go to [email or web address] - password [email or web address]  [facility]  Office  [phone]  CC: Primary care physician; Dortha Kern, PA  Note: This dictation was prepared with Dragon dictation along with smaller phrase technology. Any transcriptional errors that result from this process are unintentional.

## 2016-05-21 NOTE — Progress Notes (Signed)
Pharmacy Note  [name] is a 66 y.o. male with a h/o COPD and CHF admitted on 05/17/2016 with pneumonia. Patient is influenza positive. Pharmacy has been consulted for  Zosyn dosing as well as electrolyte monitoring.   Plan: 1. Continue Zosyn 3.375 g EI q 8 hours. Zosyn will end tonight and will start Levaquin 500mg  PO daily x 2 days.   2. Phos: 2.1   Ordered K Phos Neutral 500mg  PO Q4h x4 doses. Will recheck labs in the AM.   Height: 5\' 8"  (172.7 cm) Weight: 289 lb 0.4 oz (131.1 kg) IBW/kg (Calculated) : 68.4  Temp (24hrs), Avg:99.2 F (37.3 C), Min:98.6 F (37 C), Max:99.7 F (37.6 C)   Recent Labs Lab 05/17/16 1215 05/17/16 1802 05/17/16 2106 05/17/16 2354 05/18/16 0653 05/19/16 0450 05/20/16 0547 05/20/16 1739 05/21/16 0611  WBC 30.2*  --   --   --  26.9* 18.0* 11.6*  --  12.8*  CREATININE 1.38* 1.01  --   --  1.28* 1.04 0.58* 0.68 0.63  LATICACIDVEN 4.9* 1.2 1.0 1.0  --   --   --   --   --     Estimated Creatinine Clearance: 121.7 mL/min (by C-G formula based on SCr of 0.63 mg/dL).    Allergies  Allergen Reactions  . Promethazine     Caused pt to become disoriented after receiving 6.25mg  IV.  Marland Kitchen. Aspirin     Other reaction(s): Asthma flare up  . Sulfa Antibiotics    CMP Latest Ref Rng & Units 05/21/2016 05/20/2016 05/20/2016  Glucose 65 - 99 mg/dL 161(W138(H) 960(A157(H) 540(J190(H)  BUN 6 - 20 mg/dL 81(X28(H) 91(Y28(H) 78(G30(H)  Creatinine 0.61 - 1.24 mg/dL 9.560.63 2.130.68 0.86(V0.58(L)  Sodium 135 - 145 mmol/L 146(H) 144 142  Potassium 3.5 - 5.1 mmol/L 4.5 4.3 3.7  Chloride 101 - 111 mmol/L 108 108 106  CO2 22 - 32 mmol/L 35(H) 34(H) 34(H)  Calcium 8.9 - 10.3 mg/dL 8.9 9.0 9.0  Total Protein 6.5 - 8.1 g/dL - - -  Total Bilirubin 0.3 - 1.2 mg/dL - - -  Alkaline Phos 38 - 126 U/L - - -  AST 15 - 41 U/L - - -  ALT 17 - 63 U/L - - -     Antimicrobials this admission: vancomycin 1/15 >> 1/16 Zosyn 1/15 >>  Levaquin 1/20 >>  Dose adjustments this admission:   Microbiology  results: 1/15 BCx: NGTD 1/15 UCx: NG Sputum: pending MRSA PCR: negative  Thank you for allowing pharmacy to be a part of this patient's care.  Delsa BernKelly m Talbert Trembath, PharmD 05/21/2016 11:12 AM

## 2016-05-21 NOTE — Evaluation (Signed)
Occupational Therapy Evaluation Patient Details Name: [name] MRN: [id number] DOB: [birth year] Today's Date: 05/21/2016    History of Present Illness presented to ER secondary to unresponsive episode with noted respiratory distress; admitted with sepsis and acute hypoxic/hypercapnic respiratory failure secondary to influenza and PNA.  Intubated 1/15-1/18, currently on supplemental O2 via [place] at 4L.   Clinical Impression   Pt is 66 year old male who [facility] to [facility] hospital with acute respiratory distress syndrome with history of COPD.He had an unresponsive episode and admitted with sepsis and acute hypoxic/hypercapnic respiratory failure due to influenza and PNA.  He was intubated from 1/15-1/18/18 and currently on [place] of 4L with SOB and decreasing sats and increasing HR with minimal exertion.  He was able to sit up in chair for a while and then had increased HR while sitting on commode.  He is aware of breathing tech and energy conservation tech but was not able to demonstrate back due to ANS instability with sats and HR.   Pt currently requires max assist for ADLs due to SOB, decreased endurance for functional tasks and at risk for falls.  Pt would benefit from skilled OT services to increase independence in ADLs, education in energy conservation techniques, pursed lip breathing and recommendations for home modifications to increase safety and prevent falls.  Rec SNF after discharge from hospital.    Follow Up Recommendations  SNF    Equipment Recommendations  Toilet riser    Recommendations for Other Services       Precautions / Restrictions Precautions Precautions: Fall Precaution Comments: Droplet iso Restrictions Weight Bearing Restrictions: No      Mobility Bed Mobility Overal bed mobility: Needs Assistance Bed Mobility: Supine to Sit     Supine to sit: Min assist     General bed mobility comments: for truncal elevation  Transfers Overall transfer level:  Needs assistance Equipment used: Rolling walker (2 wheeled) Transfers: Sit to/from Stand Sit to Stand: Min assist;+2 safety/equipment         General transfer comment: bilat UEs on RW for standing balance and lift assist    Balance Overall balance assessment: Needs assistance Sitting-balance support: No upper extremity supported;Feet supported Sitting balance-Leahy Scale: Good     Standing balance support: Bilateral upper extremity supported Standing balance-Leahy Scale: Fair                              ADL Overall ADL's : Needs assistance/impaired                                       General ADL Comments: Pt currently very SOB with effortful breathing and desats with talking and lifting arms past shoulder height requiring a rest break.  Pt is able to feed self but requires rest breaks but is not able to complete any further ADLs at this time due to instability of HR and sats while on 4l of O2 nasal cannula with congested cough.  Discussed AD options for dressing and home modifications for at home to decrease risk of falls such as BSC over toilet or using a raised toilet seat.  He has a built in Information systems managershower seat in shower stall.       Vision     Perception     Praxis      Pertinent Vitals/Pain Pain Assessment: No/denies pain  Hand Dominance Right   Extremity/Trunk Assessment Upper Extremity Assessment Upper Extremity Assessment: Generalized weakness   Lower Extremity Assessment Lower Extremity Assessment: Defer to PT evaluation       Communication Communication Communication: No difficulties   Cognition Arousal/Alertness: Awake/alert Behavior During Therapy: WFL for tasks assessed/performed Overall Cognitive Status: Within Functional Limits for tasks assessed                     General Comments       Exercises   Other Exercises Other Exercises: Supine LE therexk 1x10, act assist ROM for muscular strength/endurance  with functional activities: ankle pumps, heel slides, hip abduct/adduct, SLR.  Fatigues quickly, requiring intermittent rest periods after each set of therex   Shoulder Instructions      Home Living Family/patient expects to be discharged to:: Private residence Living Arrangements: Spouse/significant other Available Help at Discharge: Family Type of Home: House Home Access: Stairs to enter Secretary/administrator of Steps: 5 Entrance Stairs-Rails: Right Home Layout: One level     Bathroom Shower/Tub: Walk-in shower;Door   Bathroom Toilet: Standard Bathroom Accessibility: No   Home Equipment: Cane - single point;Shower seat - built in          Prior Functioning/Environment Level of Independence: Independent        Comments: Indep with ADLs, household and community mobility without assist device; home O2 at 3L        OT Problem List: Decreased strength;Decreased range of motion;Decreased activity tolerance;Obesity;Cardiopulmonary status limiting activity   OT Treatment/Interventions: Self-care/ADL training;Patient/family education;DME and/or AE instruction;Therapeutic exercise;Energy conservation    OT Goals(Current goals can be found in the care plan section) Acute Rehab OT Goals Patient Stated Goal: "get my strength back again" OT Goal Formulation: With patient/family Time For Goal Achievement: 06/04/16 Potential to Achieve Goals: Good ADL Goals Pt Will Perform Grooming: Independently;with set-up;sitting (with stable sats and HR) Pt Will Perform Upper Body Dressing: Independently;with set-up;sitting Pt Will Perform Lower Body Dressing: with min assist;sit to/from stand (with FWW and no LOB and sats and HR stable) Pt Will Transfer to Toilet: with min assist;stand pivot transfer;regular height toilet (with no LOB, using FWW and sats and HR stable)  OT Frequency: Min 1X/week   Barriers to D/C:            Co-evaluation              End of Session Nurse  Communication: Other (comment) (NSG indicated HR and sats stable now that he is back in bed)  Activity Tolerance: Patient limited by fatigue;Other (comment) (decreased sats into low 90s and HR increased to 90-100 ) Patient left: in bed;with call bell/phone within reach;with bed alarm set;with family/visitor present   Time: 1440-1505 OT Time Calculation (min): 25 min Charges:  OT General Charges $OT Visit: 1 Procedure OT Evaluation $OT Eval Moderate Complexity: 1 Procedure OT Treatments $Self Care/Home Management : 8-22 mins G-Codes:    Susanne Borders, OTR/L ascom [phone] 05/21/16, 3:23 PM

## 2016-05-21 NOTE — Progress Notes (Signed)
Right after report given on patient so patient could transfer out of unit, patient had a 17 beat run of Vtach. Patient was resting at the time and reports no distress.  Patient had earlier today had one brief [facility] he spontaneously converted back to SR after about 30 sec-1 min. Notified MD Dr. Elisabeth PigeonVachhani and he ordered patient to remain as stepdown for at least one more night to monitor.

## 2016-05-22 LAB — BASIC METABOLIC PANEL
Anion gap: 4 — ABNORMAL LOW (ref 5–15)
BUN: 25 mg/dL — ABNORMAL HIGH (ref 6–20)
CALCIUM: 8.8 mg/dL — AB (ref 8.9–10.3)
CO2: 33 mmol/L — AB (ref 22–32)
CREATININE: 0.64 mg/dL (ref 0.61–1.24)
Chloride: 109 mmol/L (ref 101–111)
GLUCOSE: 141 mg/dL — AB (ref 65–99)
Potassium: 4.5 mmol/L (ref 3.5–5.1)
Sodium: 146 mmol/L — ABNORMAL HIGH (ref 135–145)

## 2016-05-22 LAB — CBC
HCT: 36 % — ABNORMAL LOW (ref 40.0–52.0)
Hemoglobin: 12 g/dL — ABNORMAL LOW (ref 13.0–18.0)
MCH: 32.3 pg (ref 26.0–34.0)
MCHC: 33.5 g/dL (ref 32.0–36.0)
MCV: 96.6 fL (ref 80.0–100.0)
PLATELETS: 145 10*3/uL — AB (ref 150–440)
RBC: 3.73 MIL/uL — ABNORMAL LOW (ref 4.40–5.90)
RDW: 14.3 % (ref 11.5–14.5)
WBC: 9.7 10*3/uL (ref 3.8–10.6)

## 2016-05-22 LAB — GLUCOSE, CAPILLARY
GLUCOSE-CAPILLARY: 132 mg/dL — AB (ref 65–99)
GLUCOSE-CAPILLARY: 102 mg/dL — AB (ref 65–99)
GLUCOSE-CAPILLARY: 93 mg/dL (ref 65–99)
Glucose-Capillary: 134 mg/dL — ABNORMAL HIGH (ref 65–99)
Glucose-Capillary: 128 mg/dL — ABNORMAL HIGH (ref 65–99)

## 2016-05-22 LAB — CULTURE, BLOOD (ROUTINE X 2)
CULTURE: NO GROWTH
Culture: NO GROWTH

## 2016-05-22 LAB — MAGNESIUM: MAGNESIUM: 2.3 mg/dL (ref 1.7–2.4)

## 2016-05-22 LAB — PHOSPHORUS: Phosphorus: 3.1 mg/dL (ref 2.5–4.6)

## 2016-05-22 MED ORDER — IPRATROPIUM-ALBUTEROL 0.5-2.5 (3) MG/3ML IN SOLN
3.0000 mL | RESPIRATORY_TRACT | Status: DC
  Administered 2016-05-22 – 2016-05-24 (×10): 3 mL via RESPIRATORY_TRACT
  Filled 2016-05-22 (×12): qty 3

## 2016-05-22 MED ORDER — IPRATROPIUM-ALBUTEROL 0.5-2.5 (3) MG/3ML IN SOLN: 3.0000 mL | RESPIRATORY_TRACT | Status: DC

## 2016-05-22 MED ORDER — PREDNISONE 20 MG PO TABS
40.0000 mg | ORAL_TABLET | Freq: Every day | ORAL | Status: DC
  Administered 2016-05-22 – 2016-05-24 (×3): 40 mg via ORAL
  Filled 2016-05-22 (×3): qty 2

## 2016-05-22 NOTE — Progress Notes (Signed)
[facility] [place] [facility] Progess Note    ASSESSMENT/PLAN   Respiratory failure. Acute on chronic hypercapnia. Right middle lobe pneumonia. Influenza A positive. Doing well this morning, on noninvasive ventilation. Has completed a course of Tamiflu, and yesterday was his last day of Zosyn. We'll change Solu-Medrol to prednisone, continue bronchodilators as prescribed.     Name: Christopher StanleyWillard H [name] MRN: [id number] DOB: 19-Apr-1951    ADMISSION DATE:  05/17/2016   SUBJECTIVE:   Mr. [name] is doing well this morning, he has no complaints from last evening, following on noninvasive ventilation study that his breathing is stable  Review of Systems:  Constitutional: Feels well. Cardiovascular: No chest pain.  Pulmonary: Denies dyspnea.   The remainder of systems were reviewed and were found to be negative other than what is documented in the HPI.    VITAL SIGNS: Temp:  [97.6 F (36.4 C)-99.1 F (37.3 C)] 97.8 F (36.6 C) (01/20 0200) Pulse Rate:  [48-78] 52 (01/20 0600) Resp:  [13-24] 18 (01/20 0600) BP: (97-151)/(36-89) 142/69 (01/20 0600) SpO2:  [90 %-100 %] 99 % (01/20 0600) FiO2 (%):  [36 %] 36 % (01/20 0300) Weight:  [130.9 kg (288 lb 9.3 oz)] 130.9 kg (288 lb 9.3 oz) (01/20 0429) HEMODYNAMICS:   VENTILATOR SETTINGS: FiO2 (%):  [36 %] 36 % INTAKE / OUTPUT:  Intake/Output Summary (Last 24 hours) at 05/22/16 0815 Last data filed at 05/22/16 0300  Gross per 24 hour  Intake              650 ml  Output              750 ml  Net             -100 ml    PHYSICAL EXAMINATION: Physical Examination:   VS: BP (!) 142/69   Pulse (!) 52   Temp 97.8 F (36.6 C) (Axillary)   Resp 18   Ht 5\' 8"  (1.727 m)   Wt 130.9 kg (288 lb 9.3 oz)   SpO2 99%   BMI 43.88 kg/m   General Appearance: No distress  Neuro:without focal findings, mental status normal. HEENT: PERRLA, EOM intact. Pulmonary: Diminished breath sounds bilaterally, prolonged expiratory phase, mild  wheezing noted CardiovascularNormal S1,S2.  No m/r/g.   Abdomen: Benign, Soft, non-tender. Renal:  No costovertebral tenderness  GU:  Not performed at this time. Endocrine: No evident thyromegaly. Skin:   warm, no rashes, no ecchymosis  Extremities: normal, no cyanosis, clubbing.  LABORATORY PANEL:   CBC  Recent Labs Lab 05/22/16 0430  WBC 9.7  HGB 12.0*  HCT 36.0*  PLT 145*    Chemistries   Recent Labs Lab 05/17/16 1215  05/22/16 0430  NA 129*  < > 146*  K 5.4*  < > 4.5  CL 90*  < > 109  CO2 26  < > 33*  GLUCOSE 170*  < > 141*  BUN 23*  < > 25*  CREATININE 1.38*  < > 0.64  CALCIUM 8.7*  < > 8.8*  MG  --   < > 2.3  PHOS  --   < > 3.1  AST 83*  --   --   ALT 28  --   --   ALKPHOS 74  --   --   BILITOT 0.8  --   --   < > = values in this interval not displayed.   Recent Labs Lab 05/21/16 1217 05/21/16 1549 05/21/16 1955 05/21/16 2356 05/22/16 0414 05/22/16 0732  GLUCAP 153* 122* 107* 99 134* 128*    Recent Labs Lab 05/18/16 0936 05/19/16 0458 05/20/16 0432  PHART 7.26* 7.33* 7.40  PCO2ART 56* 56* 58*  PO2ART 115* 137* 66*    Recent Labs Lab 05/17/16 1215  AST 83*  ALT 28  ALKPHOS 74  BILITOT 0.8  ALBUMIN 3.9    Cardiac Enzymes  Recent Labs Lab 05/17/16 1802  TROPONINI 0.30*    RADIOLOGY:  No results found.   Tora Kindred, DO [facility] Office Number: [phone]  Santiago Glad, M.D.  Stephanie Acre, M.D.  Billy Fischer, M.D  05/22/2016

## 2016-05-22 NOTE — Progress Notes (Addendum)
Pharmacy Note  [name] is a 66 y.o. male with a h/o COPD and CHF admitted on 05/17/2016 with pneumonia. Patient is influenza positive. Pharmacy has been consulted for  Zosyn dosing as well as electrolyte monitoring.   Plan: 1. Zosyn transitioned 1/20  to Levaquin 500mg  PO daily x 2 days.   2. Electrolytes: K 4.1, Mg 2.1, Phos 2.4. Give Neutra Phos tab 250 mg po TID x 3 doses, recheck electrolytes with AM labs. NAC    Height: 5\' 8"  (172.7 cm) Weight: 288 lb 9.3 oz (130.9 kg) IBW/kg (Calculated) : 68.4  Temp (24hrs), Avg:98 F (36.7 C), Min:97.6 F (36.4 C), Max:98.2 F (36.8 C)   Recent Labs Lab 05/17/16 1215 05/17/16 1802 05/17/16 2106 05/17/16 2354 05/18/16 0653 05/19/16 0450 05/20/16 0547 05/20/16 1739 05/21/16 0611 05/22/16 0430  WBC 30.2*  --   --   --  26.9* 18.0* 11.6*  --  12.8* 9.7  CREATININE 1.38* 1.01  --   --  1.28* 1.04 0.58* 0.68 0.63 0.64  LATICACIDVEN 4.9* 1.2 1.0 1.0  --   --   --   --   --   --     Estimated Creatinine Clearance: 121.6 mL/min (by C-G formula based on SCr of 0.64 mg/dL).    Allergies  Allergen Reactions  . Promethazine     Caused pt to become disoriented after receiving 6.25mg  IV.  Marland Kitchen. Aspirin     Other reaction(s): Asthma flare up  . Sulfa Antibiotics    CMP Latest Ref Rng & Units 05/22/2016 05/21/2016 05/20/2016  Glucose 65 - 99 mg/dL 161(W141(H) 960(A138(H) 540(J157(H)  BUN 6 - 20 mg/dL 81(X25(H) 91(Y28(H) 78(G28(H)  Creatinine 0.61 - 1.24 mg/dL 9.560.64 2.130.63 0.860.68  Sodium 135 - 145 mmol/L 146(H) 146(H) 144  Potassium 3.5 - 5.1 mmol/L 4.5 4.5 4.3  Chloride 101 - 111 mmol/L 109 108 108  CO2 22 - 32 mmol/L 33(H) 35(H) 34(H)  Calcium 8.9 - 10.3 mg/dL 5.7(Q8.8(L) 8.9 9.0  Total Protein 6.5 - 8.1 g/dL - - -  Total Bilirubin 0.3 - 1.2 mg/dL - - -  Alkaline Phos 38 - 126 U/L - - -  AST 15 - 41 U/L - - -  ALT 17 - 63 U/L - - -     Antimicrobials this admission: Tamiflu 1/15 >>  vancomycin 1/15 >> 1/16 Zosyn 1/15 >> 1/20 Levaquin 1/20 >>  Dose  adjustments this admission:   Microbiology results: 1/15 BCx: NGTD 1/15 UCx: NG Sputum: pending MRSA PCR: negative  Thank you for allowing pharmacy to be a part of this patient's care.  Merrill,Kristin A, PharmD 05/22/2016 9:35 AM

## 2016-05-22 NOTE — Progress Notes (Signed)
Started patient on IS and flutter valve earlier on during shift. Patient currently resting comfortably in bed with bipap.

## 2016-05-22 NOTE — Progress Notes (Signed)
Protective nasal cushion placed on the bridge of pts nose before Bipap placement.

## 2016-05-23 LAB — BASIC METABOLIC PANEL
ANION GAP: 4 — AB (ref 5–15)
BUN: 21 mg/dL — AB (ref 6–20)
CHLORIDE: 105 mmol/L (ref 101–111)
CO2: 36 mmol/L — ABNORMAL HIGH (ref 22–32)
Calcium: 8.8 mg/dL — ABNORMAL LOW (ref 8.9–10.3)
Creatinine, Ser: 0.53 mg/dL — ABNORMAL LOW (ref 0.61–1.24)
GFR calc Af Amer: >60 mL/min (ref >60)
GLUCOSE: 86 mg/dL (ref 65–99)
POTASSIUM: 4.1 mmol/L (ref 3.5–5.1)
Sodium: 145 mmol/L (ref 135–145)

## 2016-05-23 LAB — GLUCOSE, CAPILLARY
GLUCOSE-CAPILLARY: 159 mg/dL — AB (ref 65–99)
GLUCOSE-CAPILLARY: 72 mg/dL (ref 65–99)
GLUCOSE-CAPILLARY: 83 mg/dL (ref 65–99)
GLUCOSE-CAPILLARY: 96 mg/dL (ref 65–99)
Glucose-Capillary: 112 mg/dL — ABNORMAL HIGH (ref 65–99)
Glucose-Capillary: 86 mg/dL (ref 65–99)

## 2016-05-23 LAB — MAGNESIUM: Magnesium: 2.1 mg/dL (ref 1.7–2.4)

## 2016-05-23 LAB — PHOSPHORUS: Phosphorus: 2.4 mg/dL — ABNORMAL LOW (ref 2.5–4.6)

## 2016-05-23 MED ORDER — K PHOS MONO-SOD PHOS DI & MONO 155-852-130 MG PO TABS
250.0000 mg | ORAL_TABLET | Freq: Three times a day (TID) | ORAL | Status: AC
  Administered 2016-05-23 (×3): 250 mg via ORAL
  Filled 2016-05-23 (×3): qty 1

## 2016-05-23 MED ORDER — MOMETASONE FURO-FORMOTEROL FUM 200-5 MCG/ACT IN AERO
2.0000 | INHALATION_SPRAY | Freq: Two times a day (BID) | RESPIRATORY_TRACT | Status: DC
  Administered 2016-05-23 – 2016-05-24 (×3): 2 via RESPIRATORY_TRACT
  Filled 2016-05-23: qty 8.8

## 2016-05-23 NOTE — Progress Notes (Signed)
[facility] - [place] at [facility]   PATIENT NAME: [name]    MR#:  [id number]  DATE OF BIRTH:  04/29/1951  SUBJECTIVE:  CHIEF COMPLAINT:   Chief Complaint  Patient presents with  . Respiratory Distress    Lives home, on home o2- came with SOB, have Influenza and Pneumonia. Intubated on admission, extubated 05/20/16, on nasal canula oxygen, still have some SOB. He had bradycardia with Metoprolol. Finished course of tamiflu. Better now, weak. REVIEW OF SYSTEMS:  CONSTITUTIONAL: No fever, fatigue or weakness.  EYES: No blurred or double vision.  EARS, NOSE, AND THROAT: No tinnitus or ear pain.  RESPIRATORY: No cough,some shortness of breath, no wheezing or hemoptysis.  CARDIOVASCULAR: No chest pain, orthopnea, edema.  GASTROINTESTINAL: No nausea, vomiting, diarrhea or abdominal pain.  GENITOURINARY: No dysuria, hematuria.  ENDOCRINE: No polyuria, nocturia,  HEMATOLOGY: No anemia, easy bruising or bleeding SKIN: No rash or lesion. MUSCULOSKELETAL: No joint pain or arthritis.   NEUROLOGIC: No tingling, numbness, weakness.  PSYCHIATRY: No anxiety or depression.   ROS  DRUG ALLERGIES:   Allergies  Allergen Reactions  . Promethazine     Caused pt to become disoriented after receiving 6.25mg  IV.  Marland Kitchen Aspirin     Other reaction(s): Asthma flare up  . Sulfa Antibiotics     VITALS:  Blood pressure 114/65, pulse (!) 52, temperature 98.2 F (36.8 C), temperature source Axillary, resp. rate 20, height 5\' 8"  (1.727 m), weight 133.9 kg (295 lb 4.8 oz), SpO2 96 %.  PHYSICAL EXAMINATION:  GENERAL:  66 y.o.-year-old patient lying in the bed with no acute distress.  EYES: Pupils equal, round, reactive to light and accommodation. No scleral icterus. Extraocular muscles intact.  HEENT: Head atraumatic, normocephalic. Oropharynx and nasopharynx clear.  NECK:  Supple, no jugular venous distention. No thyroid enlargement, no tenderness.  LUNGS: Normal breath  sounds bilaterally, no wheezing, some crepitation. positive use of accessory muscles of respiration.   On supplemental oxygen. CARDIOVASCULAR: S1, S2 normal. No murmurs, rubs, or gallops.  ABDOMEN: Soft, nontender, nondistended. Bowel sounds present. No organomegaly or mass.  EXTREMITIES: No pedal edema, cyanosis, or clubbing.  NEUROLOGIC: Cranial nerves II through XII are intact. Muscle strength 4/5 in all extremities. Sensation intact. Gait not checked.  PSYCHIATRIC: The patient is alert and oriented x 3.  SKIN: No obvious rash, lesion, or ulcer.   Physical Exam LABORATORY PANEL:   CBC  Recent Labs Lab 05/22/16 0430  WBC 9.7  HGB 12.0*  HCT 36.0*  PLT 145*   ------------------------------------------------------------------------------------------------------------------  Chemistries   Recent Labs Lab 05/17/16 1215  05/23/16 0605  NA 129*  < > 145  K 5.4*  < > 4.1  CL 90*  < > 105  CO2 26  < > 36*  GLUCOSE 170*  < > 86  BUN 23*  < > 21*  CREATININE 1.38*  < > 0.53*  CALCIUM 8.7*  < > 8.8*  MG  --   < > 2.1  AST 83*  --   --   ALT 28  --   --   ALKPHOS 74  --   --   BILITOT 0.8  --   --   < > = values in this interval not displayed. ------------------------------------------------------------------------------------------------------------------  Cardiac Enzymes  Recent Labs Lab 05/17/16 1215 05/17/16 1802  TROPONINI 0.16* 0.30*   ------------------------------------------------------------------------------------------------------------------  RADIOLOGY:  No results found.  ASSESSMENT AND PLAN:   Active Problems:   Influenzal pneumonia   * Ac  on ch respi failure   Influenza A   Pneumonia   Septic shock on presentation- resolved.   COPD exacerbation.    Supplementation oxygen, Off Ventilator , exctubated 05/20/16.   IV steroids, nebs   Tamiflu.- finished course.   Zosyn for PNA- for 5 days. Now oral levaquin.   Taper steroids to oral.  * Hx  of A fib and Diastolic CHF   No exacerbation   On digoxin, hold metoprolol due to bradycardia.  * Ac Kidney injury on presentation   With hyperkalemia   Resolved now.  * hypertension   Stable now, monitor.   All the records are reviewed and case discussed with Care Management/Social Workerr. Management plans discussed with the patient, family and they are in agreement.  CODE STATUS: Full  TOTAL TIME TAKING CARE OF THIS PATIENT: 35 minutes.   Transfer to floor, Start diet and PT eval. Need SNF on discharge.  POSSIBLE D/C IN 1-2 DAYS, DEPENDING ON CLINICAL CONDITION.   Altamese DillingVACHHANI, Carter Kassel M.D on 05/23/2016   Between 7am to 6pm - Pager - [phone]  After 6pm go to [email or web address] - password [email or web address]  [facility]  Office  [phone]  CC: Primary care physician; Dortha Kernennis Chrismon, PA  Note: This dictation was prepared with Dragon dictation along with smaller phrase technology. Any transcriptional errors that result from this process are unintentional.

## 2016-05-23 NOTE — Progress Notes (Signed)
[facility] -  at [facility]   PATIENT NAME: [name]    MR#:  [id number]  DATE OF BIRTH:  05-Feb-1951  SUBJECTIVE:  CHIEF COMPLAINT:   Chief Complaint  Patient presents with  . Respiratory Distress    Lives home, on home o2- came with SOB, have Influenza and Pneumonia. Intubated on admission, extubated 05/20/16, on nasal canula oxygen, still have some SOB. He had bradycardia with Metoprolol. Finished course of tamiflu. Better now, weak.  still have some wheeze and secretions. REVIEW OF SYSTEMS:  CONSTITUTIONAL: No fever, fatigue or weakness.  EYES: No blurred or double vision.  EARS, NOSE, AND THROAT: No tinnitus or ear pain.  RESPIRATORY: No cough,some shortness of breath, no wheezing or hemoptysis.  CARDIOVASCULAR: No chest pain, orthopnea, edema.  GASTROINTESTINAL: No nausea, vomiting, diarrhea or abdominal pain.  GENITOURINARY: No dysuria, hematuria.  ENDOCRINE: No polyuria, nocturia,  HEMATOLOGY: No anemia, easy bruising or bleeding SKIN: No rash or lesion. MUSCULOSKELETAL: No joint pain or arthritis.   NEUROLOGIC: No tingling, numbness, weakness.  PSYCHIATRY: No anxiety or depression.   ROS  DRUG ALLERGIES:   Allergies  Allergen Reactions  . Promethazine     Caused pt to become disoriented after receiving 6.25mg  IV.  Marland Kitchen Aspirin     Other reaction(s): Asthma flare up  . Sulfa Antibiotics     VITALS:  Blood pressure 132/62, pulse 68, temperature 98.1 F (36.7 C), temperature source Oral, resp. rate 20, height 5\' 8"  (1.727 m), weight 133.9 kg (295 lb 4.8 oz), SpO2 94 %.  PHYSICAL EXAMINATION:  GENERAL:  66 y.o.-year-old patient lying in the bed with no acute distress.  EYES: Pupils equal, round, reactive to light and accommodation. No scleral icterus. Extraocular muscles intact.  HEENT: Head atraumatic, normocephalic. Oropharynx and nasopharynx clear.  NECK:  Supple, no jugular venous distention. No thyroid enlargement, no  tenderness.  LUNGS: Normal breath sounds bilaterally, no wheezing, some crepitation. positive use of accessory muscles of respiration.   On supplemental oxygen. CARDIOVASCULAR: S1, S2 normal. No murmurs, rubs, or gallops.  ABDOMEN: Soft, nontender, nondistended. Bowel sounds present. No organomegaly or mass.  EXTREMITIES: No pedal edema, cyanosis, or clubbing.  NEUROLOGIC: Cranial nerves II through XII are intact. Muscle strength 4/5 in all extremities. Sensation intact. Gait not checked.  PSYCHIATRIC: The patient is alert and oriented x 3.  SKIN: No obvious rash, lesion, or ulcer.   Physical Exam LABORATORY PANEL:   CBC  Recent Labs Lab 05/22/16 0430  WBC 9.7  HGB 12.0*  HCT 36.0*  PLT 145*   ------------------------------------------------------------------------------------------------------------------  Chemistries   Recent Labs Lab 05/17/16 1215  05/23/16 0605  NA 129*  < > 145  K 5.4*  < > 4.1  CL 90*  < > 105  CO2 26  < > 36*  GLUCOSE 170*  < > 86  BUN 23*  < > 21*  CREATININE 1.38*  < > 0.53*  CALCIUM 8.7*  < > 8.8*  MG  --   < > 2.1  AST 83*  --   --   ALT 28  --   --   ALKPHOS 74  --   --   BILITOT 0.8  --   --   < > = values in this interval not displayed. ------------------------------------------------------------------------------------------------------------------  Cardiac Enzymes  Recent Labs Lab 05/17/16 1215 05/17/16 1802  TROPONINI 0.16* 0.30*   ------------------------------------------------------------------------------------------------------------------  RADIOLOGY:  No results found.  ASSESSMENT AND PLAN:   Active Problems:  Influenzal pneumonia   * Ac on ch respi failure   Influenza A   Pneumonia   Septic shock on presentation- resolved.   COPD exacerbation.    Supplementation oxygen, Off Ventilator , exctubated 05/20/16.   IV steroids, nebs   Tamiflu.- finished course.   Zosyn for PNA- for 5 days. Now oral  levaquin.   Taper steroids to oral.   Encouraged to use spirometer and flutter valve.  * Hx of A fib and Diastolic CHF   No exacerbation   On digoxin, hold metoprolol due to bradycardia.  * Ac Kidney injury on presentation   With hyperkalemia   Resolved now.  * hypertension   Stable now, monitor.  Waiting for SNF placement.   All the records are reviewed and case discussed with Care Management/Social Workerr. Management plans discussed with the patient, family and they are in agreement.  CODE STATUS: Full  TOTAL TIME TAKING CARE OF THIS PATIENT: 35 minutes.   Transfer to floor, Start diet and PT eval. Need SNF on discharge. Pt's son was present in room today.  POSSIBLE D/C IN 1-2 DAYS, DEPENDING ON CLINICAL CONDITION.   Altamese DillingVACHHANI, Shantrice Rodenberg M.D on 05/23/2016   Between 7am to 6pm - Pager - [phone]  After 6pm go to [email or web address] - password [email or web address]  [facility] [place] [facility]  Office  [phone]  CC: Primary care physician; Dortha Kernennis Chrismon, PA  Note: This dictation was prepared with Dragon dictation along with smaller phrase technology. Any transcriptional errors that result from this process are unintentional.

## 2016-05-23 NOTE — Progress Notes (Signed)
[facility] [place] Critical Care Medicine Progess Note    ASSESSMENT/PLAN   Respiratory failure. Acute on chronic hypercapnia. Right middle lobe pneumonia. Influenza A positive. Doing well this morning. Has been transferred to the floor, still complaining of some shortness of breath with wheezing and bronchospasm on exam. Is on albuterol, Atrovent, prednisone at 40 mg. Since he still actively wheezing would hold on weaning any further his prednisone.   Name: [name] MRN: [id number] DOB: 26-Nov-1950    ADMISSION DATE:  05/17/2016   SUBJECTIVE:   Mr. Cervenka is doing well this morning, he has no complaints from last evening, following on noninvasive ventilation study that his breathing is stable  Review of Systems:  Constitutional: Feels well. Cardiovascular: No chest pain.  Pulmonary: Denies dyspnea.   The remainder of systems were reviewed and were found to be negative other than what is documented in the HPI.    VITAL SIGNS: Temp:  [97.5 F (36.4 C)-98.2 F (36.8 C)] 97.8 F (36.6 C) (01/21 0938) Pulse Rate:  [52-79] 79 (01/21 0938) Resp:  [18-22] 22 (01/21 0938) BP: (114-131)/(55-65) 126/63 (01/21 0938) SpO2:  [93 %-96 %] 95 % (01/21 0938) FiO2 (%):  [36 %] 36 % (01/21 0309) Weight:  [133.9 kg (295 lb 4.8 oz)] 133.9 kg (295 lb 4.8 oz) (01/21 0500) HEMODYNAMICS:   VENTILATOR SETTINGS: FiO2 (%):  [36 %] 36 % INTAKE / OUTPUT:  Intake/Output Summary (Last 24 hours) at 05/23/16 1322 Last data filed at 05/23/16 0600  Gross per 24 hour  Intake                0 ml  Output              950 ml  Net             -950 ml    PHYSICAL EXAMINATION: Physical Examination:   VS: BP 126/63 (BP Location: Left Arm)   Pulse 79   Temp 97.8 F (36.6 C) (Oral)   Resp (!) 22   Ht 5\' 8"  (1.727 m)   Wt 133.9 kg (295 lb 4.8 oz)   SpO2 95%   BMI 44.90 kg/m   General Appearance: No distress  Neuro:without focal findings, mental status normal. HEENT: PERRLA, EOM  intact. Pulmonary: Diminished breath sounds bilaterally, prolonged expiratory phase, mild wheezing noted CardiovascularNormal S1,S2.  No m/r/g.   Abdomen: Benign, Soft, non-tender. Renal:  No costovertebral tenderness  GU:  Not performed at this time. Endocrine: No evident thyromegaly. Skin:   warm, no rashes, no ecchymosis  Extremities: normal, no cyanosis, clubbing.  LABORATORY PANEL:   CBC  Recent Labs Lab 05/22/16 0430  WBC 9.7  HGB 12.0*  HCT 36.0*  PLT 145*    Chemistries   Recent Labs Lab 05/17/16 1215  05/23/16 0605  NA 129*  < > 145  K 5.4*  < > 4.1  CL 90*  < > 105  CO2 26  < > 36*  GLUCOSE 170*  < > 86  BUN 23*  < > 21*  CREATININE 1.38*  < > 0.53*  CALCIUM 8.7*  < > 8.8*  MG  --   < > 2.1  PHOS  --   < > 2.4*  AST 83*  --   --   ALT 28  --   --   ALKPHOS 74  --   --   BILITOT 0.8  --   --   < > = values in this interval not displayed.  Recent Labs Lab 05/22/16 1647 05/22/16 2150 05/23/16 0013 05/23/16 0403 05/23/16 0759 05/23/16 1139  GLUCAP 102* 93 96 83 72 112*    Recent Labs Lab 05/18/16 0936 05/19/16 0458 05/20/16 0432  PHART 7.26* 7.33* 7.40  PCO2ART 56* 56* 58*  PO2ART 115* 137* 66*    Recent Labs Lab 05/17/16 1215  AST 83*  ALT 28  ALKPHOS 74  BILITOT 0.8  ALBUMIN 3.9    Cardiac Enzymes  Recent Labs Lab 05/17/16 1802  TROPONINI 0.30*    RADIOLOGY:  No results found.   Tora KindredJohn Myeesha Shane, DO [facility] Office Number: [phone]  Santiago Gladavid Kasa, M.D.  Stephanie AcreVishal Mungal, M.D.  Billy Fischeravid Simonds, M.D  05/23/2016

## 2016-05-24 ENCOUNTER — Telehealth: Payer: Self-pay | Admitting: Family Medicine

## 2016-05-24 DIAGNOSIS — J440 Chronic obstructive pulmonary disease with acute lower respiratory infection: Secondary | ICD-10-CM | POA: Diagnosis not present

## 2016-05-24 DIAGNOSIS — J9601 Acute respiratory failure with hypoxia: Secondary | ICD-10-CM | POA: Diagnosis not present

## 2016-05-24 DIAGNOSIS — Z7982 Long term (current) use of aspirin: Secondary | ICD-10-CM | POA: Diagnosis not present

## 2016-05-24 DIAGNOSIS — I10 Essential (primary) hypertension: Secondary | ICD-10-CM | POA: Diagnosis not present

## 2016-05-24 DIAGNOSIS — J09X1 Influenza due to identified novel influenza A virus with pneumonia: Secondary | ICD-10-CM | POA: Diagnosis not present

## 2016-05-24 DIAGNOSIS — N481 Balanitis: Secondary | ICD-10-CM | POA: Diagnosis not present

## 2016-05-24 DIAGNOSIS — J112 Influenza due to unidentified influenza virus with gastrointestinal manifestations: Secondary | ICD-10-CM | POA: Diagnosis not present

## 2016-05-24 DIAGNOSIS — J449 Chronic obstructive pulmonary disease, unspecified: Secondary | ICD-10-CM | POA: Diagnosis not present

## 2016-05-24 DIAGNOSIS — J101 Influenza due to other identified influenza virus with other respiratory manifestations: Secondary | ICD-10-CM | POA: Diagnosis not present

## 2016-05-24 DIAGNOSIS — I4891 Unspecified atrial fibrillation: Secondary | ICD-10-CM | POA: Diagnosis not present

## 2016-05-24 DIAGNOSIS — J111 Influenza due to unidentified influenza virus with other respiratory manifestations: Secondary | ICD-10-CM | POA: Diagnosis not present

## 2016-05-24 DIAGNOSIS — I110 Hypertensive heart disease with heart failure: Secondary | ICD-10-CM | POA: Diagnosis not present

## 2016-05-24 DIAGNOSIS — Z7401 Bed confinement status: Secondary | ICD-10-CM | POA: Diagnosis not present

## 2016-05-24 DIAGNOSIS — I5030 Unspecified diastolic (congestive) heart failure: Secondary | ICD-10-CM | POA: Diagnosis not present

## 2016-05-24 DIAGNOSIS — I509 Heart failure, unspecified: Secondary | ICD-10-CM | POA: Diagnosis not present

## 2016-05-24 DIAGNOSIS — J189 Pneumonia, unspecified organism: Secondary | ICD-10-CM | POA: Diagnosis not present

## 2016-05-24 LAB — GLUCOSE, CAPILLARY
GLUCOSE-CAPILLARY: 109 mg/dL — AB (ref 65–99)
GLUCOSE-CAPILLARY: 77 mg/dL (ref 65–99)
Glucose-Capillary: 94 mg/dL (ref 65–99)
Glucose-Capillary: 81 mg/dL (ref 65–99)

## 2016-05-24 LAB — BASIC METABOLIC PANEL
ANION GAP: 3 — AB (ref 5–15)
BUN: 18 mg/dL (ref 6–20)
CO2: 36 mmol/L — ABNORMAL HIGH (ref 22–32)
Calcium: 8.4 mg/dL — ABNORMAL LOW (ref 8.9–10.3)
Chloride: 103 mmol/L (ref 101–111)
Creatinine, Ser: 0.57 mg/dL — ABNORMAL LOW (ref 0.61–1.24)
GFR calc Af Amer: >60 mL/min (ref >60)
Glucose, Bld: 91 mg/dL (ref 65–99)
POTASSIUM: 3.8 mmol/L (ref 3.5–5.1)
SODIUM: 142 mmol/L (ref 135–145)

## 2016-05-24 LAB — PHOSPHORUS: Phosphorus: 3.1 mg/dL (ref 2.5–4.6)

## 2016-05-24 LAB — MAGNESIUM: MAGNESIUM: 1.9 mg/dL (ref 1.7–2.4)

## 2016-05-24 MED ORDER — GUAIFENESIN-CODEINE 100-10 MG/5ML PO SOLN: 10.0000 mL | ORAL | 0 refills | Status: DC | PRN

## 2016-05-24 MED ORDER — FUROSEMIDE 40 MG PO TABS: 40.0000 mg | ORAL_TABLET | Freq: Every day | ORAL | 0 refills | Status: DC | PRN

## 2016-05-24 NOTE — Progress Notes (Signed)
Nutrition Follow-up  DOCUMENTATION CODES:   Morbid obesity  INTERVENTION:  Encouraged patient to continue adequate intake of calories and protein.   NUTRITION DIAGNOSIS:   Inadequate oral intake related to acute illness as evidenced by NPO status.  Patient no longer NPO.  GOAL:   Patient will meet greater than or equal to 90% of their needs  Met.  MONITOR:   TF tolerance, Labs, Weight trends, Vent status  REASON FOR ASSESSMENT:   Ventilator    ASSESSMENT:   66 yo male admitted with acute respiratory failure with pneumonia, flu +.  -Patient s/p extubation 1/18 and TF was stopped day of extubation. Diet advanced to 2 gram sodium on 1/19.  Spoke with patient at bedside. He reports his appetite is good now and he is finishing 100% of meals now. Patient reports his appetite was also good PTA and his weight has been stable.   Medications reviewed and include: famotidine, Novolog sliding scale Q4hrs, prednisone 40 mg daily, senna.   Labs reviewed: CBG 77-159 past 24 hrs, CO2 36, Creatinine 0.57, Anion gap 3.   Diet Order:  Diet 2 gram sodium Room service appropriate? Yes; Fluid consistency: Thin Diet - low sodium heart healthy  Skin:  Reviewed, no issues  Last BM:  05/17/16  Height:   Ht Readings from Last 1 Encounters:  05/17/16 '5\' 8"'$  (1.727 m)    Weight:   Wt Readings from Last 1 Encounters:  05/24/16 295 lb 4.8 oz (133.9 kg)    Ideal Body Weight:  70 kg  BMI:  Body mass index is 44.9 kg/m.  Estimated Nutritional Needs:   Kcal:  9432-7614 kcals  Protein:  140-175 g  Fluid:  >/= 1.5 L  EDUCATION NEEDS:   No education needs identified at this time  Willey Blade, MS, RD, LDN Pager: [phone] After Hours Pager: [phone]

## 2016-05-24 NOTE — Progress Notes (Signed)
Report called to [facility] and EMS contacted for transport.  Danielle Shelley Pooley, RN 

## 2016-05-24 NOTE — Progress Notes (Signed)
Pharmacy Note  [name] is a 66 y.o. male with a h/o COPD and CHF admitted on 05/17/2016 with pneumonia. Patient is influenza positive. Pharmacy has been consulted for electrolyte monitoring.   Plan: 1. Electrolytes: K 3.8, Mg 1.9, Phos 3.1. No supplementation needed at this time. Will recheck K+ with AM labs.    Height: 5\' 8"  (172.7 cm) Weight: 295 lb 4.8 oz (133.9 kg) IBW/kg (Calculated) : 68.4  Temp (24hrs), Avg:97.9 F (36.6 C), Min:97.7 F (36.5 C), Max:98.1 F (36.7 C)   Recent Labs Lab 05/17/16 1215 05/17/16 1802 05/17/16 2106 05/17/16 2354 05/18/16 0653 05/19/16 0450 05/20/16 0547 05/20/16 1739 05/21/16 0611 05/22/16 0430 05/23/16 0605 05/24/16 0531  WBC 30.2*  --   --   --  26.9* 18.0* 11.6*  --  12.8* 9.7  --   --   CREATININE 1.38* 1.01  --   --  1.28* 1.04 0.58* 0.68 0.63 0.64 0.53* 0.57*  LATICACIDVEN 4.9* 1.2 1.0 1.0  --   --   --   --   --   --   --   --     Estimated Creatinine Clearance: 123.2 mL/min (by C-G formula based on SCr of 0.57 mg/dL (L)).    Allergies  Allergen Reactions  . Promethazine     Caused pt to become disoriented after receiving 6.25mg  IV.  Marland Kitchen. Aspirin     Other reaction(s): Asthma flare up  . Sulfa Antibiotics    CMP Latest Ref Rng & Units 05/24/2016 05/23/2016 05/22/2016  Glucose 65 - 99 mg/dL 91 86 098(J141(H)  BUN 6 - 20 mg/dL 18 19(J21(H) 47(W25(H)  Creatinine 0.61 - 1.24 mg/dL 2.95(A0.57(L) 2.13(Y0.53(L) 8.650.64  Sodium 135 - 145 mmol/L 142 145 146(H)  Potassium 3.5 - 5.1 mmol/L 3.8 4.1 4.5  Chloride 101 - 111 mmol/L 103 105 109  CO2 22 - 32 mmol/L 36(H) 36(H) 33(H)  Calcium 8.9 - 10.3 mg/dL 7.8(I8.4(L) 6.9(G8.8(L) 2.9(B8.8(L)  Total Protein 6.5 - 8.1 g/dL - - -  Total Bilirubin 0.3 - 1.2 mg/dL - - -  Alkaline Phos 38 - 126 U/L - - -  AST 15 - 41 U/L - - -  ALT 17 - 63 U/L - - -     Antimicrobials this admission: Tamiflu 1/15 >> 1/20 vancomycin 1/15 >> 1/16 Zosyn 1/15 >> 1/20 Levaquin 1/20 >>1/21  Dose adjustments this  admission:   Microbiology results: 1/15 BCx: NGTD 1/15 UCx: NG Sputum: pending MRSA PCR: negative  Thank you for allowing pharmacy to be a part of this patient's care.  Gardner CandleSheema M Disha Cottam, PharmD, BCPS 05/24/2016 8:44 AM

## 2016-05-24 NOTE — Progress Notes (Signed)
[facility].

## 2016-05-24 NOTE — Telephone Encounter (Signed)
Pt is being discharged from [facility] today for pneumonia.  I have scheduled a hospital follow up appointment/MW °

## 2016-05-24 NOTE — NC FL2 (Signed)
[place] MEDICAID FL2 LEVEL OF CARE SCREENING TOOL     IDENTIFICATION  Patient Name: [name] Birthdate: 04-08-1951 Sex: male Admission Date (Current Location): 05/17/2016  [place] and [place] Number:  Chiropodist and Address:  [facility], [address], [place], [place] [address]      Provider Number: [id number]  Attending Physician Name and Address:  Altamese Dilling, MD  Relative Name and Phone Number:       Current Level of Care: Hospital Recommended Level of Care: Skilled Nursing Facility Prior Approval Number:    Date Approved/Denied:   PASRR Number:  [id number] )  Discharge Plan: SNF    Current Diagnoses: Patient Active Problem List   Diagnosis Date Noted  . Influenzal pneumonia 05/17/2016  . A-fib (HCC) 10/03/2014  . Chronic asthmatic bronchitis with acute exacerbation (HCC) 10/03/2014  . Chronic respiratory failure (HCC) 10/03/2014  . CAFL (chronic airflow limitation) (HCC) 10/03/2014  . Cervical osteoarthritis 10/03/2014  . Chronic obstructive pulmonary emphysema (HCC) 10/03/2014  . Fatigue 10/03/2014  . History of atrial fibrillation 10/03/2014  . Obstructive apnea 10/03/2014  . Disturbance of skin sensation 10/03/2014  . Edema, peripheral 10/03/2014  . CCF (congestive cardiac failure) (HCC) 03/26/2014  . Chronic obstructive pulmonary disease (HCC) 09/21/2013  . Carbuncle and furuncle of buttock 12/04/2009  . Elevated WBC count 01/09/2009  . Hypoxemia 12/10/2008  . 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 Accumulation of fluid in tissues 03/31/2006  . Esophagitis, reflux 03/31/2006  . Decreased potassium in the blood 03/31/2006  . Adiposity 03/31/2006  . Primary localized osteoarthrosis, lower leg 03/31/2006  . Apnea, sleep 03/31/2006    Orientation RESPIRATION BLADDER Height & Weight     Self, Time, Situation, Place  O2 (3 Liters Oxygen ) Continent Weight: 295  lb 4.8 oz (133.9 kg) Height:  5\' 8"  (172.7 cm)  BEHAVIORAL SYMPTOMS/MOOD NEUROLOGICAL BOWEL NUTRITION STATUS   (none)  (none) Continent Diet (Diet: 2 Grams Sodium. )  AMBULATORY STATUS COMMUNICATION OF NEEDS Skin   Extensive Assist Verbally Normal                       Personal Care Assistance Level of Assistance  Bathing, Feeding, Dressing Bathing Assistance: Limited assistance Feeding assistance: Independent Dressing Assistance: Limited assistance     Functional Limitations Info  Sight, Hearing, Speech Sight Info: Adequate Hearing Info: Adequate Speech Info: Adequate    SPECIAL CARE FACTORS FREQUENCY  PT (By licensed PT), OT (By licensed OT)     PT Frequency:  (5) OT Frequency:  (5)            Contractures      Additional Factors Info  Code Status, Allergies, Insulin Sliding Scale, Isolation Precautions Code Status Info:  (Full Code. ) Allergies Info:  (Promethazine, Aspirin, Sulfa Antibiotics)   Insulin Sliding Scale Info:  (NovoLog Insulin Injections. ) Isolation Precautions Info:  (Positive for the flu)     Current Medications (05/24/2016):  This is the current hospital active medication list Current Facility-Administered Medications  Medication Dose Route Frequency Provider Last Rate Last Dose  . 0.9 %  sodium chloride infusion  250 mL Intravenous PRN Shane Crutch, MD 10 mL/hr at 05/17/16 2120 250 mL at 05/17/16 2120  . acetaminophen (TYLENOL) tablet 650 mg  650 mg Oral Q4H PRN Shane Crutch, MD   650 mg at 05/20/16 2028  . digoxin (LANOXIN) tablet 0.25 mg  0.25 mg Oral Daily Pradeep  Nicholos Johnsamachandran, MD   0.25 mg at 05/23/16 0939  . enoxaparin (LOVENOX) injection 40 mg  40 mg Subcutaneous Q12H Valentina GuScott D Christy, RPH   40 mg at 05/23/16 2043  . famotidine (PEPCID) tablet 20 mg  20 mg Oral Daily Shane CrutchPradeep Ramachandran, MD   20 mg at 05/23/16 0939  . guaiFENesin-codeine 100-10 MG/5ML solution 10 mL  10 mL Oral Q4H PRN Lewie LoronMagadalene S Tukov, NP   10 mL at  05/23/16 0008  . Influenza vac split quadrivalent PF (FLUARIX) injection 0.5 mL  0.5 mL Intramuscular Prior to discharge Shane CrutchPradeep Ramachandran, MD      . insulin aspart (novoLOG) injection 0-20 Units  0-20 Units Subcutaneous Q4H Eugenie Norrieana G Blakeney, NP   4 Units at 05/23/16 1757  . ipratropium-albuterol (DUONEB) 0.5-2.5 (3) MG/3ML nebulizer solution 3 mL  3 mL Nebulization Q4H Altamese DillingVaibhavkumar Damani Rando, MD   3 mL at 05/24/16 0801  . MEDLINE mouth rinse  15 mL Mouth Rinse BID Shane CrutchPradeep Ramachandran, MD   15 mL at 05/23/16 2043  . metoprolol (LOPRESSOR) tablet 50 mg  50 mg Oral q morning - 10a Altamese DillingVaibhavkumar Elizet Kaplan, MD   50 mg at 05/23/16 0939  . mometasone-formoterol (DULERA) 200-5 MCG/ACT inhaler 2 puff  2 puff Inhalation BID Altamese DillingVaibhavkumar Yevonne Yokum, MD   2 puff at 05/23/16 2042  . ondansetron (ZOFRAN) injection 4 mg  4 mg Intravenous Q6H PRN Shane CrutchPradeep Ramachandran, MD   4 mg at 05/21/16 0139  . pneumococcal 23 valent vaccine (PNU-IMMUNE) injection 0.5 mL  0.5 mL Intramuscular Prior to discharge Shane CrutchPradeep Ramachandran, MD      . predniSONE (DELTASONE) tablet 40 mg  40 mg Oral Q breakfast John Conforti, DO   40 mg at 05/23/16 0804  . senna-docusate (Senokot-S) tablet 1 tablet  1 tablet Oral QHS Shane CrutchPradeep Ramachandran, MD   1 tablet at 05/22/16 2208     Discharge Medications: Please see discharge summary for a list of discharge medications.  Relevant Imaging Results:  Relevant Lab Results:   Additional Information  (SSN: [id number])  Sample, Darleen CrockerBailey M, LCSW

## 2016-05-24 NOTE — Discharge Summary (Addendum)
[facility] - [place] at [facility]   PATIENT NAME: [name]    MR#:  [id number]  DATE OF BIRTH:  January 12, 1951  DATE OF ADMISSION:  05/17/2016 ADMITTING PHYSICIAN: Shane Crutch, MD  DATE OF DISCHARGE: No discharge date for patient encounter.  PRIMARY CARE PHYSICIAN: Dortha Kern, PA    ADMISSION DIAGNOSIS:  unresponsive  DISCHARGE DIAGNOSIS:  Active Problems:   Influenzal pneumonia   SECONDARY DIAGNOSIS:   Past Medical History:  Diagnosis Date  . Anxiety   . Asthma   . CHF (congestive heart failure) (HCC)   . COPD (chronic obstructive pulmonary disease) (HCC)   . Dyspnea   . Hypertension     HOSPITAL COURSE:   * Ac on ch respi failure   Influenza A   Pneumonia   Septic shock on presentation- resolved.   COPD exacerbation.   USes CPAP at night and oxygen via nasal canula.    Supplementation oxygen, Off Ventilator , exctubated 05/20/16.   IV steroids, nebs   Tamiflu.- finished course.   Zosyn for PNA- for 5 days. Now oral levaquin. Finished total 7 days Abx course.   Taper steroids to oral. May stop in 5-6 days.   Encouraged to use spirometer and flutter valve.  * Hx of A fib and Diastolic CHF   No exacerbation   On digoxin, hold metoprolol due to bradycardia.  * Ac Kidney injury on presentation   With hyperkalemia   Resolved now.  * hypertension   Stable now, monitor.  DISCHARGE CONDITIONS:   Stable.  CONSULTS OBTAINED:    DRUG ALLERGIES:   Allergies  Allergen Reactions  . Promethazine     Caused pt to become disoriented after receiving 6.25mg  IV.  Marland Kitchen Aspirin     Other reaction(s): Asthma flare up  . Sulfa Antibiotics     DISCHARGE MEDICATIONS:   Current Discharge Medication List    START taking these medications   Details  guaiFENesin-codeine 100-10 MG/5ML syrup Take 10 mLs by mouth every 4 (four) hours as needed for cough. Qty: 120 mL, Refills: 0      CONTINUE these medications which  have CHANGED   Details  furosemide (LASIX) 40 MG tablet Take 1 tablet (40 mg total) by mouth daily as needed for edema. Qty: 30 tablet, Refills: 0   Associated Diagnoses: Edema, peripheral      CONTINUE these medications which have NOT CHANGED   Details  ADVAIR DISKUS 500-50 MCG/DOSE AEPB INHALE 1 PUFF TWICE DAILY.  RINSE MOUTH AFTER EACH USE Qty: 60 each, Refills: 12   Associated Diagnoses: Chronic respiratory failure with hypoxia (HCC)    albuterol (PROVENTIL HFA;VENTOLIN HFA) 108 (90 BASE) MCG/ACT inhaler Inhale 2 puffs into the lungs 2 (two) times daily.    aspirin 81 MG tablet Take 1 tablet by mouth daily.    COMBIVENT RESPIMAT 20-100 MCG/ACT AERS respimat USE ONE PUFF FOUR TIMES DAILY Qty: 4 g, Refills: 12    digoxin (LANOXIN) 0.25 MG tablet Take 0.25 mg by mouth daily.    ibuprofen (ADVIL,MOTRIN) 200 MG tablet Take 1 tablet by mouth as needed.    montelukast (SINGULAIR) 10 MG tablet TAKE ONE TABLET AT BEDTIME Qty: 30 tablet, Refills: 12    MULTIPLE VITAMIN PO Take 1 tablet by mouth daily.    potassium chloride SA (K-DUR,KLOR-CON) 20 MEQ tablet Take 1 tablet by mouth daily. Reported on 10/24/2015    predniSONE (DELTASONE) 10 MG tablet TAKE ONE (1) TABLET EACH DAY Qty: 30 tablet,  Refills: 3   Associated Diagnoses: Chronic respiratory failure with hypoxia (HCC)    ranitidine (ZANTAC) 150 MG tablet TAKE ONE TABLET EVERY 12 HOURS Qty: 60 tablet, Refills: 6    TUDORZA PRESSAIR 400 MCG/ACT AEPB       STOP taking these medications     diltiazem (TIAZAC) 240 MG 24 hr capsule      metoprolol (LOPRESSOR) 50 MG tablet      morphine 20 MG/5ML solution          DISCHARGE INSTRUCTIONS:    Follow with PMD in 1-2 weeks.  If you experience worsening of your admission symptoms, develop shortness of breath, life threatening emergency, suicidal or homicidal thoughts you must seek medical attention immediately by calling 911 or calling your MD immediately  if symptoms less  severe.  You Must read complete instructions/literature along with all the possible adverse reactions/side effects for all the Medicines you take and that have been prescribed to you. Take any new Medicines after you have completely understood and accept all the possible adverse reactions/side effects.   Please note  You were cared for by a hospitalist during your hospital stay. If you have any questions about your discharge medications or the care you received while you were in the hospital after you are discharged, you can call the unit and asked to speak with the hospitalist on call if the hospitalist that took care of you is not available. Once you are discharged, your primary care physician will handle any further medical issues. Please note that NO REFILLS for any discharge medications will be authorized once you are discharged, as it is imperative that you return to your primary care physician (or establish a relationship with a primary care physician if you do not have one) for your aftercare needs so that they can reassess your need for medications and monitor your lab values.    Today   CHIEF COMPLAINT:   Chief Complaint  Patient presents with  . Respiratory Distress    HISTORY OF PRESENT ILLNESS:  [name]  is a 66 y.o. male currently intubated and on the ventilator, therefore, all history was obtained from the chart, from staff, and the patient's wife was at the bedside. Patient's wife had noted that she had flulike symptoms approximately one week ago, patient started noticing symptoms approximately 3 days ago, he noted that he was having increasing cough, dyspnea and fatigue. These symptoms progressed over the last 3 days until last night. He was very fatigued. He was too uncomfortable to go to sleep in his bed due to difficulty breathing when laying down, he therefore sat at the kitchen table in order to breathe better. She last saw him in the evening around midnight, she  then saw him around 6 AM. At that time, he was a lethargic but arousable, they then called EMS where was noted that his oxygen saturation was 70%. He was brought to the emergency room where chest x-ray showed an infarct which was noted to be "wedge-shaped". Subsequently the ED doctor intubated the patient, due to severe lethargy and respiratory distress, upon intubation was noted. Patient had copious thick brown secretions thought to be consistent with the patient's presentation of likely pneumonia. The patient's flu swab was positive for influenza A.    VITAL SIGNS:  Blood pressure 122/60, pulse 87, temperature 98 F (36.7 C), temperature source Oral, resp. rate 20, height 5\' 8"  (1.727 m), weight 133.9 kg (295 lb 4.8 oz), SpO2 95 %.  I/O:   Intake/Output Summary (Last 24 hours) at 05/24/16 0919 Last data filed at 05/24/16 0603  Gross per 24 hour  Intake                0 ml  Output              900 ml  Net             -900 ml    PHYSICAL EXAMINATION:   currently intubated and on the ventilator, therefore, all history was obtained from the chart, from staff, and the patient's wife was at the bedside. Patient's wife had noted that she had flulike symptoms approximately one week ago, patient started noticing symptoms approximately 3 days ago, he noted that he was having increasing cough, dyspnea and fatigue. These symptoms progressed over the last 3 days until last night. He was very fatigued. He was too uncomfortable to go to sleep in his bed due to difficulty breathing when laying down, he therefore sat at the kitchen table in order to breathe better. She last saw him in the evening around midnight, she then saw him around 6 AM. At that time, he was a lethargic but arousable, they then called EMS where was noted that his oxygen saturation was 70%. He was brought to the emergency room where chest x-ray showed an infarct which was noted to be "wedge-shaped". Subsequently the ED doctor intubated  the patient, due to severe lethargy and respiratory distress, upon intubation was noted. Patient had copious thick brown secretions thought to be consistent with the patient's presentation of likely pneumonia. The patient's flu swab was positive for influenza A.   DATA REVIEW:   CBC  Recent Labs Lab 05/22/16 0430  WBC 9.7  HGB 12.0*  HCT 36.0*  PLT 145*    Chemistries   Recent Labs Lab 05/17/16 1215  05/24/16 0531  NA 129*  < > 142  K 5.4*  < > 3.8  CL 90*  < > 103  CO2 26  < > 36*  GLUCOSE 170*  < > 91  BUN 23*  < > 18  CREATININE 1.38*  < > 0.57*  CALCIUM 8.7*  < > 8.4*  MG  --   < > 1.9  AST 83*  --   --   ALT 28  --   --   ALKPHOS 74  --   --   BILITOT 0.8  --   --   < > = values in this interval not displayed.  Cardiac Enzymes  Recent Labs Lab 05/17/16 1802  TROPONINI 0.30*    Microbiology Results  Results for orders placed or performed during the hospital encounter of 05/17/16  CULTURE, BLOOD (ROUTINE X 2) w Reflex to ID Panel     Status: None   Collection Time: 05/17/16 12:17 PM  Result Value Ref Range Status   Specimen Description BLOOD LEFT HAND  Final   Special Requests   Final    BOTTLES DRAWN AEROBIC AND ANAEROBIC AER ANA   Culture NO GROWTH 5 DAYS  Final   Report Status 05/22/2016 FINAL  Final  Urine culture     Status: None   Collection Time: 05/17/16 12:17 PM  Result Value Ref Range Status   Specimen Description URINE, RANDOM  Final   Special Requests NONE  Final   Culture   Final    NO GROWTH Performed at [facility], [address] [place], [place] [address]  Report Status 05/18/2016 FINAL  Final  CULTURE, BLOOD (ROUTINE X 2) w Reflex to ID Panel     Status: None   Collection Time: 05/17/16 12:19 PM  Result Value Ref Range Status   Specimen Description BLOOD RIGHT HAND  Final   Special Requests   Final    BOTTLES DRAWN AEROBIC AND ANAEROBIC AER 7ML ANA 9ML   Culture NO GROWTH 5 DAYS  Final   Report Status  05/22/2016 FINAL  Final  MRSA PCR Screening     Status: None   Collection Time: 05/17/16  4:22 PM  Result Value Ref Range Status   MRSA by PCR NEGATIVE NEGATIVE Final    Comment:        The GeneXpert MRSA Assay (FDA approved for NASAL specimens only), is one component of a comprehensive MRSA colonization surveillance program. It is not intended to diagnose MRSA infection nor to guide or monitor treatment for MRSA infections.     RADIOLOGY:  No results found.  EKG:   Orders placed or performed during the hospital encounter of 05/17/16  . ED EKG  . ED EKG  . EKG 12-Lead  . EKG 12-Lead  . EKG 12-Lead  . EKG 12-Lead      Management plans discussed with the patient, family and they are in agreement.  CODE STATUS:     Code Status Orders        Start     Ordered   05/17/16 1402  Full code  Continuous     05/17/16 1404    Code Status History    Date Active Date Inactive Code Status Order ID Comments User Context   This patient has a current code status but no historical code status.    Advance Directive Documentation   Flowsheet Row Most Recent Value  Type of Advance Directive  Healthcare Power of Attorney  Pre-existing out of facility DNR order (yellow form or pink MOST form)  No data  "MOST" Form in Place?  No data      TOTAL TIME TAKING CARE OF THIS PATIENT: 35 minutes.    Altamese DillingVACHHANI, Luz Mares M.D on 05/24/2016 at 9:19 AM  Between 7am to 6pm - Pager - [phone]  After 6pm go to [email or web address] - password [email or web address]  [facility]  Office  [phone]  CC: Primary care physician; Dortha Kernennis Chrismon, PA   Note: This dictation was prepared with Dragon dictation along with smaller phrase technology. Any transcriptional errors that result from this process are unintentional.

## 2016-05-24 NOTE — Clinical Social Work Note (Signed)
Clinical Social Work Assessment  Patient Details  Name: [name] MRN: [id number] Date of Birth: June 07, 1950  Date of referral:  05/24/16               Reason for consult:  Facility Placement                Permission sought to share information with:  Chartered certified accountant granted to share information::  Yes, Verbal Permission Granted  Name::      [place]::   [place]   Relationship::     Contact Information:     Housing/Transportation Living arrangements for the past 2 months:  [place] of Information:  Patient, Spouse Patient Interpreter Needed:  None Criminal Activity/Legal Involvement Pertinent to Current Situation/Hospitalization:  No - Comment as needed Significant Relationships:  Adult Children, Spouse Lives with:  Spouse Do you feel safe going back to the place where you live?  Yes Need for family participation in patient care:  Yes (Comment)  Care giving concerns:  Patient lives with his wife Hilda Blades in [place].    Social Worker assessment / plan:  Holiday representative (CSW) reviewed chart and noted that PT is recommending SNF and patient is ready for D/C today. CSW contacted patient's wife Hilda Blades to discuss case. Per wife patient lives with her in [place] and she would like for him to go to rehab at a SNF because he is so weak. Per wife patient has been to [facility] before but she is open to other facilities. CSW explained that SNF choices will be limited because patient is positive for the flu. Per wife patient has a C-pap at home that she will bring to rehab for him to use at night. CSW explained that patient has met the 3 night inpatient stay criteria for medicare to pay for rehab, he was admitted to inpatient on 05/17/16. FL2 complete and faxed out. CSW presented bed offers to wife and she chose [facility].   Patient is medically stable for D/C to [facility] today. Per [facility] admissions  coordinator at [facility] patient will go to room 511. RN will call report and arrange EMS for transport. CSW sent D/C orders to [facility] via [place]. Patient is aware of above. Wife is aware of above. Please reconsult if future social work needs arise. CSW signing off.   Employment status:  Retired Forensic scientist:  Medicare PT Recommendations:  [place] / Referral to community resources:  [place]  Patient/Family's Response to care:  Patient and wife are agreeable for patient to go to [facility] today.   Patient/Family's Understanding of and Emotional Response to Diagnosis, Current Treatment, and Prognosis:  Patient and wife were very pleasant and thanked CSW for assistance.   Emotional Assessment Appearance:  Appears stated age Attitude/Demeanor/Rapport:    Affect (typically observed):  Accepting, Adaptable, Pleasant Orientation:  Oriented to Self, Oriented to Place, Oriented to  Time, Oriented to Situation Alcohol / Substance use:  Not Applicable Psych involvement (Current and /or in the community):  No (Comment)  Discharge Needs  Concerns to be addressed:  Discharge Planning Concerns Readmission within the last 30 days:  No Current discharge risk:  None Barriers to Discharge:  No Barriers Identified   Arnez Stoneking, Veronia Beets, LCSW 05/24/2016, 11:09 AM

## 2016-05-24 NOTE — Care Management Important Message (Signed)
Important Message  Patient Details  Name: [name] MRN: [id number] Date of Birth: May 20, 1950   Medicare Important Message Given:  Yes    Gwenette GreetBrenda S Laurence Crofford, RN 05/24/2016, 8:53 AM

## 2016-05-24 NOTE — Clinical Social Work Placement (Signed)
   CLINICAL SOCIAL WORK PLACEMENT  NOTE  Date:  05/24/2016  Patient Details  Name: Christopher StanleyWillard H Zieske MRN: [id number] Date of Birth: [birth year]  Clinical Social Work is seeking post-discharge placement for this patient at the Skilled  Nursing Facility level of care (*CSW will initial, date and re-position this form in  chart as items are completed):  Yes   Patient/family provided with [place] Clinical Social Work Department's list of facilities offering this level of care within the geographic area requested by the patient (or if unable, by the patient's family).  Yes   Patient/family informed of their freedom to choose among providers that offer the needed level of care, that participate in Medicare, Medicaid or managed care program needed by the patient, have an available bed and are willing to accept the patient.  Yes   Patient/family informed of [place]'s ownership interest [facility] and [facility], as well as of the fact that they are under no obligation to receive care at these facilities.  PASRR submitted to EDS on       PASRR number received on       Existing PASRR number confirmed on 05/24/16     FL2 transmitted to all facilities in geographic area requested by pt/family on 05/24/16     FL2 transmitted to all facilities within larger geographic area on       Patient informed that his/her managed care company has contracts with or will negotiate with certain facilities, including the following:        Yes   Patient/family informed of bed offers received.  [facility]  [facility] )     Physician recommends and patient chooses bed at      Patient to be transferred to  [facility] ) on 05/24/16.  Patient to be transferred to facility by  [facility] )     Patient family notified on 05/24/16 of transfer.  Name of family member notified:   (Patient's wife Stanton KidneyDebra is aware of D/C today. )     PHYSICIAN        Additional Comment:    _______________________________________________ Heather Streeper, Darleen CrockerBailey M, LCSW 05/24/2016, 11:08 AM

## 2016-05-25 ENCOUNTER — Telehealth: Payer: Self-pay

## 2016-05-26 DIAGNOSIS — J449 Chronic obstructive pulmonary disease, unspecified: Secondary | ICD-10-CM | POA: Diagnosis not present

## 2016-05-26 DIAGNOSIS — J112 Influenza due to unidentified influenza virus with gastrointestinal manifestations: Secondary | ICD-10-CM | POA: Diagnosis not present

## 2016-05-26 DIAGNOSIS — I4891 Unspecified atrial fibrillation: Secondary | ICD-10-CM | POA: Diagnosis not present

## 2016-05-26 DIAGNOSIS — I509 Heart failure, unspecified: Secondary | ICD-10-CM | POA: Diagnosis not present

## 2016-05-27 NOTE — Telephone Encounter (Signed)
Tried to contact pt on three different occasions. Left a voicemail on wife's cell #. Home number did not have a voicemail. No call back, FYI.  -MMw

## 2016-05-27 NOTE — Telephone Encounter (Signed)
Try to contact again if no return call in 2 days.

## 2016-05-28 ENCOUNTER — Inpatient Hospital Stay: Payer: Medicare Other | Admitting: Family Medicine

## 2016-06-01 DIAGNOSIS — I509 Heart failure, unspecified: Secondary | ICD-10-CM | POA: Diagnosis not present

## 2016-06-01 DIAGNOSIS — J189 Pneumonia, unspecified organism: Secondary | ICD-10-CM | POA: Diagnosis not present

## 2016-06-01 DIAGNOSIS — J111 Influenza due to unidentified influenza virus with other respiratory manifestations: Secondary | ICD-10-CM | POA: Diagnosis not present

## 2016-06-01 DIAGNOSIS — N481 Balanitis: Secondary | ICD-10-CM | POA: Diagnosis not present

## 2016-06-07 ENCOUNTER — Other Ambulatory Visit: Payer: Self-pay | Admitting: Family Medicine

## 2016-06-07 DIAGNOSIS — F419 Anxiety disorder, unspecified: Secondary | ICD-10-CM | POA: Diagnosis not present

## 2016-06-07 DIAGNOSIS — M6281 Muscle weakness (generalized): Secondary | ICD-10-CM | POA: Diagnosis not present

## 2016-06-07 DIAGNOSIS — J45902 Unspecified asthma with status asthmaticus: Secondary | ICD-10-CM | POA: Diagnosis not present

## 2016-06-07 DIAGNOSIS — E669 Obesity, unspecified: Secondary | ICD-10-CM | POA: Diagnosis not present

## 2016-06-07 DIAGNOSIS — J441 Chronic obstructive pulmonary disease with (acute) exacerbation: Secondary | ICD-10-CM | POA: Diagnosis not present

## 2016-06-07 DIAGNOSIS — G4730 Sleep apnea, unspecified: Secondary | ICD-10-CM | POA: Diagnosis not present

## 2016-06-07 DIAGNOSIS — I110 Hypertensive heart disease with heart failure: Secondary | ICD-10-CM | POA: Diagnosis not present

## 2016-06-07 DIAGNOSIS — I4891 Unspecified atrial fibrillation: Secondary | ICD-10-CM | POA: Diagnosis not present

## 2016-06-07 DIAGNOSIS — Z87891 Personal history of nicotine dependence: Secondary | ICD-10-CM | POA: Diagnosis not present

## 2016-06-07 DIAGNOSIS — M47812 Spondylosis without myelopathy or radiculopathy, cervical region: Secondary | ICD-10-CM | POA: Diagnosis not present

## 2016-06-07 DIAGNOSIS — I5032 Chronic diastolic (congestive) heart failure: Secondary | ICD-10-CM | POA: Diagnosis not present

## 2016-06-07 DIAGNOSIS — Z9981 Dependence on supplemental oxygen: Secondary | ICD-10-CM | POA: Diagnosis not present

## 2016-06-07 NOTE — Telephone Encounter (Signed)
Carollee HerterShannon with [facility] called wanting to know if she can check his blood sugar.  Pt also has a rash in his skin folds and they want to know if she could get and order for nystatin.  Her call back is [phone]  Thank sTeri

## 2016-06-07 NOTE — Telephone Encounter (Signed)
If having hypoglycemia symptoms, may check blood sugar at random. Otherwise, check fasting blood sugar in the morning. Nystatin 100,000 units/g sprinkle on rash BID 30 grams.

## 2016-06-08 DIAGNOSIS — I4891 Unspecified atrial fibrillation: Secondary | ICD-10-CM | POA: Diagnosis not present

## 2016-06-08 DIAGNOSIS — J441 Chronic obstructive pulmonary disease with (acute) exacerbation: Secondary | ICD-10-CM | POA: Diagnosis not present

## 2016-06-08 DIAGNOSIS — J45902 Unspecified asthma with status asthmaticus: Secondary | ICD-10-CM | POA: Diagnosis not present

## 2016-06-08 DIAGNOSIS — I5032 Chronic diastolic (congestive) heart failure: Secondary | ICD-10-CM | POA: Diagnosis not present

## 2016-06-08 DIAGNOSIS — M6281 Muscle weakness (generalized): Secondary | ICD-10-CM | POA: Diagnosis not present

## 2016-06-08 DIAGNOSIS — I110 Hypertensive heart disease with heart failure: Secondary | ICD-10-CM | POA: Diagnosis not present

## 2016-06-08 MED ORDER — NYSTATIN 100000 UNIT/GM EX POWD: Freq: Two times a day (BID) | CUTANEOUS | 0 refills | Status: DC

## 2016-06-08 NOTE — Telephone Encounter (Signed)
Spoke with Carollee HerterShannon with [facility] she states patient has a rash under skin folds. [facility].   Patient is also requesting a blood sugar check. He does not have a glucometer at home. [facility] is able to draw labs to check blood sugar if needed. Patient states only symptom he is having is blurred vision in the mornings, and if sitting to far from TV.   Patient has a hospital follow up scheduled for 2/27. Tried to reschedule appointment for sooner but patient does not want to come in the office because of sickness in the waiting room.  Patient advise if okay to have [facility] draw labs to check glucose level.

## 2016-06-08 NOTE — Telephone Encounter (Signed)
Draw labs for CMP, Hgb A1C and CBC with differential to follow up history of Influenza, pneumonia and elevated glucose.

## 2016-06-09 NOTE — Telephone Encounter (Signed)
[facility]

## 2016-06-09 NOTE — Telephone Encounter (Signed)
Verbal order for labs to be drawn [facility] to [facility].

## 2016-06-10 ENCOUNTER — Other Ambulatory Visit: Payer: Self-pay | Admitting: *Deleted

## 2016-06-10 ENCOUNTER — Encounter: Payer: Self-pay | Admitting: *Deleted

## 2016-06-10 DIAGNOSIS — I5032 Chronic diastolic (congestive) heart failure: Secondary | ICD-10-CM | POA: Diagnosis not present

## 2016-06-10 DIAGNOSIS — J441 Chronic obstructive pulmonary disease with (acute) exacerbation: Secondary | ICD-10-CM | POA: Diagnosis not present

## 2016-06-10 DIAGNOSIS — I110 Hypertensive heart disease with heart failure: Secondary | ICD-10-CM | POA: Diagnosis not present

## 2016-06-10 DIAGNOSIS — I4891 Unspecified atrial fibrillation: Secondary | ICD-10-CM | POA: Diagnosis not present

## 2016-06-10 DIAGNOSIS — E119 Type 2 diabetes mellitus without complications: Secondary | ICD-10-CM | POA: Diagnosis not present

## 2016-06-10 DIAGNOSIS — M6281 Muscle weakness (generalized): Secondary | ICD-10-CM | POA: Diagnosis not present

## 2016-06-10 DIAGNOSIS — J45902 Unspecified asthma with status asthmaticus: Secondary | ICD-10-CM | POA: Diagnosis not present

## 2016-06-10 NOTE — Patient Outreach (Signed)
Met with Helene Kelp, SW at facility, she reports patient discharged 06/08/16. She was able to set up home care and patient is on oxygen continuous.   Call to patient, spoke with patent, he confirms he has home care and they have already been out to visit, he has a nurse and therapist. Patient states he had the flu and pneumonia, he is getting better day by day and is happy to be home. He confirms his wife assists him as needed.  RNCM reviewed [facility] program services. He states he does not want to sign up at this time.   Plan to sign off case.  Will send letter and information for future reference. Royetta Crochet. Laymond Purser, RN, BSN, [place] [phone]) Business Cell  [phone]) Toll Free Office

## 2016-06-11 NOTE — Telephone Encounter (Signed)
Pt or spouse still has not returned call. Pt has upcoming apt for 06/29/16. FYI - MM

## 2016-06-11 NOTE — Telephone Encounter (Signed)
See telephone messages under NOTES tab for dates 06-07-16 through 06-10-16 regarding home health nurse visits and patient's worry about coming into the office because of "so much flu out".

## 2016-06-15 DIAGNOSIS — I110 Hypertensive heart disease with heart failure: Secondary | ICD-10-CM | POA: Diagnosis not present

## 2016-06-15 DIAGNOSIS — J441 Chronic obstructive pulmonary disease with (acute) exacerbation: Secondary | ICD-10-CM | POA: Diagnosis not present

## 2016-06-15 DIAGNOSIS — I5032 Chronic diastolic (congestive) heart failure: Secondary | ICD-10-CM | POA: Diagnosis not present

## 2016-06-15 DIAGNOSIS — I4891 Unspecified atrial fibrillation: Secondary | ICD-10-CM | POA: Diagnosis not present

## 2016-06-15 DIAGNOSIS — M6281 Muscle weakness (generalized): Secondary | ICD-10-CM | POA: Diagnosis not present

## 2016-06-15 DIAGNOSIS — J45902 Unspecified asthma with status asthmaticus: Secondary | ICD-10-CM | POA: Diagnosis not present

## 2016-06-16 NOTE — Telephone Encounter (Signed)
Noted, thank you.  -MM 

## 2016-06-17 DIAGNOSIS — I4891 Unspecified atrial fibrillation: Secondary | ICD-10-CM | POA: Diagnosis not present

## 2016-06-17 DIAGNOSIS — M6281 Muscle weakness (generalized): Secondary | ICD-10-CM | POA: Diagnosis not present

## 2016-06-17 DIAGNOSIS — J441 Chronic obstructive pulmonary disease with (acute) exacerbation: Secondary | ICD-10-CM | POA: Diagnosis not present

## 2016-06-17 DIAGNOSIS — I110 Hypertensive heart disease with heart failure: Secondary | ICD-10-CM | POA: Diagnosis not present

## 2016-06-17 DIAGNOSIS — I5032 Chronic diastolic (congestive) heart failure: Secondary | ICD-10-CM | POA: Diagnosis not present

## 2016-06-17 DIAGNOSIS — J45902 Unspecified asthma with status asthmaticus: Secondary | ICD-10-CM | POA: Diagnosis not present

## 2016-06-21 DIAGNOSIS — I110 Hypertensive heart disease with heart failure: Secondary | ICD-10-CM | POA: Diagnosis not present

## 2016-06-21 DIAGNOSIS — M6281 Muscle weakness (generalized): Secondary | ICD-10-CM | POA: Diagnosis not present

## 2016-06-21 DIAGNOSIS — J441 Chronic obstructive pulmonary disease with (acute) exacerbation: Secondary | ICD-10-CM | POA: Diagnosis not present

## 2016-06-21 DIAGNOSIS — I4891 Unspecified atrial fibrillation: Secondary | ICD-10-CM | POA: Diagnosis not present

## 2016-06-21 DIAGNOSIS — I5032 Chronic diastolic (congestive) heart failure: Secondary | ICD-10-CM | POA: Diagnosis not present

## 2016-06-21 DIAGNOSIS — J45902 Unspecified asthma with status asthmaticus: Secondary | ICD-10-CM | POA: Diagnosis not present

## 2016-06-22 DIAGNOSIS — M6281 Muscle weakness (generalized): Secondary | ICD-10-CM | POA: Diagnosis not present

## 2016-06-22 DIAGNOSIS — I110 Hypertensive heart disease with heart failure: Secondary | ICD-10-CM | POA: Diagnosis not present

## 2016-06-22 DIAGNOSIS — J45902 Unspecified asthma with status asthmaticus: Secondary | ICD-10-CM | POA: Diagnosis not present

## 2016-06-22 DIAGNOSIS — I5032 Chronic diastolic (congestive) heart failure: Secondary | ICD-10-CM | POA: Diagnosis not present

## 2016-06-22 DIAGNOSIS — I4891 Unspecified atrial fibrillation: Secondary | ICD-10-CM | POA: Diagnosis not present

## 2016-06-22 DIAGNOSIS — J441 Chronic obstructive pulmonary disease with (acute) exacerbation: Secondary | ICD-10-CM | POA: Diagnosis not present

## 2016-06-24 DIAGNOSIS — J441 Chronic obstructive pulmonary disease with (acute) exacerbation: Secondary | ICD-10-CM | POA: Diagnosis not present

## 2016-06-24 DIAGNOSIS — M6281 Muscle weakness (generalized): Secondary | ICD-10-CM | POA: Diagnosis not present

## 2016-06-24 DIAGNOSIS — I5032 Chronic diastolic (congestive) heart failure: Secondary | ICD-10-CM | POA: Diagnosis not present

## 2016-06-24 DIAGNOSIS — J45902 Unspecified asthma with status asthmaticus: Secondary | ICD-10-CM | POA: Diagnosis not present

## 2016-06-24 DIAGNOSIS — I4891 Unspecified atrial fibrillation: Secondary | ICD-10-CM | POA: Diagnosis not present

## 2016-06-24 DIAGNOSIS — I110 Hypertensive heart disease with heart failure: Secondary | ICD-10-CM | POA: Diagnosis not present

## 2016-06-28 DIAGNOSIS — H353134 Nonexudative age-related macular degeneration, bilateral, advanced atrophic with subfoveal involvement: Secondary | ICD-10-CM | POA: Diagnosis not present

## 2016-06-29 ENCOUNTER — Ambulatory Visit: Payer: Medicare Other | Admitting: Family Medicine

## 2016-06-29 DIAGNOSIS — H353133 Nonexudative age-related macular degeneration, bilateral, advanced atrophic without subfoveal involvement: Secondary | ICD-10-CM | POA: Diagnosis not present

## 2016-06-30 DIAGNOSIS — I4891 Unspecified atrial fibrillation: Secondary | ICD-10-CM | POA: Diagnosis not present

## 2016-06-30 DIAGNOSIS — J45902 Unspecified asthma with status asthmaticus: Secondary | ICD-10-CM | POA: Diagnosis not present

## 2016-06-30 DIAGNOSIS — J441 Chronic obstructive pulmonary disease with (acute) exacerbation: Secondary | ICD-10-CM | POA: Diagnosis not present

## 2016-06-30 DIAGNOSIS — I110 Hypertensive heart disease with heart failure: Secondary | ICD-10-CM | POA: Diagnosis not present

## 2016-06-30 DIAGNOSIS — I5032 Chronic diastolic (congestive) heart failure: Secondary | ICD-10-CM | POA: Diagnosis not present

## 2016-06-30 DIAGNOSIS — M6281 Muscle weakness (generalized): Secondary | ICD-10-CM | POA: Diagnosis not present

## 2016-07-01 DIAGNOSIS — I5032 Chronic diastolic (congestive) heart failure: Secondary | ICD-10-CM | POA: Diagnosis not present

## 2016-07-01 DIAGNOSIS — I110 Hypertensive heart disease with heart failure: Secondary | ICD-10-CM | POA: Diagnosis not present

## 2016-07-01 DIAGNOSIS — I4891 Unspecified atrial fibrillation: Secondary | ICD-10-CM | POA: Diagnosis not present

## 2016-07-01 DIAGNOSIS — J45902 Unspecified asthma with status asthmaticus: Secondary | ICD-10-CM | POA: Diagnosis not present

## 2016-07-01 DIAGNOSIS — J441 Chronic obstructive pulmonary disease with (acute) exacerbation: Secondary | ICD-10-CM | POA: Diagnosis not present

## 2016-07-01 DIAGNOSIS — M6281 Muscle weakness (generalized): Secondary | ICD-10-CM | POA: Diagnosis not present

## 2016-07-06 DIAGNOSIS — M6281 Muscle weakness (generalized): Secondary | ICD-10-CM | POA: Diagnosis not present

## 2016-07-06 DIAGNOSIS — J45902 Unspecified asthma with status asthmaticus: Secondary | ICD-10-CM | POA: Diagnosis not present

## 2016-07-06 DIAGNOSIS — I5032 Chronic diastolic (congestive) heart failure: Secondary | ICD-10-CM | POA: Diagnosis not present

## 2016-07-06 DIAGNOSIS — I110 Hypertensive heart disease with heart failure: Secondary | ICD-10-CM | POA: Diagnosis not present

## 2016-07-06 DIAGNOSIS — I4891 Unspecified atrial fibrillation: Secondary | ICD-10-CM | POA: Diagnosis not present

## 2016-07-06 DIAGNOSIS — J441 Chronic obstructive pulmonary disease with (acute) exacerbation: Secondary | ICD-10-CM | POA: Diagnosis not present

## 2016-07-07 DIAGNOSIS — I5032 Chronic diastolic (congestive) heart failure: Secondary | ICD-10-CM | POA: Diagnosis not present

## 2016-07-07 DIAGNOSIS — J45902 Unspecified asthma with status asthmaticus: Secondary | ICD-10-CM | POA: Diagnosis not present

## 2016-07-07 DIAGNOSIS — J441 Chronic obstructive pulmonary disease with (acute) exacerbation: Secondary | ICD-10-CM | POA: Diagnosis not present

## 2016-07-07 DIAGNOSIS — M6281 Muscle weakness (generalized): Secondary | ICD-10-CM | POA: Diagnosis not present

## 2016-07-07 DIAGNOSIS — I110 Hypertensive heart disease with heart failure: Secondary | ICD-10-CM | POA: Diagnosis not present

## 2016-07-07 DIAGNOSIS — I4891 Unspecified atrial fibrillation: Secondary | ICD-10-CM | POA: Diagnosis not present

## 2016-07-12 DIAGNOSIS — M6281 Muscle weakness (generalized): Secondary | ICD-10-CM | POA: Diagnosis not present

## 2016-07-12 DIAGNOSIS — I5032 Chronic diastolic (congestive) heart failure: Secondary | ICD-10-CM | POA: Diagnosis not present

## 2016-07-12 DIAGNOSIS — J441 Chronic obstructive pulmonary disease with (acute) exacerbation: Secondary | ICD-10-CM | POA: Diagnosis not present

## 2016-07-12 DIAGNOSIS — I4891 Unspecified atrial fibrillation: Secondary | ICD-10-CM | POA: Diagnosis not present

## 2016-07-12 DIAGNOSIS — I110 Hypertensive heart disease with heart failure: Secondary | ICD-10-CM | POA: Diagnosis not present

## 2016-07-12 DIAGNOSIS — J45902 Unspecified asthma with status asthmaticus: Secondary | ICD-10-CM | POA: Diagnosis not present

## 2016-07-19 DIAGNOSIS — J45902 Unspecified asthma with status asthmaticus: Secondary | ICD-10-CM | POA: Diagnosis not present

## 2016-07-19 DIAGNOSIS — J441 Chronic obstructive pulmonary disease with (acute) exacerbation: Secondary | ICD-10-CM | POA: Diagnosis not present

## 2016-07-19 DIAGNOSIS — I110 Hypertensive heart disease with heart failure: Secondary | ICD-10-CM | POA: Diagnosis not present

## 2016-07-19 DIAGNOSIS — I5032 Chronic diastolic (congestive) heart failure: Secondary | ICD-10-CM | POA: Diagnosis not present

## 2016-07-19 DIAGNOSIS — M6281 Muscle weakness (generalized): Secondary | ICD-10-CM | POA: Diagnosis not present

## 2016-07-19 DIAGNOSIS — I4891 Unspecified atrial fibrillation: Secondary | ICD-10-CM | POA: Diagnosis not present

## 2016-07-22 DIAGNOSIS — Z9981 Dependence on supplemental oxygen: Secondary | ICD-10-CM | POA: Diagnosis not present

## 2016-07-22 DIAGNOSIS — J9612 Chronic respiratory failure with hypercapnia: Secondary | ICD-10-CM | POA: Diagnosis not present

## 2016-07-22 DIAGNOSIS — I482 Chronic atrial fibrillation: Secondary | ICD-10-CM | POA: Diagnosis not present

## 2016-07-22 DIAGNOSIS — J449 Chronic obstructive pulmonary disease, unspecified: Secondary | ICD-10-CM | POA: Diagnosis not present

## 2016-07-22 DIAGNOSIS — G4733 Obstructive sleep apnea (adult) (pediatric): Secondary | ICD-10-CM | POA: Diagnosis not present

## 2016-07-23 ENCOUNTER — Other Ambulatory Visit: Payer: Self-pay | Admitting: Family Medicine

## 2016-07-23 DIAGNOSIS — R609 Edema, unspecified: Secondary | ICD-10-CM

## 2016-07-26 DIAGNOSIS — I5032 Chronic diastolic (congestive) heart failure: Secondary | ICD-10-CM | POA: Diagnosis not present

## 2016-07-26 DIAGNOSIS — I4891 Unspecified atrial fibrillation: Secondary | ICD-10-CM | POA: Diagnosis not present

## 2016-07-26 DIAGNOSIS — J441 Chronic obstructive pulmonary disease with (acute) exacerbation: Secondary | ICD-10-CM | POA: Diagnosis not present

## 2016-07-26 DIAGNOSIS — M6281 Muscle weakness (generalized): Secondary | ICD-10-CM | POA: Diagnosis not present

## 2016-07-26 DIAGNOSIS — J45902 Unspecified asthma with status asthmaticus: Secondary | ICD-10-CM | POA: Diagnosis not present

## 2016-07-26 DIAGNOSIS — I110 Hypertensive heart disease with heart failure: Secondary | ICD-10-CM | POA: Diagnosis not present

## 2016-08-05 DIAGNOSIS — J42 Unspecified chronic bronchitis: Secondary | ICD-10-CM | POA: Diagnosis not present

## 2016-08-05 DIAGNOSIS — I4891 Unspecified atrial fibrillation: Secondary | ICD-10-CM | POA: Diagnosis not present

## 2016-08-05 DIAGNOSIS — I5032 Chronic diastolic (congestive) heart failure: Secondary | ICD-10-CM | POA: Diagnosis not present

## 2016-08-05 DIAGNOSIS — Z6841 Body Mass Index (BMI) 40.0 and over, adult: Secondary | ICD-10-CM | POA: Diagnosis not present

## 2016-08-05 DIAGNOSIS — E876 Hypokalemia: Secondary | ICD-10-CM | POA: Diagnosis not present

## 2016-08-11 ENCOUNTER — Ambulatory Visit (INDEPENDENT_AMBULATORY_CARE_PROVIDER_SITE_OTHER): Payer: Medicare Other

## 2016-08-11 VITALS — BP 128/68 | HR 72 | Temp 98.6°F | Ht 68.0 in | Wt 290.0 lb

## 2016-08-11 DIAGNOSIS — Z0000 Encounter for general adult medical examination without abnormal findings: Secondary | ICD-10-CM | POA: Diagnosis not present

## 2016-08-11 NOTE — Patient Instructions (Signed)
[name] , Thank you for taking time to come for your Medicare Wellness Visit. I appreciate your ongoing commitment to your health goals. Please review the following plan we discussed and let me know if I can assist you in the future.   Screening recommendations/referrals: Colonoscopy: declined Recommended yearly ophthalmology/optometry visit for glaucoma screening and checkup Recommended yearly dental visit for hygiene and checkup  Vaccinations: Influenza vaccine: declined Pneumococcal vaccine: declined Tdap vaccine: declined Shingles vaccine: declined   Advanced directives: requested copy  Next appointment: None, needs to schedule physical with PCP.  Preventive Care 74 Years and Older, Male Preventive care refers to lifestyle choices and visits with your health care provider that can promote health and wellness. What does preventive care include?  A yearly physical exam. This is also called an annual well check.  Dental exams once or twice a year.  Routine eye exams. Ask your health care provider how often you should have your eyes checked.  Personal lifestyle choices, including:  Daily care of your teeth and gums.  Regular physical activity.  Eating a healthy diet.  Avoiding tobacco and drug use.  Limiting alcohol use.  Practicing safe sex.  Taking low doses of aspirin every day.  Taking vitamin and mineral supplements as recommended by your health care provider. What happens during an annual well check? The services and screenings done by your health care provider during your annual well check will depend on your age, overall health, lifestyle risk factors, and family history of disease. Counseling  Your health care provider may ask you questions about your:  Alcohol use.  Tobacco use.  Drug use.  Emotional well-being.  Home and relationship well-being.  Sexual activity.  Eating habits.  History of falls.  Memory and ability to understand  (cognition).  Work and work Astronomer. Screening  You may have the following tests or measurements:  Height, weight, and BMI.  Blood pressure.  Lipid and cholesterol levels. These may be checked every 5 years, or more frequently if you are over 35 years old.  Skin check.  Lung cancer screening. You may have this screening every year starting at age 63 if you have a 30-pack-year history of smoking and currently smoke or have quit within the past 15 years.  Fecal occult blood test (FOBT) of the stool. You may have this test every year starting at age 46.  Flexible sigmoidoscopy or colonoscopy. You may have a sigmoidoscopy every 5 years or a colonoscopy every 10 years starting at age 35.  Prostate cancer screening. Recommendations will vary depending on your family history and other risks.  Hepatitis C blood test.  Hepatitis B blood test.  Sexually transmitted disease (STD) testing.  Diabetes screening. This is done by checking your blood sugar (glucose) after you have not eaten for a while (fasting). You may have this done every 1-3 years.  Abdominal aortic aneurysm (AAA) screening. You may need this if you are a current or former smoker.  Osteoporosis. You may be screened starting at age 65 if you are at high risk. Talk with your health care provider about your test results, treatment options, and if necessary, the need for more tests. Vaccines  Your health care provider may recommend certain vaccines, such as:  Influenza vaccine. This is recommended every year.  Tetanus, diphtheria, and acellular pertussis (Tdap, Td) vaccine. You may need a Td booster every 10 years.  Zoster vaccine. You may need this after age 1.  Pneumococcal 13-valent conjugate (PCV13) vaccine.  One dose is recommended after age 40.  Pneumococcal polysaccharide (PPSV23) vaccine. One dose is recommended after age 31. Talk to your health care provider about which screenings and vaccines you need and  how often you need them. This information is not intended to replace advice given to you by your health care provider. Make sure you discuss any questions you have with your health care provider. Document Released: 05/16/2015 Document Revised: 01/07/2016 Document Reviewed: 02/18/2015 Elsevier Interactive Patient Education  2017 [place] Prevention in the Home Falls can cause injuries. They can happen to people of all ages. There are many things you can do to make your home safe and to help prevent falls. What can I do on the outside of my home?  Regularly fix the edges of walkways and driveways and fix any cracks.  Remove anything that might make you trip as you walk through a door, such as a raised step or threshold.  Trim any bushes or trees on the path to your home.  Use bright outdoor lighting.  Clear any walking paths of anything that might make someone trip, such as rocks or tools.  Regularly check to see if handrails are loose or broken. Make sure that both sides of any steps have handrails.  Any raised decks and porches should have guardrails on the edges.  Have any leaves, snow, or ice cleared regularly.  Use sand or salt on walking paths during winter.  Clean up any spills in your garage right away. This includes oil or grease spills. What can I do in the bathroom?  Use night lights.  Install grab bars by the toilet and in the tub and shower. Do not use towel bars as grab bars.  Use non-skid mats or decals in the tub or shower.  If you need to sit down in the shower, use a plastic, non-slip stool.  Keep the floor dry. Clean up any water that spills on the floor as soon as it happens.  Remove soap buildup in the tub or shower regularly.  Attach bath mats securely with double-sided non-slip rug tape.  Do not have throw rugs and other things on the floor that can make you trip. What can I do in the bedroom?  Use night lights.  Make sure that you  have a light by your bed that is easy to reach.  Do not use any sheets or blankets that are too big for your bed. They should not hang down onto the floor.  Have a firm chair that has side arms. You can use this for support while you get dressed.  Do not have throw rugs and other things on the floor that can make you trip. What can I do in the kitchen?  Clean up any spills right away.  Avoid walking on wet floors.  Keep items that you use a lot in easy-to-reach places.  If you need to reach something above you, use a strong step stool that has a grab bar.  Keep electrical cords out of the way.  Do not use floor polish or wax that makes floors slippery. If you must use wax, use non-skid floor wax.  Do not have throw rugs and other things on the floor that can make you trip. What can I do with my stairs?  Do not leave any items on the stairs.  Make sure that there are handrails on both sides of the stairs and use them. Fix handrails that are broken  or loose. Make sure that handrails are as long as the stairways.  Check any carpeting to make sure that it is firmly attached to the stairs. Fix any carpet that is loose or worn.  Avoid having throw rugs at the top or bottom of the stairs. If you do have throw rugs, attach them to the floor with carpet tape.  Make sure that you have a light switch at the top of the stairs and the bottom of the stairs. If you do not have them, ask someone to add them for you. What else can I do to help prevent falls?  Wear shoes that:  Do not have high heels.  Have rubber bottoms.  Are comfortable and fit you well.  Are closed at the toe. Do not wear sandals.  If you use a stepladder:  Make sure that it is fully opened. Do not climb a closed stepladder.  Make sure that both sides of the stepladder are locked into place.  Ask someone to hold it for you, if possible.  Clearly mark and make sure that you can see:  Any grab bars or  handrails.  First and last steps.  Where the edge of each step is.  Use tools that help you move around (mobility aids) if they are needed. These include:  Canes.  Walkers.  Scooters.  Crutches.  Turn on the lights when you go into a dark area. Replace any light bulbs as soon as they burn out.  Set up your furniture so you have a clear path. Avoid moving your furniture around.  If any of your floors are uneven, fix them.  If there are any pets around you, be aware of where they are.  Review your medicines with your doctor. Some medicines can make you feel dizzy. This can increase your chance of falling. Ask your doctor what other things that you can do to help prevent falls. This information is not intended to replace advice given to you by your health care provider. Make sure you discuss any questions you have with your health care provider. Document Released: 02/13/2009 Document Revised: 09/25/2015 Document Reviewed: 05/24/2014 Elsevier Interactive Patient Education  2017 [employer].

## 2016-08-11 NOTE — Progress Notes (Signed)
Subjective:   [name] is a 66 y.o. male who presents for an Initial Medicare Annual Wellness Visit.  Review of Systems  N/A Cardiac Risk Factors include: advanced age (>15men, >44 women);hypertension;male gender;obesity (BMI >30kg/m2)    Objective:    Today's Vitals   08/11/16 1057  BP: 128/68  Pulse: 72  Temp: 98.6 F (37 C)  TempSrc: Oral  Weight: 290 lb (131.5 kg)  Height:  (1.727 m)   Body mass index is 44.09 kg/m.  Current Medications (verified) Outpatient Encounter Prescriptions as of 08/11/2016  Medication Sig  . ADVAIR DISKUS 500-50 MCG/DOSE AEPB INHALE 1 PUFF TWICE DAILY.  RINSE MOUTH AFTER EACH USE  . albuterol (PROVENTIL HFA;VENTOLIN HFA) 108 (90 BASE) MCG/ACT inhaler Inhale 2 puffs into the lungs 2 (two) times daily.  Marland Kitchen aspirin 81 MG tablet Take 1 tablet by mouth daily.  . COMBIVENT RESPIMAT 20-100 MCG/ACT AERS respimat USE ONE PUFF FOUR TIMES DAILY  . diltiazem (CARDIZEM CD) 240 MG 24 hr capsule Take by mouth.  . furosemide (LASIX) 40 MG tablet TAKE ONE TABLET TWICE DAILY AS NEEDED  . ibuprofen (ADVIL,MOTRIN) 200 MG tablet Take 1 tablet by mouth as needed.  . montelukast (SINGULAIR) 10 MG tablet TAKE ONE TABLET AT BEDTIME  . MULTIPLE VITAMIN PO Take 1 tablet by mouth daily.  . potassium chloride SA (K-DUR,KLOR-CON) 20 MEQ tablet Take 1 tablet by mouth daily. Reported on 10/24/2015  . predniSONE (DELTASONE) 10 MG tablet TAKE ONE (1) TABLET EACH DAY  . ranitidine (ZANTAC) 150 MG tablet TAKE ONE TABLET EVERY 12 HOURS  . vitamin C (ASCORBIC ACID) 500 MG tablet Take by mouth.  . digoxin (LANOXIN) 0.25 MG tablet Take 0.25 mg by mouth daily.  Marland Kitchen guaiFENesin-codeine 100-10 MG/5ML syrup Take 10 mLs by mouth every 4 (four) hours as needed for cough. (Patient not taking: Reported on 08/11/2016)  . nystatin (MYCOSTATIN/NYSTOP) powder Apply topically 2 (two) times daily. (Patient not taking: Reported on 08/11/2016)  . TUDORZA PRESSAIR 400 MCG/ACT AEPB    No  facility-administered encounter medications on file as of 08/11/2016.     Allergies (verified) Promethazine; Aspirin; and Sulfa antibiotics   History: Past Medical History:  Diagnosis Date  . Anxiety   . Asthma   . CHF (congestive heart failure) (HCC)   . COPD (chronic obstructive pulmonary disease) (HCC)   . Dyspnea   . Hypertension    Past Surgical History:  Procedure Laterality Date  . APPENDECTOMY  1974  . CHOLECYSTECTOMY  1974  . NASAL SINUS SURGERY  [phone]  . TONSILLECTOMY AND ADENOIDECTOMY  1970  . VASECTOMY  1979   Family History  Problem Relation Age of Onset  . Heart disease Mother   . Congestive Heart Failure Father    Social History   Occupational History  . Not on file.   Social History Main Topics  . Smoking status: Former Smoker    Types: Cigarettes  . Smokeless tobacco: Never Used     Comment: quit in 2003  . Alcohol use No  . Drug use: No  . Sexual activity: No   Tobacco Counseling Counseling given: Not Answered   Activities of Daily Living In your present state of health, do you have any difficulty performing the following activities: 08/11/2016 05/17/2016  Hearing? N N  Vision? Y N  Difficulty concentrating or making decisions? N N  Walking or climbing stairs? N N  Dressing or bathing? N N  Doing errands, shopping? N N  Preparing Food and eating ? N -  Using the Toilet? N -  In the past six months, have you accidently leaked urine? N -  Do you have problems with loss of bowel control? N -  Managing your Medications? N -  Managing your Finances? N -  Housekeeping or managing your Housekeeping? N -  Some recent data might be hidden    Immunizations and Health Maintenance There is no immunization history for the selected administration types on file for this patient. There are no preventive care reminders to display for this patient.  Patient Care Team: Tamsen Roers, PA as PCP - General (Physician Assistant) Geoffery Lyons, PA as Referring Physician (Physician Assistant) Dalia Heading, MD as Consulting Physician (Cardiology) Yevonne Pax, MD as Consulting Physician (Internal Medicine)  Indicate any recent Medical Services you may have received from other than [facility] providers in the past year (date may be approximate).    Assessment:   This is a routine wellness examination for [place].   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   Dietary issues and exercise activities discussed: Current Exercise Habits: Home exercise routine, Type of exercise: strength training/weights;walking;Other - see comments (steps), Time (Minutes): 15, Frequency (Times/Week): 3, Weekly Exercise (Minutes/Week): 45, Intensity: Mild  Goals    . Increase water intake          Recommend increasing water intake to 4-6 glasses a day.      Depression Screen PHQ 2/9 Scores 08/11/2016  PHQ - 2 Score 1    Fall Risk Fall Risk  08/11/2016  Falls in the past year? No    Cognitive Function:     6CIT Screen 08/11/2016  What Year? 0 points  What month? 0 points  What time? 3 points  Count back from 20 0 points  Months in reverse 0 points  Repeat phrase 0 points  Total Score 3    Screening Tests Health Maintenance  Topic Date Due  . Hepatitis C Screening  08/31/2016 (Originally [birth year])  . HIV Screening  08/31/2016 (Originally [birth year]1966)  . COLONOSCOPY  05/03/2026 (Originally 04/19/2001)  . TETANUS/TDAP  05/03/2026 (Originally 04/19/1970)  . PNA vac Low Risk Adult (1 of 2 - PCV13) 05/03/2026 (Originally 04/19/2016)  . INFLUENZA VACCINE  12/01/2016        Plan:  I have personally reviewed and addressed the Medicare Annual Wellness questionnaire and have noted the following in the patient's chart:  A. Medical and social history B. Use of alcohol, tobacco or illicit drugs  C. Current medications and  supplements D. Functional ability and status E.  Nutritional status F.  Physical activity G. Advance directives H. List of other physicians I.  Hospitalizations, surgeries, and ER visits in previous 12 months J.  Vitals K. Screenings such as hearing and vision if needed, cognitive and depression L. Referrals and appointments - none  In addition, I have reviewed and discussed with patient certain preventive protocols, quality metrics, and best practice recommendations. A written personalized care plan for preventive services as well as general preventive health recommendations were provided to patient.  See attached scanned questionnaire for additional information.   Signed,  Hyacinth Meeker, LPN Nurse Health Advisor   MD Recommendations: Pt declined colonoscopy referral, pneumonia vaccine, tdap, Hep C and HIV screening today. Pt to follow up with PCP on bloodwork at next appointment.    Reviewed documentation  notes of Nurse Health Advisor and agree with recommendations.

## 2016-10-20 ENCOUNTER — Other Ambulatory Visit: Payer: Self-pay | Admitting: Family Medicine

## 2016-10-20 DIAGNOSIS — R609 Edema, unspecified: Secondary | ICD-10-CM

## 2016-10-27 DIAGNOSIS — H353133 Nonexudative age-related macular degeneration, bilateral, advanced atrophic without subfoveal involvement: Secondary | ICD-10-CM | POA: Diagnosis not present

## 2016-12-23 DIAGNOSIS — G4733 Obstructive sleep apnea (adult) (pediatric): Secondary | ICD-10-CM | POA: Diagnosis not present

## 2016-12-23 DIAGNOSIS — J9611 Chronic respiratory failure with hypoxia: Secondary | ICD-10-CM | POA: Diagnosis not present

## 2016-12-23 DIAGNOSIS — J449 Chronic obstructive pulmonary disease, unspecified: Secondary | ICD-10-CM | POA: Diagnosis not present

## 2016-12-23 DIAGNOSIS — I482 Chronic atrial fibrillation: Secondary | ICD-10-CM | POA: Diagnosis not present

## 2016-12-23 DIAGNOSIS — I5042 Chronic combined systolic (congestive) and diastolic (congestive) heart failure: Secondary | ICD-10-CM | POA: Diagnosis not present

## 2016-12-23 DIAGNOSIS — R0602 Shortness of breath: Secondary | ICD-10-CM | POA: Diagnosis not present

## 2016-12-31 ENCOUNTER — Encounter: Payer: Self-pay | Admitting: Family Medicine

## 2016-12-31 ENCOUNTER — Ambulatory Visit (INDEPENDENT_AMBULATORY_CARE_PROVIDER_SITE_OTHER): Payer: Medicare Other | Admitting: Family Medicine

## 2016-12-31 VITALS — BP 128/64 | HR 74 | Temp 98.7°F | Wt 314.0 lb

## 2016-12-31 DIAGNOSIS — I5032 Chronic diastolic (congestive) heart failure: Secondary | ICD-10-CM | POA: Diagnosis not present

## 2016-12-31 DIAGNOSIS — J9611 Chronic respiratory failure with hypoxia: Secondary | ICD-10-CM

## 2016-12-31 DIAGNOSIS — G8929 Other chronic pain: Secondary | ICD-10-CM | POA: Diagnosis not present

## 2016-12-31 DIAGNOSIS — M2550 Pain in unspecified joint: Secondary | ICD-10-CM | POA: Diagnosis not present

## 2016-12-31 NOTE — Progress Notes (Signed)
Patient: [name] Male    DOB: 24-May-1950   66 y.o.   MRN: [id number] Visit Date: 12/31/2016  Today's Provider: Dortha Kern, PA   Chief Complaint  Patient presents with  . Joint Pain   Subjective:    HPI Arthralgias:   Patient is here today with complaints of back, bilateral shoulder, bilateral hip, and bilateral knee pain. He states the pain is gradually worsening the past year. He states the pain is starting to affect his breathing because the pain is so intense when walking. He has been taking Advil for the pain which provides relief but he is taking 800 mg in the morning, 600-800 mg around 3 pm and 800 mg around 11 pm. He is concerned he is taking to much Advil. Patient would like to discuss starting a prescription medication for arthralgias if possible.   Patient Active Problem List   Diagnosis Date Noted  . Influenzal pneumonia 05/17/2016  . A-fib (HCC) 10/03/2014  . Chronic asthmatic bronchitis with acute exacerbation (HCC) 10/03/2014  . Chronic respiratory failure (HCC) 10/03/2014  . CAFL (chronic airflow limitation) (HCC) 10/03/2014  . Cervical osteoarthritis 10/03/2014  . Chronic obstructive pulmonary emphysema (HCC) 10/03/2014  . Fatigue 10/03/2014  . History of atrial fibrillation 10/03/2014  . Obstructive apnea 10/03/2014  . Disturbance of skin sensation 10/03/2014  . Edema, peripheral 10/03/2014  . CCF (congestive cardiac failure) (HCC) 03/26/2014  . Chronic obstructive pulmonary disease (HCC) 09/21/2013  . Carbuncle and furuncle of buttock 12/04/2009  . Elevated WBC count 01/09/2009  . Hypoxemia 12/10/2008  . 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 Accumulation of fluid in tissues 03/31/2006  . Esophagitis, reflux 03/31/2006  . Decreased potassium in the blood 03/31/2006  . Adiposity 03/31/2006  . Primary localized osteoarthrosis, lower leg 03/31/2006  . Apnea, sleep 03/31/2006   Past Medical History:  Diagnosis  Date  . Anxiety   . Asthma   . CHF (congestive heart failure) (HCC)   . COPD (chronic obstructive pulmonary disease) (HCC)   . Dyspnea   . Hypertension    Past Surgical History:  Procedure Laterality Date  . APPENDECTOMY  1974  . CHOLECYSTECTOMY  1974  . NASAL SINUS SURGERY  [phone]  . TONSILLECTOMY AND ADENOIDECTOMY  1970  . VASECTOMY  1979   Family History  Problem Relation Age of Onset  . Heart disease Mother   . Congestive Heart Failure Father    Allergies  Allergen Reactions  . Promethazine Other (See Comments)    Caused pt to become disoriented after receiving 6.25mg  IV. Caused pt to become disoriented after receiving 6.25mg  IV.  Marland Kitchen Aspirin     Other reaction(s): Asthma flare up  . Sulfa Antibiotics      Previous Medications   ADVAIR DISKUS 500-50 MCG/DOSE AEPB    INHALE 1 PUFF TWICE DAILY.  RINSE MOUTH AFTER EACH USE   ALBUTEROL (PROVENTIL HFA;VENTOLIN HFA) 108 (90 BASE) MCG/ACT INHALER    Inhale 2 puffs into the lungs 2 (two) times daily.   ASPIRIN 81 MG TABLET    Take 1 tablet by mouth daily.   COMBIVENT RESPIMAT 20-100 MCG/ACT AERS RESPIMAT    USE ONE PUFF FOUR TIMES DAILY   DIGOXIN (LANOXIN) 0.25 MG TABLET    Take 0.25 mg by mouth daily.   DILTIAZEM (CARDIZEM CD) 240 MG 24 HR CAPSULE    Take by mouth.   FUROSEMIDE (LASIX) 40 MG TABLET    TAKE ONE TABLET TWICE DAILY AS  NEEDED   IBUPROFEN (ADVIL,MOTRIN) 200 MG TABLET    Take 1 tablet by mouth as needed.   MONTELUKAST (SINGULAIR) 10 MG TABLET    TAKE ONE TABLET AT BEDTIME   MULTIPLE VITAMIN PO    Take 1 tablet by mouth daily.   POTASSIUM CHLORIDE (K-DUR,KLOR-CON) 10 MEQ TABLET    Take 1 tablet by mouth daily. Reported on 10/24/2015   PREDNISONE (DELTASONE) 10 MG TABLET    TAKE ONE (1) TABLET EACH DAY   RANITIDINE (ZANTAC) 150 MG TABLET    TAKE ONE TABLET EVERY 12 HOURS   VITAMIN C (ASCORBIC ACID) 500 MG TABLET    Take by mouth.    Review of Systems  Constitutional: Negative.   Respiratory: Negative.     Cardiovascular: Negative.   Musculoskeletal: Positive for arthralgias.    Social History  Substance Use Topics  . Smoking status: Former Smoker    Types: Cigarettes  . Smokeless tobacco: Never Used     Comment: quit in 2003  . Alcohol use No   Objective:   BP 128/64 (BP Location: Right Arm, Patient Position: Sitting, Cuff Size: Large)   Pulse 74   Temp 98.7 F (37.1 C) (Oral)   Wt (!) 314 lb (142.4 kg)   SpO2 96%   BMI 47.74 kg/m   Physical Exam  Constitutional: He is oriented to person, place, and time. He appears well-developed and well-nourished. No distress.  Obesity.  HENT:  Head: Normocephalic and atraumatic.  Right Ear: Hearing normal.  Left Ear: Hearing normal.  Nose: Nose normal.  Eyes: Conjunctivae and lids are normal. Right eye exhibits no discharge. Left eye exhibits no discharge. No scleral icterus.  Neck: Neck supple.  Pulmonary/Chest: No respiratory distress.  Dyspnea on exertion and on oxygen by nasal cannula at 2 LPM.  Abdominal: Soft. Bowel sounds are normal.  Musculoskeletal: Normal range of motion.  Neurological: He is alert and oriented to person, place, and time.  Skin: Skin is intact. No lesion and no rash noted.  Psychiatric: He has a normal mood and affect. His speech is normal and behavior is normal. Thought content normal.      Assessment & Plan:     1. Chronic pain of multiple joints Having pains in his back, shoulders, hips and knees over the past year. Worse with walking and some relief from use of Ibuprofen 600-800 mg TID prn. Will check RA factor, CBC, CRP and CMP. Apply moist heat and may need x-ray or rheumatology referral pending reports. Needs to lose weight, also. - Rheumatoid Factor - CBC with Differential/Platelet - Comprehensive metabolic panel - C-reactive protein  2. Chronic respiratory failure with hypoxia (HCC) Continues to have chronic respiratory failure issues. Was hospitalized May 17, 2016 and placed on a  ventolater again - secondary to influenzal pneumonia this time. Has a history of COPD with asthma. Continues to use Advair 500-50 1 inhalation BID, Ventolin-HFA 2 puffs QID prn, Combivent 20-200 mcg 1 puff QID and Singulair 10 mg hs with oxygen 2 LPM 24 hours a day and CPAP at night. Has Roxanol 100mg /63ml 90 ml that he will use 1 ml by dropper q 4 hours prn severe flare of dyspnea (rarely has to use it at all - Dr. Welton Flakes gave original prescription). Will recheck CBC and CMP.  - CBC with Differential/Platelet - Comprehensive metabolic panel  3. Chronic diastolic congestive heart failure (HCC) Followed by Dr. Lady Gary with history of atrial fib and hypokalemia during hospital stay on 05-17-16. Denies  palpitations. Difficult to assess dyspnea from chronic pulmonary disease versus diastolic CHF. Continues Diltiazem, Furosemide and Digoxin daily. Recheck labs and follow up with cardiologist. - CBC with Differential/Platelet - Comprehensive metabolic panel

## 2017-01-04 ENCOUNTER — Other Ambulatory Visit: Payer: Self-pay | Admitting: Family Medicine

## 2017-01-04 DIAGNOSIS — I5032 Chronic diastolic (congestive) heart failure: Secondary | ICD-10-CM | POA: Diagnosis not present

## 2017-01-04 DIAGNOSIS — R609 Edema, unspecified: Secondary | ICD-10-CM

## 2017-01-04 DIAGNOSIS — M2550 Pain in unspecified joint: Secondary | ICD-10-CM | POA: Diagnosis not present

## 2017-01-04 DIAGNOSIS — G8929 Other chronic pain: Secondary | ICD-10-CM | POA: Diagnosis not present

## 2017-01-04 DIAGNOSIS — J9611 Chronic respiratory failure with hypoxia: Secondary | ICD-10-CM | POA: Diagnosis not present

## 2017-01-05 LAB — CBC WITH DIFFERENTIAL/PLATELET
BASOS ABS: 0.1 10*3/uL (ref 0.0–0.2)
Basos: 0 %
EOS (ABSOLUTE): 0.2 10*3/uL (ref 0.0–0.4)
Eos: 2 %
Hematocrit: 39.6 % (ref 37.5–51.0)
Hemoglobin: 13.2 g/dL (ref 13.0–17.7)
IMMATURE GRANULOCYTES: 0 %
Immature Grans (Abs): 0 10*3/uL (ref 0.0–0.1)
Lymphocytes Absolute: 2.6 10*3/uL (ref 0.7–3.1)
Lymphs: 21 %
MCH: 30.9 pg (ref 26.6–33.0)
MCHC: 33.3 g/dL (ref 31.5–35.7)
MCV: 93 fL (ref 79–97)
MONOS ABS: 0.8 10*3/uL (ref 0.1–0.9)
Monocytes: 6 %
NEUTROS PCT: 71 %
Neutrophils Absolute: 9 10*3/uL — ABNORMAL HIGH (ref 1.4–7.0)
Platelets: 231 10*3/uL (ref 150–379)
RBC: 4.27 x10E6/uL (ref 4.14–5.80)
RDW: 14.8 % (ref 12.3–15.4)
WBC: 12.7 10*3/uL — AB (ref 3.4–10.8)

## 2017-01-05 LAB — COMPREHENSIVE METABOLIC PANEL
A/G RATIO: 1.5 (ref 1.2–2.2)
ALT: 20 IU/L (ref 0–44)
AST: 24 IU/L (ref 0–40)
Albumin: 4.1 g/dL (ref 3.6–4.8)
Alkaline Phosphatase: 82 IU/L (ref 39–117)
BUN/Creatinine Ratio: 18 (ref 10–24)
BUN: 14 mg/dL (ref 8–27)
Bilirubin Total: 0.3 mg/dL (ref 0.0–1.2)
CALCIUM: 9.2 mg/dL (ref 8.6–10.2)
CO2: 28 mmol/L (ref 20–29)
CREATININE: 0.77 mg/dL (ref 0.76–1.27)
Chloride: 98 mmol/L (ref 96–106)
GFR calc Af Amer: 110 mL/min/{1.73_m2} (ref >59)
GFR, EST NON AFRICAN AMERICAN: 95 mL/min/{1.73_m2} (ref >59)
GLUCOSE: 96 mg/dL (ref 65–99)
Globulin, Total: 2.7 g/dL (ref 1.5–4.5)
POTASSIUM: 4.4 mmol/L (ref 3.5–5.2)
Sodium: 143 mmol/L (ref 134–144)
Total Protein: 6.8 g/dL (ref 6.0–8.5)

## 2017-01-05 LAB — C-REACTIVE PROTEIN: CRP: 17 mg/L — ABNORMAL HIGH (ref 0.0–4.9)

## 2017-01-05 LAB — RHEUMATOID FACTOR: Rhuematoid fact SerPl-aCnc: 42.7 IU/mL — ABNORMAL HIGH (ref 0.0–13.9)

## 2017-01-06 ENCOUNTER — Telehealth: Payer: Self-pay

## 2017-01-06 DIAGNOSIS — G8929 Other chronic pain: Secondary | ICD-10-CM

## 2017-01-06 DIAGNOSIS — M2550 Pain in unspecified joint: Principal | ICD-10-CM

## 2017-01-06 NOTE — Telephone Encounter (Signed)
-----   Message from Tamsen Roersennis E Chrismon, [place] sent at 01/06/2017  7:56 AM EDT ----- Rheumatoid test and CRP test elevated. Recommend referral to a rheumatologist for chronic joint pains.

## 2017-01-06 NOTE — Telephone Encounter (Signed)
Pt advised.  He agreed to proceed with the referral.    Thanks,   -Laura  

## 2017-01-12 DIAGNOSIS — R768 Other specified abnormal immunological findings in serum: Secondary | ICD-10-CM | POA: Diagnosis not present

## 2017-01-12 DIAGNOSIS — J42 Unspecified chronic bronchitis: Secondary | ICD-10-CM | POA: Diagnosis not present

## 2017-01-12 DIAGNOSIS — M160 Bilateral primary osteoarthritis of hip: Secondary | ICD-10-CM | POA: Diagnosis not present

## 2017-01-12 DIAGNOSIS — M5136 Other intervertebral disc degeneration, lumbar region: Secondary | ICD-10-CM | POA: Diagnosis not present

## 2017-01-12 DIAGNOSIS — M179 Osteoarthritis of knee, unspecified: Secondary | ICD-10-CM | POA: Diagnosis not present

## 2017-01-12 DIAGNOSIS — Z7952 Long term (current) use of systemic steroids: Secondary | ICD-10-CM | POA: Diagnosis not present

## 2017-01-12 DIAGNOSIS — M2550 Pain in unspecified joint: Secondary | ICD-10-CM | POA: Insufficient documentation

## 2017-01-12 DIAGNOSIS — M810 Age-related osteoporosis without current pathological fracture: Secondary | ICD-10-CM | POA: Diagnosis not present

## 2017-01-12 DIAGNOSIS — I5032 Chronic diastolic (congestive) heart failure: Secondary | ICD-10-CM | POA: Diagnosis not present

## 2017-01-12 DIAGNOSIS — Z13820 Encounter for screening for osteoporosis: Secondary | ICD-10-CM | POA: Diagnosis not present

## 2017-01-19 DIAGNOSIS — R0602 Shortness of breath: Secondary | ICD-10-CM | POA: Diagnosis not present

## 2017-01-19 LAB — PULMONARY FUNCTION TEST

## 2017-02-08 IMAGING — DX DG CHEST 1V
2 series · 2 of 2 positions shown · non-contrast
Comparison: 05/19/2016

CLINICAL DATA: Dyspnea

EXAM:
CHEST 1 VIEW

[chest ap (1 of 2)]
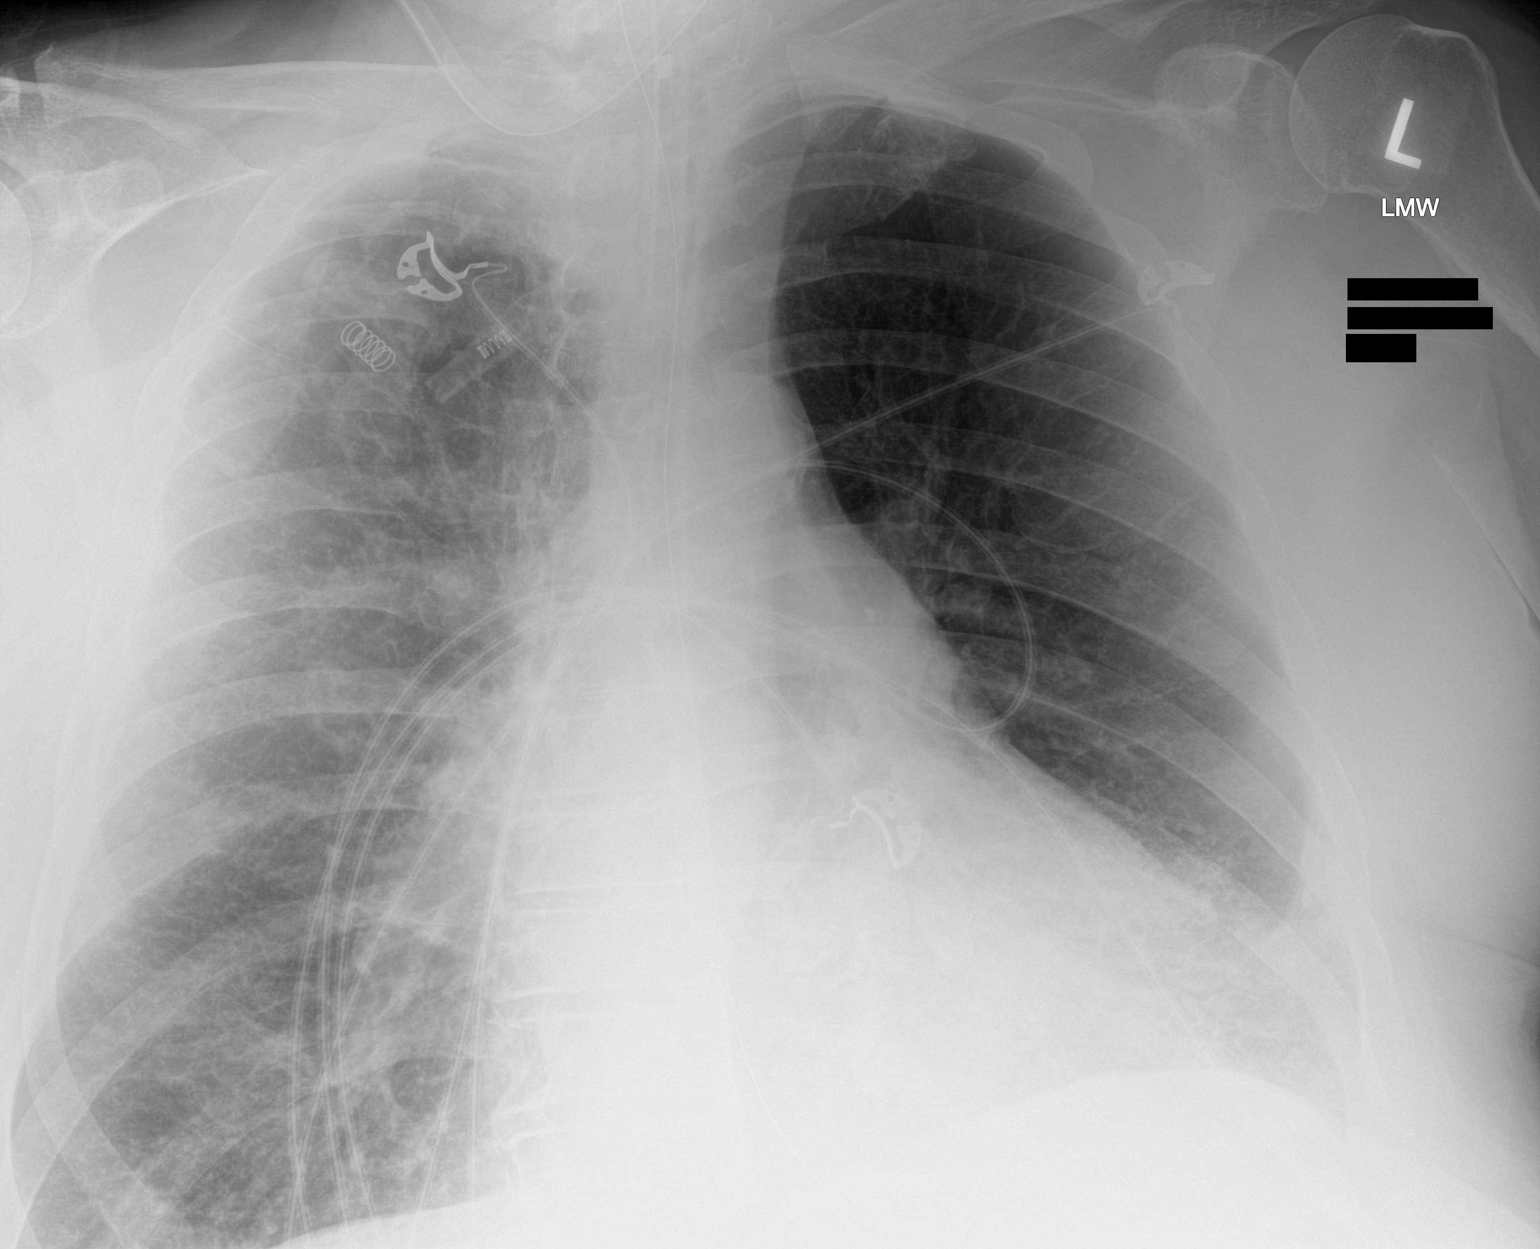

[chest ap (2 of 2)]
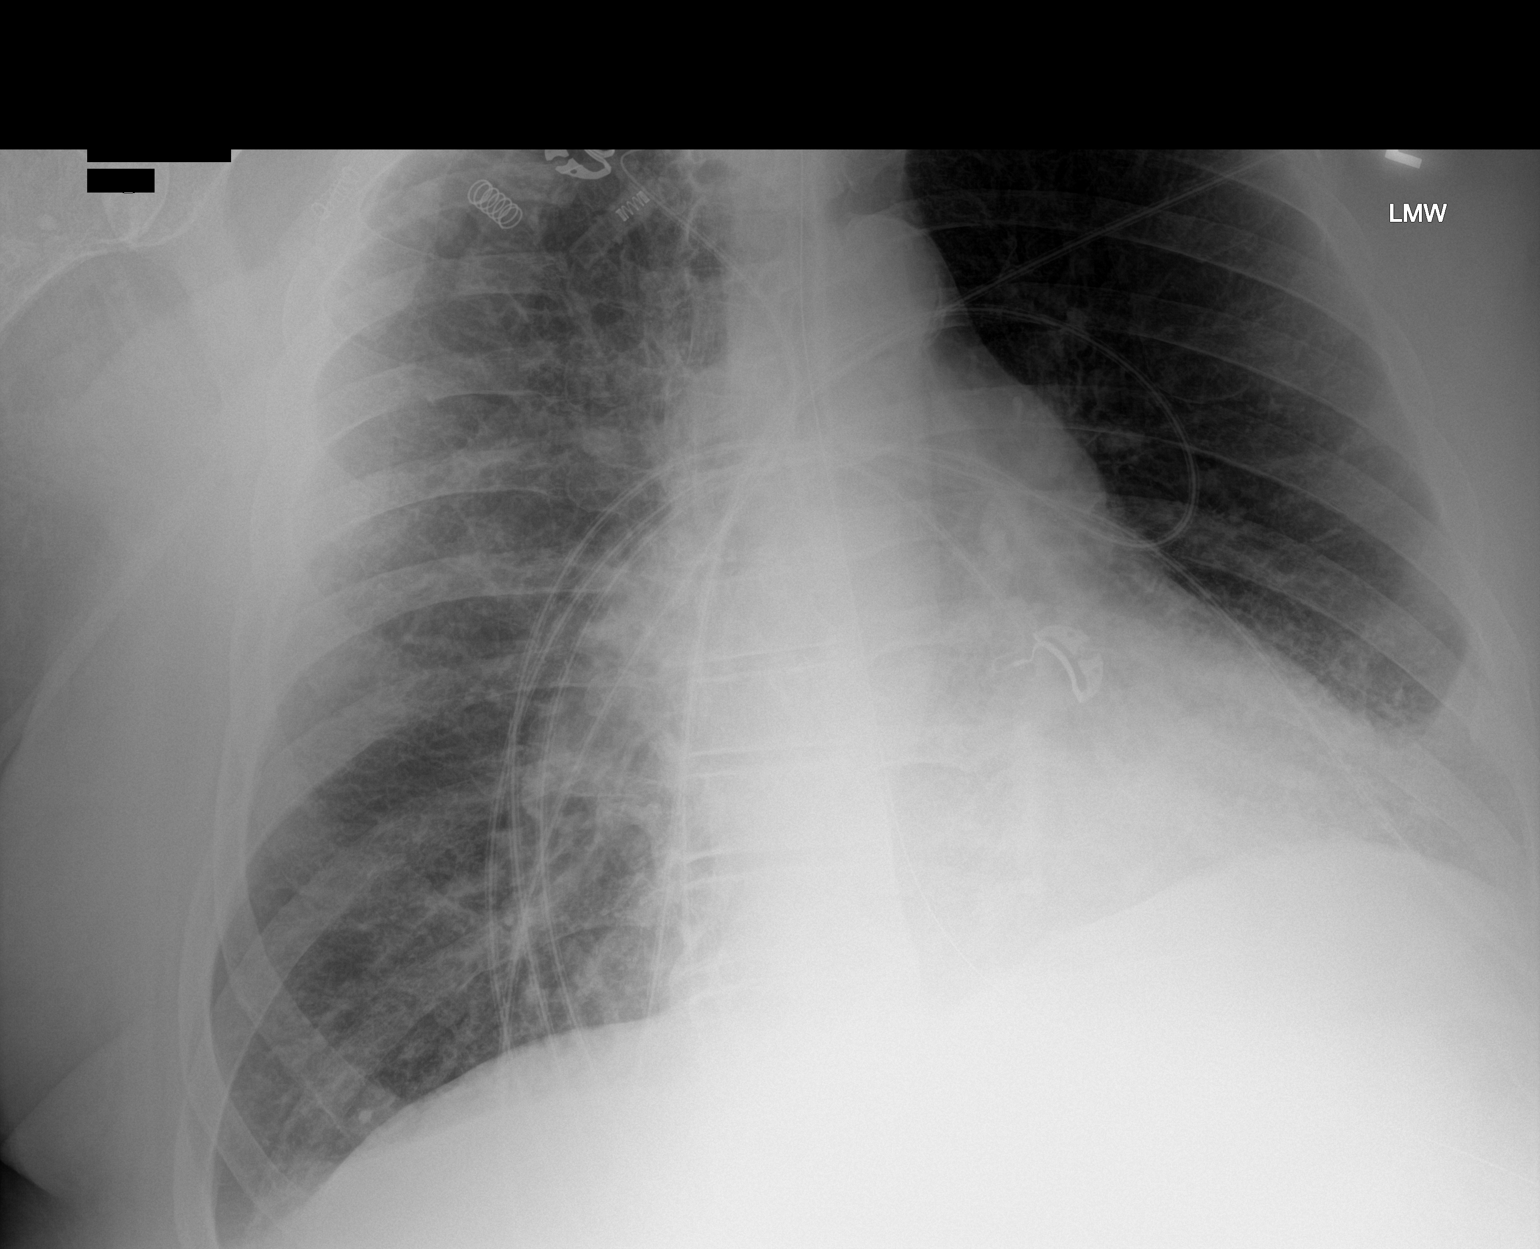

[2 of 2 positions shown; findings below may reference images not displayed]

FINDINGS: COPD with emphysema. Asymmetric airspace disease on the right
slightly improved. Left lower lobe airspace disease also improved.
No significant effusion.

Endotracheal tube in good position.  NG tube in the stomach.
IMPRESSION: COPD. Improving bilateral airspace disease, more prominent on the
right than the left. Possible clearing edema or pneumonia.

## 2017-02-14 DIAGNOSIS — I4891 Unspecified atrial fibrillation: Secondary | ICD-10-CM | POA: Diagnosis not present

## 2017-02-14 DIAGNOSIS — M2550 Pain in unspecified joint: Secondary | ICD-10-CM | POA: Diagnosis not present

## 2017-02-14 DIAGNOSIS — R768 Other specified abnormal immunological findings in serum: Secondary | ICD-10-CM | POA: Diagnosis not present

## 2017-02-14 DIAGNOSIS — E876 Hypokalemia: Secondary | ICD-10-CM | POA: Diagnosis not present

## 2017-02-14 DIAGNOSIS — J42 Unspecified chronic bronchitis: Secondary | ICD-10-CM | POA: Diagnosis not present

## 2017-02-14 DIAGNOSIS — I5032 Chronic diastolic (congestive) heart failure: Secondary | ICD-10-CM | POA: Diagnosis not present

## 2017-02-15 DIAGNOSIS — J069 Acute upper respiratory infection, unspecified: Secondary | ICD-10-CM | POA: Diagnosis not present

## 2017-02-15 DIAGNOSIS — J449 Chronic obstructive pulmonary disease, unspecified: Secondary | ICD-10-CM | POA: Diagnosis not present

## 2017-02-15 DIAGNOSIS — J9611 Chronic respiratory failure with hypoxia: Secondary | ICD-10-CM | POA: Diagnosis not present

## 2017-02-16 DIAGNOSIS — R0602 Shortness of breath: Secondary | ICD-10-CM | POA: Diagnosis not present

## 2017-03-15 ENCOUNTER — Other Ambulatory Visit: Payer: Self-pay | Admitting: Family Medicine

## 2017-03-28 DIAGNOSIS — E559 Vitamin D deficiency, unspecified: Secondary | ICD-10-CM | POA: Diagnosis not present

## 2017-03-28 DIAGNOSIS — M170 Bilateral primary osteoarthritis of knee: Secondary | ICD-10-CM | POA: Diagnosis not present

## 2017-03-28 DIAGNOSIS — M5136 Other intervertebral disc degeneration, lumbar region: Secondary | ICD-10-CM | POA: Diagnosis not present

## 2017-03-28 DIAGNOSIS — Z7952 Long term (current) use of systemic steroids: Secondary | ICD-10-CM | POA: Diagnosis not present

## 2017-03-28 DIAGNOSIS — R768 Other specified abnormal immunological findings in serum: Secondary | ICD-10-CM | POA: Diagnosis not present

## 2017-04-08 ENCOUNTER — Other Ambulatory Visit: Payer: Self-pay | Admitting: Family Medicine

## 2017-04-08 DIAGNOSIS — J9611 Chronic respiratory failure with hypoxia: Secondary | ICD-10-CM

## 2017-04-19 DIAGNOSIS — E559 Vitamin D deficiency, unspecified: Secondary | ICD-10-CM | POA: Diagnosis not present

## 2017-05-02 DIAGNOSIS — H353133 Nonexudative age-related macular degeneration, bilateral, advanced atrophic without subfoveal involvement: Secondary | ICD-10-CM | POA: Diagnosis not present

## 2017-05-12 ENCOUNTER — Ambulatory Visit (INDEPENDENT_AMBULATORY_CARE_PROVIDER_SITE_OTHER): Payer: Medicare Other | Admitting: Internal Medicine

## 2017-05-12 ENCOUNTER — Encounter: Payer: Self-pay | Admitting: Internal Medicine

## 2017-05-12 VITALS — BP 154/78 | HR 71 | Ht 69.0 in | Wt 308.0 lb

## 2017-05-12 DIAGNOSIS — I482 Chronic atrial fibrillation, unspecified: Secondary | ICD-10-CM

## 2017-05-12 DIAGNOSIS — J449 Chronic obstructive pulmonary disease, unspecified: Secondary | ICD-10-CM | POA: Diagnosis not present

## 2017-05-12 DIAGNOSIS — K219 Gastro-esophageal reflux disease without esophagitis: Secondary | ICD-10-CM | POA: Diagnosis not present

## 2017-05-12 DIAGNOSIS — J9611 Chronic respiratory failure with hypoxia: Secondary | ICD-10-CM | POA: Diagnosis not present

## 2017-05-12 DIAGNOSIS — G4733 Obstructive sleep apnea (adult) (pediatric): Secondary | ICD-10-CM

## 2017-05-12 DIAGNOSIS — J069 Acute upper respiratory infection, unspecified: Secondary | ICD-10-CM | POA: Insufficient documentation

## 2017-05-12 MED ORDER — OSELTAMIVIR PHOSPHATE 75 MG PO CAPS: 75.0000 mg | ORAL_CAPSULE | Freq: Once | ORAL | Status: DC

## 2017-05-12 MED ORDER — PREDNISONE 10 MG PO TABS: ORAL_TABLET | ORAL | 3 refills | Status: DC

## 2017-05-12 MED ORDER — LEVOFLOXACIN 500 MG PO TABS: 500.0000 mg | ORAL_TABLET | Freq: Every day | ORAL | 0 refills | Status: DC

## 2017-05-12 NOTE — Patient Instructions (Signed)

## 2017-05-12 NOTE — Progress Notes (Signed)
[facility] [address] [place], [place] [address]  Pulmonary Sleep Medicine  Office Visit Note  Patient Name: [name] DOB: [birth year] MRN [id number]  Date of Service: 05/12/2017     Complaints/HPI:   No issues were noted since the last visit.  Still has very bad shortness of breath.  Patient gets short of breath on minimal exertion cough and congestion noted.  He continues to use his oxygen.  States he is using his BiPAP at nighttime with good response.  No recent admissions to the hospital.  Denies having any chest pain no congestion at this time.  Had prescription for antibiotic and care in case he does get ill also for Tamiflu.  Current Medication: Outpatient Encounter Medications as of 05/12/2017  Medication Sig Note  . ADVAIR DISKUS 500-50 MCG/DOSE AEPB INHALE 1 PUFF TWICE DAILY. RINSE MOUTH AFTER EACH USE   . albuterol (PROVENTIL HFA;VENTOLIN HFA) 108 (90 BASE) MCG/ACT inhaler Inhale 2 puffs into the lungs 2 (two) times daily.   Marland Kitchen aspirin 81 MG tablet Take 1 tablet by mouth daily.   . COMBIVENT RESPIMAT 20-100 MCG/ACT AERS respimat USE ONE PUFF FOUR TIMES DAILY   . diltiazem (CARDIZEM CD) 240 MG 24 hr capsule Take by mouth.   . furosemide (LASIX) 40 MG tablet TAKE ONE TABLET TWICE DAILY AS NEEDED   . ibuprofen (ADVIL,MOTRIN) 200 MG tablet Take 1 tablet by mouth as needed.   . montelukast (SINGULAIR) 10 MG tablet TAKE ONE TABLET AT BEDTIME   . MULTIPLE VITAMIN PO Take 1 tablet by mouth daily.   . Multiple Vitamins-Minerals (ICAPS AREDS 2) CAPS Take 2 each by mouth every 12 (twelve) hours.   . potassium chloride (K-DUR,KLOR-CON) 10 MEQ tablet Take 1 tablet by mouth daily. Reported on 10/24/2015   . predniSONE (DELTASONE) 10 MG tablet TAKE ONE (1) TABLET EACH DAY   . ranitidine (ZANTAC) 150 MG tablet TAKE ONE TABLET EVERY 12 HOURS   . digoxin (LANOXIN) 0.25 MG tablet Take 0.25 mg by mouth daily. 12/31/2016: Not taking   . vitamin C (ASCORBIC ACID) 500 MG  tablet Take by mouth. 12/31/2016: Not taking    No facility-administered encounter medications on file as of 05/12/2017.     Surgical History: Past Surgical History:  Procedure Laterality Date  . APPENDECTOMY  1974  . CHOLECYSTECTOMY  1974  . NASAL SINUS SURGERY  [phone]  . TONSILLECTOMY AND ADENOIDECTOMY  1970  . VASECTOMY  1979    Medical History: Past Medical History:  Diagnosis Date  . Anxiety   . Asthma   . CHF (congestive heart failure) (HCC)   . COPD (chronic obstructive pulmonary disease) (HCC)   . Dyspnea   . Hypertension     Family History: Family History  Problem Relation Age of Onset  . Heart disease Mother   . Congestive Heart Failure Father     Social History: Social History   Socioeconomic History  . Marital status: Married    Spouse name: Not on file  . Number of children: Not on file  . Years of education: Not on file  . Highest education level: Not on file  Social Needs  . Financial resource strain: Not on file  . Food insecurity - worry: Not on file  . Food insecurity - inability: Not on file  . Transportation needs - medical: Not on file  . Transportation needs - non-medical: Not on file  Occupational History  . Not on file  Tobacco Use  .  Smoking status: Former Smoker    Types: Cigarettes  . Smokeless tobacco: Never Used  . Tobacco comment: quit in 2003  Substance and Sexual Activity  . Alcohol use: No    Alcohol/week: 0.0 oz  . Drug use: No  . Sexual activity: No    Birth control/protection: None  Other Topics Concern  . Not on file  Social History Narrative  . Not on file     ROS  General: (-) fever, (-) chills, (-) night sweats, (-) weakness, (-) changes in appetite. Skin: (-) rashes, (-) itching,. Eyes: (-) visual changes, (-) redness, (-) itching, (-) double or blurred vision. Nose and Sinuses: (-) nasal stuffiness or itchiness, (-) postnasal drip, (-) nosebleeds, (-) sinus trouble. Mouth and Throat: (-) sore  throat, (-) hoarseness. Neck: (-) swollen glands, (-) enlarged thyroid, (-) neck pain. Respiratory: (-) cough, (-) bloody sputum, (-) shortness of breath, (-) wheezing. Cardiovascular: (-) ankle swelling, (-) chest pain. Lymphatic: (-) lymph node enlargement, (-) lymph node tenderness. Neurologic: (-) numbness, (-) tingling,(-) dizziness. Psychiatric: (-) anxiety, (-) depression.  Vital Signs: Blood pressure (!) 154/78, pulse 71, height 5\' 9"  (1.753 m), weight (!) 308 lb (139.7 kg), SpO2 97 %.  Examination: General Appearance: The patient is well-developed, well-nourished, and in no distress. Skin: Gross inspection of skin demonstrates no evidence of abnormality. Head: Patient's head is normocephalic, no gross deformities. Eyes: no gross deformities noted. ENT: ears appear grossly normal. Nasopharynx appears to be normal. Neck: Supple. No thyromegaly. No LAD. Respiratory: Lungs are clear to auscultation with no adventitious sounds. Cardiovascular: Normal S1 and S2 without murmur or rub. Extremities: No cyanosis. pulses are equal. Neurologic: Alert and oriented. No involuntary movements.  LABS: No results found for this or any previous visit (from the past 2160 hour(s)).  Radiology: Dg Abdomen 1 View  Result Date: 05/17/2016 CLINICAL DATA:  OG tube placement EXAM: ABDOMEN - 1 VIEW COMPARISON:  CT abdomen 11/07/2015 FINDINGS: Nasogastric tube with the tip projecting of the stomach, but recommend advancing the tube 8 cm. There is no bowel dilatation to suggest obstruction. There is no evidence of pneumoperitoneum, portal venous gas or pneumatosis. There are no pathologic calcifications along the expected course of the ureters. The osseous structures are unremarkable. IMPRESSION: Nasogastric tube with the tip projecting of the stomach, but recommend advancing the tube 8 cm. Electronically Signed   By: Elige Ko   On: 05/17/2016 13:23   Ct Angio Chest Pe W And/or Wo Contrast  Result  Date: 05/17/2016 CLINICAL DATA:  Respiratory failure.  COPD EXAM: CT ANGIOGRAPHY CHEST WITH CONTRAST TECHNIQUE: Multidetector CT imaging of the chest was performed using the standard protocol during bolus administration of intravenous contrast. Multiplanar CT image reconstructions and MIPs were obtained to evaluate the vascular anatomy. CONTRAST:  75 cc Isovue 370 intravenous COMPARISON:  09/30/2015 FINDINGS: Cardiovascular: Satisfactory opacification of the pulmonary arteries to the segmental level. When accounting for areas of artifact no identified pulmonary embolism. Normal heart size. No pericardial effusion. Dilated pulmonary arterial tree with main pulmonary artery diameter of 39 mm, compatible pulmonary hypertension. This may be cor pulmonale. Mediastinum/Nodes: Negative for mass or adenopathy. Endotracheal and orogastric tubes are in good position. Lungs/Pleura: There is a background of centrilobular and panlobular emphysema with generalized airway thickening and mucoid impaction. Patchy consolidative airspace opacities in all lobes, most confluent in the inferior right upper lobe. These do not have a craniocaudal gradient to suggest aspiration. Hyperinflation. No effusion or cavitation. Upper Abdomen: Bilateral renal lesions  as characterized on abdominal CT July 2017. Musculoskeletal: No acute or aggressive finding. 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 Review of the MIP images confirms the above findings. IMPRESSION: 1. Multi lobar pneumonia superimposed on COPD. 2. No evidence of pulmonary embolism. 3. Pulmonary hypertension. Electronically Signed   By: Marnee Spring M.D.   On: 05/17/2016 14:23   Dg Chest Port 1 View  Result Date: 05/18/2016 CLINICAL DATA:  Shortness of breath. EXAM: PORTABLE CHEST 1 VIEW COMPARISON:  CT 05/17/2016 .  Chest x-ray 05/17/2016. FINDINGS: Endotracheal tube and NG tube in stable position. Cardiomegaly. No  pulmonary venous congestion. Multifocal bilateral infiltrates again noted. Interim progression from prior exam. IMPRESSION: 1. Lines and tubes stable position. 2. Multifocal bilateral pulmonary infiltrates. Interim progression from prior exam. 3. Stable cardiomegaly . Electronically Signed   By: Maisie Fus  Register   On: 05/18/2016 08:07   Dg Chest Portable 1 View  Result Date: 05/17/2016 CLINICAL DATA:  Unresponsive. EXAM: PORTABLE CHEST 1 VIEW COMPARISON:  CT 09/30/2015.  Chest x-ray 09/22/2015. FINDINGS: Endotracheal tube noted with its tip 5 cm above the carina. Cardiomegaly with normal pulmonary vascularity. Bibasilar atelectasis. Bibasilar pulmonary infiltrates cannot be excluded. Wedge-shaped peripheral density in the right mid lung. This could represent a focal infiltrate. Infarct cannot be excluded . Contrast-enhanced CT of the chest should be considered for further evaluation in order to exclude pulmonary embolus. No pleural effusion or pneumothorax. IMPRESSION: 1. Endotracheal tube noted in good anatomic position. 2. Low lung volumes with bibasilar atelectasis. Bibasilar infiltrates cannot be excluded. 3. Right mid lung peripheral wedge-shaped density. This could represent a focal infiltrate. Infarct cannot be excluded. Contrast-enhanced CT of the chest should be considered to exclude pulmonary embolus. Electronically Signed   By: Maisie Fus  Register   On: 05/17/2016 12:38    No results found.  No results found.    Assessment and Plan: Patient Active Problem List   Diagnosis Date Noted  . Acute upper respiratory infection, unspecified 05/12/2017  . Influenzal pneumonia 05/17/2016  . A-fib (HCC) 10/03/2014  . Chronic asthmatic bronchitis with acute exacerbation (HCC) 10/03/2014  . Chronic respiratory failure (HCC) 10/03/2014  . CAFL (chronic airflow limitation) (HCC) 10/03/2014  . Cervical osteoarthritis 10/03/2014  . Chronic obstructive pulmonary emphysema (HCC) 10/03/2014  . Fatigue  10/03/2014  . History of atrial fibrillation 10/03/2014  . Obstructive apnea 10/03/2014  . Disturbance of skin sensation 10/03/2014  . Edema, peripheral 10/03/2014  . CCF (congestive cardiac failure) (HCC) 03/26/2014  . Chronic obstructive pulmonary disease (HCC) 09/21/2013  . Carbuncle and furuncle of buttock 12/04/2009  . Elevated WBC count 01/09/2009  . Hypoxemia 12/10/2008  . 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 Accumulation of fluid in tissues 03/31/2006  . Esophagitis, reflux 03/31/2006  . Decreased potassium in the blood 03/31/2006  . Adiposity 03/31/2006  . Primary localized osteoarthrosis, lower leg 03/31/2006  . Apnea, sleep 03/31/2006    1. COPD   Patient has severe disease right now is oxygen dependent Continue with full supportive care Follow up pulmonary functions as per his regular schedule  2. OSA  good compliance with BiPAP will be continued on the present pressure settings  is using and also getting good benefit from his BiPAP  3. GERD  well controlled at this time  4. Chronic respiratory failure with hypoxia  oxygen therapy We will continue with oxygen at the current flow rate seems to be oxygen  5. Chronic Atrial Fibrillation  the rate is well controlled at this time this will be continued be monitored by his primary   General Counseling: I have discussed the findings of the evaluation and examination with [name].  I have also discussed any further diagnostic evaluation thatmay be needed or ordered today. [name] verbalizes understanding of the findings of todays visit. We also reviewed his medications today and discussed drug interactions and side effects including but not limited excessive drowsiness and altered mental states. We also discussed that there is always a risk not just to him but also people around him. he has been encouraged to call the office with any questions or concerns that should arise related to todays  visit.    Time spent: 25min  I have personally obtained a history, examined the patient, evaluated laboratory and imaging results, formulated the assessment and plan and placed orders.    Yevonne PaxSaadat A Mackinley Kiehn, MD [facility] Pulmonary and Critical Care Sleep medicine

## 2017-07-22 ENCOUNTER — Telehealth: Payer: Self-pay

## 2017-07-22 NOTE — Telephone Encounter (Signed)
Adult and Pediatric Specialist called to fax pt last visit note that included oxygen levels and note was faxed today 07/22/17 at number [phone].

## 2017-07-29 ENCOUNTER — Telehealth: Payer: Self-pay

## 2017-07-29 NOTE — Telephone Encounter (Signed)
CMN is ready and put in folder. 

## 2017-08-24 DIAGNOSIS — I5032 Chronic diastolic (congestive) heart failure: Secondary | ICD-10-CM | POA: Diagnosis not present

## 2017-08-24 DIAGNOSIS — J42 Unspecified chronic bronchitis: Secondary | ICD-10-CM | POA: Diagnosis not present

## 2017-08-24 DIAGNOSIS — Z6841 Body Mass Index (BMI) 40.0 and over, adult: Secondary | ICD-10-CM | POA: Diagnosis not present

## 2017-08-24 DIAGNOSIS — I4891 Unspecified atrial fibrillation: Secondary | ICD-10-CM | POA: Diagnosis not present

## 2017-08-24 DIAGNOSIS — I480 Paroxysmal atrial fibrillation: Secondary | ICD-10-CM | POA: Diagnosis not present

## 2017-08-31 ENCOUNTER — Other Ambulatory Visit: Payer: Self-pay | Admitting: Internal Medicine

## 2017-08-31 DIAGNOSIS — J9611 Chronic respiratory failure with hypoxia: Secondary | ICD-10-CM

## 2017-09-05 ENCOUNTER — Ambulatory Visit: Payer: Self-pay

## 2017-09-12 ENCOUNTER — Encounter: Payer: Self-pay | Admitting: Family Medicine

## 2017-09-12 ENCOUNTER — Ambulatory Visit (INDEPENDENT_AMBULATORY_CARE_PROVIDER_SITE_OTHER): Payer: Medicare Other

## 2017-09-12 ENCOUNTER — Ambulatory Visit (INDEPENDENT_AMBULATORY_CARE_PROVIDER_SITE_OTHER): Payer: Medicare Other | Admitting: Family Medicine

## 2017-09-12 VITALS — BP 134/60 | HR 92 | Temp 99.1°F

## 2017-09-12 VITALS — BP 134/60 | HR 92 | Temp 99.1°F | Ht 69.0 in | Wt 333.0 lb

## 2017-09-12 DIAGNOSIS — J9611 Chronic respiratory failure with hypoxia: Secondary | ICD-10-CM

## 2017-09-12 DIAGNOSIS — Z0000 Encounter for general adult medical examination without abnormal findings: Secondary | ICD-10-CM

## 2017-09-12 DIAGNOSIS — G4733 Obstructive sleep apnea (adult) (pediatric): Secondary | ICD-10-CM | POA: Diagnosis not present

## 2017-09-12 DIAGNOSIS — I482 Chronic atrial fibrillation, unspecified: Secondary | ICD-10-CM

## 2017-09-12 DIAGNOSIS — I10 Essential (primary) hypertension: Secondary | ICD-10-CM

## 2017-09-12 DIAGNOSIS — J432 Centrilobular emphysema: Secondary | ICD-10-CM | POA: Diagnosis not present

## 2017-09-12 NOTE — Progress Notes (Addendum)
Subjective:   [name] is a 67 y.o. male who presents for Medicare Annual/Subsequent preventive examination.  Review of Systems:  N/A  Cardiac Risk Factors include: advanced age (>30men, >34 women);male gender;obesity (BMI >30kg/m2)     Objective:    Vitals: BP 134/60 (BP Location: Left Arm)   Pulse 92   Temp 99.1 F (37.3 C) (Oral)   Ht  (1.753 m)   Wt (!) 333 lb (151 kg) Comment: per pt (AM weight)  BMI 49.18 kg/m   Body mass index is 49.18 kg/m.  Advanced Directives 09/12/2017 08/11/2016 05/17/2016 11/01/2014  Does Patient Have a Medical Advance Directive? Yes Yes Yes No  Type of Estate agent of [place];Living will Living will;Healthcare Power of [employer] Power of Attorney -  Does patient want to make changes to medical advance directive? - - No - Patient declined -  Copy of Healthcare Power of Attorney in Chart? No - copy requested No - copy requested No - copy requested -  Would patient like information on creating a medical advance directive? - - - No - patient declined information    Tobacco Social History   Tobacco Use  Smoking Status Former Smoker  . Types: Cigarettes  Smokeless Tobacco Never Used  Tobacco Comment   quit in 2003     Counseling given: Not Answered Comment: quit in 2003   Clinical Intake:  Pre-visit preparation completed: Yes  Pain : 0-10 Pain Score: 5  Pain Type: Chronic pain Pain Location: (all over pain - no specific area)     Nutritional Status: BMI > 30  Obese Nutritional Risks: None Diabetes: No  How often do you need to have someone help you when you read instructions, pamphlets, or other written materials from your doctor or pharmacy?: 3 - Sometimes  Interpreter Needed?: No  Information entered by :: [facility], LPN  Past Medical History:  Diagnosis Date  . Anxiety   . Arthritis    in back and knees  . Asthma   . CHF (congestive heart failure) (HCC)   . COPD (chronic  obstructive pulmonary disease) (HCC)   . Dyspnea   . Hypertension   . Obesity    Past Surgical History:  Procedure Laterality Date  . APPENDECTOMY  1974  . CHOLECYSTECTOMY  1974  . NASAL SINUS SURGERY  [phone]  . TONSILLECTOMY AND ADENOIDECTOMY  1970  . VASECTOMY  1979   Family History  Problem Relation Age of Onset  . Heart disease Mother   . Congestive Heart Failure Father    Social History   Socioeconomic History  . Marital status: Married    Spouse name: Not on file  . Number of children: Not on file  . Years of education: Not on file  . Highest education level: Some college, no degree  Occupational History  . Occupation: retired  . Occupation: disabled  Social Needs  . Financial resource strain: Not hard at all  . Food insecurity:    Worry: Never true    Inability: Never true  . Transportation needs:    Medical: No    Non-medical: No  Tobacco Use  . Smoking status: Former Smoker    Types: Cigarettes  . Smokeless tobacco: Never Used  . Tobacco comment: quit in 2003  Substance and Sexual Activity  . Alcohol use: No    Alcohol/week: 0.0 oz  . Drug use: No  . Sexual activity: Never    Birth control/protection: None  Lifestyle  .  Physical activity:    Days per week: Not on file    Minutes per session: Not on file  . Stress: Not at all  Relationships  . Social connections:    Talks on phone: Not on file    Gets together: Not on file    Attends religious service: Not on file    Active member of club or organization: Not on file    Attends meetings of clubs or organizations: Not on file    Relationship status: Not on file  Other Topics Concern  . Not on file  Social History Narrative  . Not on file    Outpatient Encounter Medications as of 09/12/2017  Medication Sig  . ADVAIR DISKUS 500-50 MCG/DOSE AEPB INHALE 1 PUFF TWICE DAILY. RINSE MOUTH AFTER EACH USE  . COMBIVENT RESPIMAT 20-100 MCG/ACT AERS respimat USE ONE PUFF FOUR TIMES DAILY  .  diltiazem (CARDIZEM CD) 300 MG 24 hr capsule Take 300 mg by mouth daily.   . furosemide (LASIX) 40 MG tablet TAKE ONE TABLET TWICE DAILY AS NEEDED  . ibuprofen (ADVIL,MOTRIN) 200 MG tablet Take 1 tablet by mouth as needed.  Marland Kitchen ipratropium-albuterol (DUONEB) 0.5-2.5 (3) MG/3ML SOLN Take 3 mLs by nebulization every 4 (four) hours as needed.  . montelukast (SINGULAIR) 10 MG tablet TAKE ONE TABLET AT BEDTIME  . morphine (ROXANOL) 20 MG/ML concentrated solution Take 20 mg by mouth every 4 (four) hours as needed for shortness of breath.   . MULTIPLE VITAMIN PO Take 1 tablet by mouth daily.  . Multiple Vitamins-Minerals (ICAPS AREDS 2) CAPS Take 2 each by mouth every 12 (twelve) hours.  . potassium chloride (K-DUR,KLOR-CON) 10 MEQ tablet Take 1 tablet by mouth daily. Reported on 10/24/2015  . predniSONE (DELTASONE) 10 MG tablet TAKE ONE (1) TABLET EACH DAY  . ranitidine (ZANTAC) 150 MG tablet TAKE ONE TABLET EVERY 12 HOURS  . albuterol (PROVENTIL HFA;VENTOLIN HFA) 108 (90 BASE) MCG/ACT inhaler Inhale 2 puffs into the lungs 2 (two) times daily.  . digoxin (LANOXIN) 0.25 MG tablet Take 0.25 mg by mouth daily.  . [DISCONTINUED] aspirin 81 MG tablet Take 1 tablet by mouth daily.  . [DISCONTINUED] diltiazem (CARDIZEM CD) 240 MG 24 hr capsule Take 240 mg by mouth daily.   . [DISCONTINUED] levofloxacin (LEVAQUIN) 500 MG tablet Take 1 tablet (500 mg total) by mouth daily.  . [DISCONTINUED] vitamin C (ASCORBIC ACID) 500 MG tablet Take by mouth.  . [DISCONTINUED] oseltamivir (TAMIFLU) capsule 75 mg    No facility-administered encounter medications on file as of 09/12/2017.     Activities of Daily Living In your present state of health, do you have any difficulty performing the following activities: 09/12/2017  Hearing? N  Vision? N  Difficulty concentrating or making decisions? N  Walking or climbing stairs? Y  Comment Due to SOB.  Dressing or bathing? N  Doing errands, shopping? N  Preparing Food and  eating ? N  Using the Toilet? N  In the past six months, have you accidently leaked urine? N  Do you have problems with loss of bowel control? N  Managing your Medications? N  Managing your Finances? N  Housekeeping or managing your Housekeeping? N  Some recent data might be hidden    Patient Care Team: Chrismon, Jodell Cipro, PA as PCP - General (Physician Assistant) Dalia Heading, MD as Consulting Physician (Cardiology) Yevonne Pax, MD as Consulting Physician (Internal Medicine)   Assessment:   This is a routine wellness examination  for [place].  Exercise Activities and Dietary recommendations Current Exercise Habits: The patient does not participate in regular exercise at present, Exercise limited by: respiratory conditions(s)  Goals    None      Fall Risk Fall Risk  09/12/2017 05/12/2017 08/11/2016  Falls in the past year? No No No   Is the patient's home free of loose throw rugs in walkways, pet beds, electrical cords, etc?   yes      Grab bars in the bathroom? yes      Handrails on the stairs?   yes      Adequate lighting?   yes  Timed Get Up and Go Performed: N/A  Depression Screen PHQ 2/9 Scores 09/12/2017 05/12/2017 08/11/2016  PHQ - 2 Score 0 0 1    Cognitive Function: Pt declined screening today.      6CIT Screen 08/11/2016  What Year? 0 points  What month? 0 points  What time? 3 points  Count back from 20 0 points  Months in reverse 0 points  Repeat phrase 0 points  Total Score 3    There is no immunization history for the selected administration types on file for this patient.  Qualifies for Shingles Vaccine? Due for Shingles vaccine. Declined my offer to administer today. Education has been provided regarding the importance of this vaccine. Pt has been advised to call her insurance company to determine her out of pocket expense. Advised she may also receive this vaccine at her local pharmacy or Health Dept. Verbalized acceptance and  understanding.  Screening Tests Health Maintenance  Topic Date Due  . Hepatitis C Screening  1951/03/20  . COLONOSCOPY  05/03/2026 (Originally 04/19/2001)  . TETANUS/TDAP  05/03/2026 (Originally 04/19/1970)  . PNA vac Low Risk Adult (1 of 2 - PCV13) 05/03/2026 (Originally 04/19/2016)  . INFLUENZA VACCINE  12/01/2017   Cancer Screenings: Lung: Low Dose CT Chest recommended if Age 46-80 years, 30 pack-year currently smoking OR have quit w/in 15years. Patient does not qualify. Colorectal: Pt declines today.   Additional Screenings:  Hepatitis C Screening: Pt declines today.       Plan:  I have personally reviewed and addressed the Medicare Annual Wellness questionnaire and have noted the following in the patient's chart:  A. Medical and social history B. Use of alcohol, tobacco or illicit drugs  C. Current medications and supplements D. Functional ability and status E.  Nutritional status F.  Physical activity G. Advance directives H. List of other physicians I.  Hospitalizations, surgeries, and ER visits in previous 12 months J.  Vitals K. Screenings such as hearing and vision if needed, cognitive and depression L. Referrals and appointments - none  In addition, I have reviewed and discussed with patient certain preventive protocols, quality metrics, and best practice recommendations. A written personalized care plan for preventive services as well as general preventive health recommendations were provided to patient.  See attached scanned questionnaire for additional information.   Signed,  Hyacinth Meeker, LPN Nurse Health Advisor   Nurse Recommendations: Pt declined a colonoscopy referral, Hep C lab screening, tetanus and pneumonia vaccines today.   Reviewed documentation and recommendations by Nurse Health Advisor. Agree with plan and screening notes. Was available for consultation during this appointment.

## 2017-09-12 NOTE — Patient Instructions (Signed)
[name] , Thank you for taking time to come for your Medicare Wellness Visit. I appreciate your ongoing commitment to your health goals. Please review the following plan we discussed and let me know if I can assist you in the future.   Screening recommendations/referrals: Colonoscopy: Pt declines today.  Recommended yearly ophthalmology/optometry visit for glaucoma screening and checkup Recommended yearly dental visit for hygiene and checkup  Vaccinations: Influenza vaccine: N/A Pneumococcal vaccine: Pt declines today.  Tdap vaccine: Pt declines today.  Shingles vaccine: Pt declines today.     Advanced directives: Please bring a copy of your POA (Power of Attorney) and/or Living Will to your next appointment.   Conditions/risks identified: Obesity- recommend to continue current diet plan of cutting back on carbohydrates to help aid weight loss.   Next appointment: 2:00 PM today with [name].  Preventive Care 67 Years and Older, Male Preventive care refers to lifestyle choices and visits with your health care provider that can promote health and wellness. What does preventive care include?  A yearly physical exam. This is also called an annual well check.  Dental exams once or twice a year.  Routine eye exams. Ask your health care provider how often you should have your eyes checked.  Personal lifestyle choices, including:  Daily care of your teeth and gums.  Regular physical activity.  Eating a healthy diet.  Avoiding tobacco and drug use.  Limiting alcohol use.  Practicing safe sex.  Taking low doses of aspirin every day.  Taking vitamin and mineral supplements as recommended by your health care provider. What happens during an annual well check? The services and screenings done by your health care provider during your annual well check will depend on your age, overall health, lifestyle risk factors, and family history of disease. Counseling  Your  health care provider may ask you questions about your:  Alcohol use.  Tobacco use.  Drug use.  Emotional well-being.  Home and relationship well-being.  Sexual activity.  Eating habits.  History of falls.  Memory and ability to understand (cognition).  Work and work Astronomer. Screening  You may have the following tests or measurements:  Height, weight, and BMI.  Blood pressure.  Lipid and cholesterol levels. These may be checked every 5 years, or more frequently if you are over 58 years old.  Skin check.  Lung cancer screening. You may have this screening every year starting at age 67 if you have a 30-pack-year history of smoking and currently smoke or have quit within the past 15 years.  Fecal occult blood test (FOBT) of the stool. You may have this test every year starting at age 67.  Flexible sigmoidoscopy or colonoscopy. You may have a sigmoidoscopy every 5 years or a colonoscopy every 10 years starting at age 67.  Prostate cancer screening. Recommendations will vary depending on your family history and other risks.  Hepatitis C blood test.  Hepatitis B blood test.  Sexually transmitted disease (STD) testing.  Diabetes screening. This is done by checking your blood sugar (glucose) after you have not eaten for a while (fasting). You may have this done every 1-3 years.  Abdominal aortic aneurysm (AAA) screening. You may need this if you are a current or former smoker.  Osteoporosis. You may be screened starting at age 67 if you are at high risk. Talk with your health care provider about your test results, treatment options, and if necessary, the need for more tests. Vaccines  Your health care  provider may recommend certain vaccines, such as:  Influenza vaccine. This is recommended every year.  Tetanus, diphtheria, and acellular pertussis (Tdap, Td) vaccine. You may need a Td booster every 10 years.  Zoster vaccine. You may need this after age  67.  Pneumococcal 13-valent conjugate (PCV13) vaccine. One dose is recommended after age 67.  Pneumococcal polysaccharide (PPSV23) vaccine. One dose is recommended after age 67. Talk to your health care provider about which screenings and vaccines you need and how often you need them. This information is not intended to replace advice given to you by your health care provider. Make sure you discuss any questions you have with your health care provider. Document Released: 05/16/2015 Document Revised: 01/07/2016 Document Reviewed: 02/18/2015 Elsevier Interactive Patient Education  2017 [place] Prevention in the Home Falls can cause injuries. They can happen to people of all ages. There are many things you can do to make your home safe and to help prevent falls. What can I do on the outside of my home?  Regularly fix the edges of walkways and driveways and fix any cracks.  Remove anything that might make you trip as you walk through a door, such as a raised step or threshold.  Trim any bushes or trees on the path to your home.  Use bright outdoor lighting.  Clear any walking paths of anything that might make someone trip, such as rocks or tools.  Regularly check to see if handrails are loose or broken. Make sure that both sides of any steps have handrails.  Any raised decks and porches should have guardrails on the edges.  Have any leaves, snow, or ice cleared regularly.  Use sand or salt on walking paths during winter.  Clean up any spills in your garage right away. This includes oil or grease spills. What can I do in the bathroom?  Use night lights.  Install grab bars by the toilet and in the tub and shower. Do not use towel bars as grab bars.  Use non-skid mats or decals in the tub or shower.  If you need to sit down in the shower, use a plastic, non-slip stool.  Keep the floor dry. Clean up any water that spills on the floor as soon as it happens.  Remove  soap buildup in the tub or shower regularly.  Attach bath mats securely with double-sided non-slip rug tape.  Do not have throw rugs and other things on the floor that can make you trip. What can I do in the bedroom?  Use night lights.  Make sure that you have a light by your bed that is easy to reach.  Do not use any sheets or blankets that are too big for your bed. They should not hang down onto the floor.  Have a firm chair that has side arms. You can use this for support while you get dressed.  Do not have throw rugs and other things on the floor that can make you trip. What can I do in the kitchen?  Clean up any spills right away.  Avoid walking on wet floors.  Keep items that you use a lot in easy-to-reach places.  If you need to reach something above you, use a strong step stool that has a grab bar.  Keep electrical cords out of the way.  Do not use floor polish or wax that makes floors slippery. If you must use wax, use non-skid floor wax.  Do not have throw  rugs and other things on the floor that can make you trip. What can I do with my stairs?  Do not leave any items on the stairs.  Make sure that there are handrails on both sides of the stairs and use them. Fix handrails that are broken or loose. Make sure that handrails are as long as the stairways.  Check any carpeting to make sure that it is firmly attached to the stairs. Fix any carpet that is loose or worn.  Avoid having throw rugs at the top or bottom of the stairs. If you do have throw rugs, attach them to the floor with carpet tape.  Make sure that you have a light switch at the top of the stairs and the bottom of the stairs. If you do not have them, ask someone to add them for you. What else can I do to help prevent falls?  Wear shoes that:  Do not have high heels.  Have rubber bottoms.  Are comfortable and fit you well.  Are closed at the toe. Do not wear sandals.  If you use a  stepladder:  Make sure that it is fully opened. Do not climb a closed stepladder.  Make sure that both sides of the stepladder are locked into place.  Ask someone to hold it for you, if possible.  Clearly mark and make sure that you can see:  Any grab bars or handrails.  First and last steps.  Where the edge of each step is.  Use tools that help you move around (mobility aids) if they are needed. These include:  Canes.  Walkers.  Scooters.  Crutches.  Turn on the lights when you go into a dark area. Replace any light bulbs as soon as they burn out.  Set up your furniture so you have a clear path. Avoid moving your furniture around.  If any of your floors are uneven, fix them.  If there are any pets around you, be aware of where they are.  Review your medicines with your doctor. Some medicines can make you feel dizzy. This can increase your chance of falling. Ask your doctor what other things that you can do to help prevent falls. This information is not intended to replace advice given to you by your health care provider. Make sure you discuss any questions you have with your health care provider. Document Released: 02/13/2009 Document Revised: 09/25/2015 Document Reviewed: 05/24/2014 Elsevier Interactive Patient Education  2017 [employer].

## 2017-09-12 NOTE — Progress Notes (Signed)
Patient: [name], Male    DOB: 1950/06/09, 67 y.o.   MRN: [id number] Visit Date: 09/12/2017  Today's Provider: Dortha Kern, PA   Chief Complaint  Patient presents with  . Annual Exam   Subjective:     Complete Physical [name] is a 67 y.o. male. He feels fair. He states he has constant pain and becomes short of breathe on exertion.  He reports exercising none. He reports he is sleeping well.  -----------------------------------------------------------  AWE was done prior to visit today. He denies Colonoscopy, Hep C screening, and all vaccines.    Review of Systems  Constitutional: Negative.   HENT: Positive for tinnitus.   Respiratory: Positive for shortness of breath and wheezing.   Cardiovascular: Positive for leg swelling.  Gastrointestinal: Negative.   Endocrine: Negative.   Genitourinary: Negative.   Musculoskeletal: Positive for arthralgias, back pain, neck pain and neck stiffness.  Skin: Negative.   Allergic/Immunologic: Negative.   Neurological: Positive for headaches.  Hematological: Negative.   Psychiatric/Behavioral: Negative.     Social History   Socioeconomic History  . Marital status: Married    Spouse name: Not on file  . Number of children: Not on file  . Years of education: Not on file  . Highest education level: Some college, no degree  Occupational History  . Occupation: retired  . Occupation: disabled  Social Needs  . Financial resource strain: Not hard at all  . Food insecurity:    Worry: Never true    Inability: Never true  . Transportation needs:    Medical: No    Non-medical: No  Tobacco Use  . Smoking status: Former Smoker    Types: Cigarettes  . Smokeless tobacco: Never Used  . Tobacco comment: quit in 2003  Substance and Sexual Activity  . Alcohol use: No    Alcohol/week: 0.0 oz  . Drug use: No  . Sexual activity: Never    Birth control/protection: None  Lifestyle  . Physical activity:   Days per week: Not on file    Minutes per session: Not on file  . Stress: Not at all  Relationships  . Social connections:    Talks on phone: Not on file    Gets together: Not on file    Attends religious service: Not on file    Active member of club or organization: Not on file    Attends meetings of clubs or organizations: Not on file    Relationship status: Not on file  . Intimate partner violence:    Fear of current or ex partner: Not on file    Emotionally abused: Not on file    Physically abused: Not on file    Forced sexual activity: Not on file  Other Topics Concern  . Not on file  Social History Narrative  . Not on file    Past Medical History:  Diagnosis Date  . Anxiety   . Arthritis    in back and knees  . Asthma   . CHF (congestive heart failure) (HCC)   . COPD (chronic obstructive pulmonary disease) (HCC)   . Dyspnea   . Hypertension   . Obesity     Patient Active Problem List   Diagnosis Date Noted  . Acute upper respiratory infection, unspecified 05/12/2017  . Vitamin D deficiency, unspecified 03/28/2017  . Osteoarthritis of both knees 03/28/2017  . DDD (degenerative disc disease), lumbar 03/28/2017  . Elevated rheumatoid factor 01/12/2017  . Arthralgia 01/12/2017  .  Influenzal pneumonia 05/17/2016  . A-fib (HCC) 10/03/2014  . Chronic asthmatic bronchitis with acute exacerbation (HCC) 10/03/2014  . Chronic respiratory failure (HCC) 10/03/2014  . CAFL (chronic airflow limitation) (HCC) 10/03/2014  . Cervical osteoarthritis 10/03/2014  . Chronic obstructive pulmonary emphysema (HCC) 10/03/2014  . Fatigue 10/03/2014  . History of atrial fibrillation 10/03/2014  . Obstructive apnea 10/03/2014  . Disturbance of skin sensation 10/03/2014  . Edema, peripheral 10/03/2014  . CCF (congestive cardiac failure) (HCC) 03/26/2014  . Chronic obstructive pulmonary disease (HCC) 09/21/2013  . Carbuncle and furuncle of buttock 12/04/2009  . Elevated WBC count  01/09/2009  . Hypoxemia 12/10/2008  . 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 Accumulation of fluid in tissues 03/31/2006  . Esophagitis, reflux 03/31/2006  . Decreased potassium in the blood 03/31/2006  . Adiposity 03/31/2006  . Primary localized osteoarthrosis, lower leg 03/31/2006  . Apnea, sleep 03/31/2006   Past Surgical History:  Procedure Laterality Date  . APPENDECTOMY  1974  . CHOLECYSTECTOMY  1974  . NASAL SINUS SURGERY  [id number]  . TONSILLECTOMY AND ADENOIDECTOMY  1970  . VASECTOMY  1979   His family history includes Congestive Heart Failure in his father; Heart disease in his mother.     Current Outpatient Medications:  .  ADVAIR DISKUS 500-50 MCG/DOSE AEPB, INHALE 1 PUFF TWICE DAILY. RINSE MOUTH AFTER EACH USE, Disp: 60 each, Rfl: 11 .  albuterol (PROVENTIL HFA;VENTOLIN HFA) 108 (90 BASE) MCG/ACT inhaler, Inhale 2 puffs into the lungs 2 (two) times daily., Disp: , Rfl:  .  COMBIVENT RESPIMAT 20-100 MCG/ACT AERS respimat, USE ONE PUFF FOUR TIMES DAILY, Disp: 4 g, Rfl: 12 .  digoxin (LANOXIN) 0.25 MG tablet, Take 0.25 mg by mouth daily., Disp: , Rfl:  .  diltiazem (CARDIZEM CD) 300 MG 24 hr capsule, Take 300 mg by mouth daily. , Disp: , Rfl:  .  furosemide (LASIX) 40 MG tablet, TAKE ONE TABLET TWICE DAILY AS NEEDED, Disp: 60 tablet, Rfl: 6 .  ibuprofen (ADVIL,MOTRIN) 200 MG tablet, Take 1 tablet by mouth as needed., Disp: , Rfl:  .  ipratropium-albuterol (DUONEB) 0.5-2.5 (3) MG/3ML SOLN, Take 3 mLs by nebulization every 4 (four) hours as needed., Disp: , Rfl:  .  montelukast (SINGULAIR) 10 MG tablet, TAKE ONE TABLET AT BEDTIME, Disp: 30 tablet, Rfl: 11 .  morphine (ROXANOL) 20 MG/ML concentrated solution, Take 20 mg by mouth every 4 (four) hours as needed for shortness of breath. , Disp: , Rfl:  .  MULTIPLE VITAMIN PO, Take 1 tablet by mouth daily., Disp: , Rfl:  .  Multiple Vitamins-Minerals (ICAPS AREDS 2) CAPS, Take 2 each by mouth  every 12 (twelve) hours., Disp: , Rfl:  .  potassium chloride (K-DUR,KLOR-CON) 10 MEQ tablet, Take 1 tablet by mouth daily. Reported on 10/24/2015, Disp: , Rfl:  .  predniSONE (DELTASONE) 10 MG tablet, TAKE ONE (1) TABLET EACH DAY, Disp: 30 tablet, Rfl: 2 .  ranitidine (ZANTAC) 150 MG tablet, TAKE ONE TABLET EVERY 12 HOURS, Disp: 60 tablet, Rfl: 6  Patient Care Team: Azani Brogdon, Jodell Cipro, PA as PCP - General (Physician Assistant) Dalia Heading, MD as Consulting Physician (Cardiology) Yevonne Pax, MD as Consulting Physician (Internal Medicine)     Objective:   Vitals: BP 134/60   Pulse 92   Temp 99.1 F (37.3 C) (Oral)   Physical Exam  Constitutional: He is oriented to person, place, and time. He appears well-developed and well-nourished. No distress.  HENT:  Head: Normocephalic and atraumatic.  Right Ear: Hearing and external ear normal.  Left Ear: Hearing and external ear normal.  Nose: Nose normal.  Eyes: Conjunctivae and lids are normal. Right eye exhibits no discharge. Left eye exhibits no discharge. No scleral icterus.  Cardiovascular: Normal rate and regular rhythm.  Pulmonary/Chest: No respiratory distress.  Dyspnea with any exertion or without oxygen. Breath sounds very distant.  Musculoskeletal: He exhibits edema.  Slight crepitus in knees. Fair ROM. 1-2+ pitting edema above ankles. Good pulses.  Neurological: He is alert and oriented to person, place, and time.  Skin: Skin is intact. No lesion and no rash noted.  Psychiatric: He has a normal mood and affect. His speech is normal and behavior is normal. Thought content normal.   Activities of Daily Living In your present state of health, do you have any difficulty performing the following activities: 09/12/2017  Hearing? N  Vision? N  Difficulty concentrating or making decisions? N  Walking or climbing stairs? Y  Comment Due to SOB.  Dressing or bathing? N  Doing errands, shopping? N  Preparing Food and eating ?  N  Using the Toilet? N  In the past six months, have you accidently leaked urine? N  Do you have problems with loss of bowel control? N  Managing your Medications? N  Managing your Finances? N  Housekeeping or managing your Housekeeping? N  Some recent data might be hidden   Fall Risk Assessment Fall Risk  09/12/2017 05/12/2017 08/11/2016  Falls in the past year? No No No   Depression Screen PHQ 2/9 Scores 09/12/2017 05/12/2017 08/11/2016  PHQ - 2 Score 0 0 1    Assessment & Plan:    Annual Physical Reviewed patient's Family Medical History Reviewed and updated list of patient's medical providers Assessment of cognitive impairment was done Assessed patient's functional ability Established a written schedule for health screening services Health Risk Assessent Completed and Reviewed  Exercise Activities and Dietary recommendations Goals    Unable to exercise due to severe hypoxia with dyspnea. Encouraged to drink plenty of fluids.      There is no immunization history for the selected administration types on file for this patient.  Health Maintenance  Topic Date Due  . Hepatitis C Screening  09/13/2018 (Originally 11/12/1950)  . COLONOSCOPY  05/03/2026 (Originally 04/19/2001)  . TETANUS/TDAP  05/03/2026 (Originally 04/19/1970)  . PNA vac Low Risk Adult (1 of 2 - PCV13) 05/03/2026 (Originally 04/19/2016)  . INFLUENZA VACCINE  12/01/2017    Discussed health benefits of physical activity, and encouraged him to engage in regular exercise appropriate for his age and condition.    ------------------------------------------------------------------------------------------------------------  1. Annual physical exam Unable to get up on the scales due to severe obesity, emphysema and CHF. Medicare screening accomplished by Nurse Health Advisor. Recheck routine labs and follow up pending reports. - CBC with Differential/Platelet - Comprehensive metabolic panel - Lipid panel -  TSH  2. Chronic atrial fibrillation (HCC) Followed by Dr. Lady Gary (cardiologist) 3 weeks ago and increased Diltiazem to 300 mg qd. Feels stable. - CBC with Differential/Platelet - Comprehensive metabolic panel  3. Centrilobular emphysema (HCC) States dyspnea started in 2002 after chlorine powder inhalation while working on a pool. Has had COPD with asthma since that time and on oxygen 2 LPM by nasal cannula. Check CBC and recheck prn. - CBC with Differential/Platelet  4. Chronic respiratory failure with hypoxia (HCC) Continues to have chronic respiratory failure issues. Was hospitalized May 17, 2016 and placed  on a ventilator again - secondary to influenzal pneumonia this time. Has a history of COPD with asthma. Continues to use Advair 500-50 1 inhalation BID, Ventolin-HFA 2 puffs QID prn, Combivent 20-200 mcg 1 puff QID and Singulair 10 mg hs with oxygen 2 LPM 24 hours a day and BiPAP at night. Has Roxanol /87ml 90 ml that he will use 1 ml by dropper q 4 hours prn severe flare of dyspnea (rarely has to use it at all - Dr. Welton Flakes gave original prescription). Oxygen by pulse ox is down to 76% at times at home despite use of oxygen.  5. Obstructive apnea States he sleeps better using the BiPAP with oxygen at night  6. Essential hypertension With history of atrial fibrillation, he is on Diltiazem 300 mg qd and BP well controlled. Proceed with routine follow up labs. - CBC with Differential/Platelet - Comprehensive metabolic panel - Lipid panel - TSH  Dortha Kern, PA  [facility]

## 2017-09-13 ENCOUNTER — Telehealth: Payer: Self-pay

## 2017-09-13 DIAGNOSIS — D72829 Elevated white blood cell count, unspecified: Secondary | ICD-10-CM

## 2017-09-13 LAB — CBC WITH DIFFERENTIAL/PLATELET
BASOS: 0 %
Basophils Absolute: 0.1 10*3/uL (ref 0.0–0.2)
EOS (ABSOLUTE): 0.1 10*3/uL (ref 0.0–0.4)
EOS: 1 %
HEMOGLOBIN: 13.1 g/dL (ref 13.0–17.7)
Hematocrit: 41.8 % (ref 37.5–51.0)
IMMATURE GRANS (ABS): 0 10*3/uL (ref 0.0–0.1)
IMMATURE GRANULOCYTES: 0 %
LYMPHS: 10 %
Lymphocytes Absolute: 1.5 10*3/uL (ref 0.7–3.1)
MCH: 28.7 pg (ref 26.6–33.0)
MCHC: 31.3 g/dL — ABNORMAL LOW (ref 31.5–35.7)
MCV: 92 fL (ref 79–97)
MONOCYTES: 5 %
Monocytes Absolute: 0.7 10*3/uL (ref 0.1–0.9)
Neutrophils Absolute: 12.6 10*3/uL — ABNORMAL HIGH (ref 1.4–7.0)
Neutrophils: 84 %
PLATELETS: 313 10*3/uL (ref 150–379)
RBC: 4.57 x10E6/uL (ref 4.14–5.80)
RDW: 15 % (ref 12.3–15.4)
WBC: 15 10*3/uL — ABNORMAL HIGH (ref 3.4–10.8)

## 2017-09-13 LAB — COMPREHENSIVE METABOLIC PANEL
A/G RATIO: 1.7 (ref 1.2–2.2)
ALBUMIN: 4.6 g/dL (ref 3.6–4.8)
ALT: 26 IU/L (ref 0–44)
AST: 28 IU/L (ref 0–40)
Alkaline Phosphatase: 93 IU/L (ref 39–117)
BUN / CREAT RATIO: 18 (ref 10–24)
BUN: 17 mg/dL (ref 8–27)
Bilirubin Total: 0.2 mg/dL (ref 0.0–1.2)
CALCIUM: 9.8 mg/dL (ref 8.6–10.2)
CO2: 23 mmol/L (ref 20–29)
Chloride: 97 mmol/L (ref 96–106)
Creatinine, Ser: 0.92 mg/dL (ref 0.76–1.27)
GFR, EST AFRICAN AMERICAN: 100 mL/min/{1.73_m2} (ref >59)
GFR, EST NON AFRICAN AMERICAN: 86 mL/min/{1.73_m2} (ref >59)
Globulin, Total: 2.7 g/dL (ref 1.5–4.5)
Glucose: 102 mg/dL — ABNORMAL HIGH (ref 65–99)
POTASSIUM: 4.7 mmol/L (ref 3.5–5.2)
Sodium: 139 mmol/L (ref 134–144)
TOTAL PROTEIN: 7.3 g/dL (ref 6.0–8.5)

## 2017-09-13 LAB — LIPID PANEL
CHOL/HDL RATIO: 3.3 ratio (ref 0.0–5.0)
Cholesterol, Total: 193 mg/dL (ref 100–199)
HDL: 59 mg/dL (ref >39)
LDL Calculated: 111 mg/dL — ABNORMAL HIGH (ref 0–99)
Triglycerides: 114 mg/dL (ref 0–149)
VLDL CHOLESTEROL CAL: 23 mg/dL (ref 5–40)

## 2017-09-13 LAB — TSH: TSH: 0.685 u[IU]/mL (ref 0.450–4.500)

## 2017-09-13 NOTE — Telephone Encounter (Signed)
Patient advised. He will call back if he develops any symptoms of infection. CBC ordered as a future lab. Released and lab slip at front desk at [address].

## 2017-09-13 NOTE — Telephone Encounter (Signed)
-----   Message from Tamsen Roers, [place] sent at 09/13/2017  8:12 AM EDT ----- All blood tests essentially normal except WBC count elevated. Watch for signs of infection - may need antibiotic help. LDL cholesterol slightly up but the rest of the lipids are in good shape which has lowered cardiovascular risk ratio down to 3.3. Recheck WBC count in 1 month.

## 2017-09-14 ENCOUNTER — Other Ambulatory Visit: Payer: Self-pay | Admitting: Family Medicine

## 2017-09-14 DIAGNOSIS — J9611 Chronic respiratory failure with hypoxia: Secondary | ICD-10-CM

## 2017-10-13 ENCOUNTER — Ambulatory Visit (INDEPENDENT_AMBULATORY_CARE_PROVIDER_SITE_OTHER): Payer: Medicare Other | Admitting: Family Medicine

## 2017-10-13 ENCOUNTER — Encounter: Payer: Self-pay | Admitting: Family Medicine

## 2017-10-13 VITALS — BP 140/70 | HR 85 | Temp 98.5°F

## 2017-10-13 DIAGNOSIS — J432 Centrilobular emphysema: Secondary | ICD-10-CM

## 2017-10-13 DIAGNOSIS — J209 Acute bronchitis, unspecified: Secondary | ICD-10-CM

## 2017-10-13 DIAGNOSIS — S46812A Strain of other muscles, fascia and tendons at shoulder and upper arm level, left arm, initial encounter: Secondary | ICD-10-CM | POA: Diagnosis not present

## 2017-10-13 DIAGNOSIS — J440 Chronic obstructive pulmonary disease with acute lower respiratory infection: Secondary | ICD-10-CM | POA: Diagnosis not present

## 2017-10-13 MED ORDER — LEVOFLOXACIN 500 MG PO TABS: 500.0000 mg | ORAL_TABLET | Freq: Every day | ORAL | 0 refills | Status: DC

## 2017-10-13 MED ORDER — PREDNISONE 5 MG PO TABS: 5.0000 mg | ORAL_TABLET | Freq: Every day | ORAL | 0 refills | Status: DC

## 2017-10-13 MED ORDER — METHYLPREDNISOLONE ACETATE 80 MG/ML IJ SUSP
80.0000 mg | Freq: Once | INTRAMUSCULAR | Status: AC
  Administered 2017-10-13: 80 mg via INTRAMUSCULAR

## 2017-10-13 NOTE — Progress Notes (Signed)
Patient: [name] Male    DOB: [birth year]   67 y.o.   MRN: [id number] Visit Date: 10/13/2017  Today's Provider: Dortha Kern, PA   Chief Complaint  Patient presents with  . URI  . Shoulder Pain   Subjective:    Shoulder Pain   The pain is present in the left shoulder. This is a new problem. The current episode started more than 1 month ago. The quality of the pain is described as aching and dull. The symptoms are aggravated by activity.  URI   This is a new problem. Episode onset: 3-4 days ago. The problem has been gradually worsening. There has been no fever. Associated symptoms include congestion, coughing (productive), neck pain and wheezing. Associated symptoms comments: SOB. He has tried nothing for the symptoms.     Past Medical History:  Diagnosis Date  . Anxiety   . Arthritis    in back and knees  . Asthma   . CHF (congestive heart failure) (HCC)   . COPD (chronic obstructive pulmonary disease) (HCC)   . Dyspnea   . Hypertension   . Obesity    No care team member to display Family History  Problem Relation Age of Onset  . Heart disease Mother   . Congestive Heart Failure Father    Patient Active Problem List   Diagnosis Date Noted  . Acute upper respiratory infection, unspecified 05/12/2017  . Vitamin D deficiency, unspecified 03/28/2017  . Osteoarthritis of both knees 03/28/2017  . DDD (degenerative disc disease), lumbar 03/28/2017  . Elevated rheumatoid factor 01/12/2017  . Arthralgia 01/12/2017  . Influenzal pneumonia 05/17/2016  . A-fib (HCC) 10/03/2014  . Chronic asthmatic bronchitis with acute exacerbation (HCC) 10/03/2014  . Chronic respiratory failure (HCC) 10/03/2014  . CAFL (chronic airflow limitation) (HCC) 10/03/2014  . Cervical osteoarthritis 10/03/2014  . Chronic obstructive pulmonary emphysema (HCC) 10/03/2014  . Fatigue 10/03/2014  . History of atrial fibrillation 10/03/2014  . Obstructive apnea 10/03/2014  . Disturbance  of skin sensation 10/03/2014  . Edema, peripheral 10/03/2014  . CCF (congestive cardiac failure) (HCC) 03/26/2014  . Chronic obstructive pulmonary disease (HCC) 09/21/2013  . Carbuncle and furuncle of buttock 12/04/2009  . Elevated WBC count 01/09/2009  . Hypoxemia 12/10/2008  . 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 Accumulation of fluid in tissues 03/31/2006  . Esophagitis, reflux 03/31/2006  . Decreased potassium in the blood 03/31/2006  . Adiposity 03/31/2006  . Primary localized osteoarthrosis, lower leg 03/31/2006  . Apnea, sleep 03/31/2006   Allergies  Allergen Reactions  . Promethazine Other (See Comments)    Caused pt to become disoriented after receiving 6.25mg  IV. Caused pt to become disoriented after receiving 6.25mg  IV.  Marland Kitchen Aspirin     Other reaction(s): Asthma flare up  . Sulfa Antibiotics     Current Outpatient Medications:  .  ADVAIR DISKUS 500-50 MCG/DOSE AEPB, INHALE 1 PUFF TWICE DAILY. RINSE MOUTH AFTER EACH USE, Disp: 60 each, Rfl: 10 .  albuterol (PROVENTIL HFA;VENTOLIN HFA) 108 (90 BASE) MCG/ACT inhaler, Inhale 2 puffs into the lungs 2 (two) times daily., Disp: , Rfl:  .  COMBIVENT RESPIMAT 20-100 MCG/ACT AERS respimat, USE ONE PUFF FOUR TIMES DAILY, Disp: 4 g, Rfl: 12 .  diltiazem (CARDIZEM CD) 300 MG 24 hr capsule, Take 300 mg by mouth daily. , Disp: , Rfl:  .  furosemide (LASIX) 40 MG tablet, TAKE ONE TABLET TWICE DAILY AS NEEDED, Disp: 60 tablet, Rfl: 6 .  ibuprofen (ADVIL,MOTRIN) 200 MG tablet, Take 1 tablet by mouth as needed., Disp: , Rfl:  .  ipratropium-albuterol (DUONEB) 0.5-2.5 (3) MG/3ML SOLN, Take 3 mLs by nebulization every 4 (four) hours as needed., Disp: , Rfl:  .  montelukast (SINGULAIR) 10 MG tablet, TAKE ONE TABLET AT BEDTIME, Disp: 30 tablet, Rfl: 11 .  morphine (ROXANOL) 20 MG/ML concentrated solution, Take 20 mg by mouth every 4 (four) hours as needed for shortness of breath. , Disp: , Rfl:  .  MULTIPLE  VITAMIN PO, Take 1 tablet by mouth daily., Disp: , Rfl:  .  Multiple Vitamins-Minerals (ICAPS AREDS 2) CAPS, Take 2 each by mouth every 12 (twelve) hours., Disp: , Rfl:  .  potassium chloride SA (K-DUR,KLOR-CON) 20 MEQ tablet, , Disp: , Rfl:  .  predniSONE (DELTASONE) 10 MG tablet, TAKE ONE (1) TABLET EACH DAY, Disp: 30 tablet, Rfl: 2 .  ranitidine (ZANTAC) 150 MG tablet, TAKE ONE TABLET EVERY 12 HOURS, Disp: 60 tablet, Rfl: 6 .  digoxin (LANOXIN) 0.25 MG tablet, Take 0.25 mg by mouth daily., Disp: , Rfl:    Review of Systems  Constitutional: Negative.   HENT: Positive for congestion.   Respiratory: Positive for cough (productive) and wheezing.   Cardiovascular: Negative.   Musculoskeletal: Positive for neck pain.       Shoulder pain     Social History   Tobacco Use  . Smoking status: Former Smoker    Types: Cigarettes  . Smokeless tobacco: Never Used  . Tobacco comment: quit in 2003  Substance Use Topics  . Alcohol use: No    Alcohol/week: 0.0 oz   Objective:   BP 140/70 (BP Location: Right Arm, Patient Position: Sitting, Cuff Size: Large)   Pulse 85   Temp 98.5 F (36.9 C) (Oral)   SpO2 95%  Vitals:   10/13/17 1552  BP: 140/70  Pulse: 85  Temp: 98.5 F (36.9 C)  TempSrc: Oral  SpO2: 95%    Physical Exam  Constitutional: He is oriented to person, place, and time. He appears well-developed and well-nourished. No distress.  HENT:  Head: Normocephalic and atraumatic.  Right Ear: Hearing normal.  Left Ear: Hearing normal.  Nose: Nose normal.  Eyes: Conjunctivae and lids are normal. Right eye exhibits no discharge. Left eye exhibits no discharge. No scleral icterus.  Cardiovascular: Normal rate.  Pulmonary/Chest: He is in respiratory distress. He has wheezes.  Tight wheezes and very distant breath sounds.  Musculoskeletal: Normal range of motion.  Neurological: He is alert and oriented to person, place, and time.  Skin: Skin is intact. No lesion and no rash noted.   Psychiatric: He has a normal mood and affect. His speech is normal and behavior is normal. Thought content normal.      Assessment & Plan:     1. Acute bronchitis with COPD (HCC) Over the past week, has had purulent sputum in large quantities with some hot flashes. Temperature normal at the present. Still on Oxygen at 3-4 LPM with BiPAP at night. CBC on 09-13-17 showed WBC count 15,000 without AECOPD. Will give Depo-Medrol injection and add Levaquin 500 mg qd. Reminded him to use Duoneb QID with regular use of other inhalers. Recheck CBC with diff. Advised to go to ER if worsening or fever. - methylPREDNISolone acetate (DEPO-MEDROL) injection 80 mg - levofloxacin (LEVAQUIN) 500 MG tablet; Take 1 tablet (500 mg total) by mouth daily.  Dispense: 7 tablet; Refill: 0 - CBC with Differential/Platelet  2. Centrilobular emphysema (HCC)  Still on the Advair 500-50 mcg/dose 1 inhalation BID, Prednisone 10 mg qd and Duoneb by nebulizer QID prn wheezes/asthma. Given low dose prednisone taper to maintain higher steroid level for a week. Recheck prn or go to ER if dyspnea worsens. - predniSONE (DELTASONE) 5 MG tablet; Take 1 tablet (5 mg total) by mouth daily with breakfast. Taper down by one tablet daily (6-5-4-3-2-1)  Dispense: 21 tablet; Refill: 0  3. Strain of left trapezius muscle, initial encounter Onset over the past several days. No specific injury noted. Sharp pains with extension of neck or rotation to the left shoulder against resistance. Apply moist heat or may apply Aspercreme with Lidocaine. Given rehab/stretching exercises and prednisone taper.       Dortha Kern, PA  [facility]

## 2017-10-14 LAB — CBC WITH DIFFERENTIAL/PLATELET
BASOS ABS: 0 10*3/uL (ref 0.0–0.2)
Basos: 0 %
EOS (ABSOLUTE): 0 10*3/uL (ref 0.0–0.4)
Eos: 0 %
HEMOGLOBIN: 12.3 g/dL — AB (ref 13.0–17.7)
Hematocrit: 39.1 % (ref 37.5–51.0)
Immature Grans (Abs): 0 10*3/uL (ref 0.0–0.1)
Immature Granulocytes: 0 %
LYMPHS ABS: 0.9 10*3/uL (ref 0.7–3.1)
Lymphs: 7 %
MCH: 28.3 pg (ref 26.6–33.0)
MCHC: 31.5 g/dL (ref 31.5–35.7)
MCV: 90 fL (ref 79–97)
Monocytes Absolute: 0.7 10*3/uL (ref 0.1–0.9)
Monocytes: 5 %
NEUTROS ABS: 11.9 10*3/uL — AB (ref 1.4–7.0)
Neutrophils: 88 %
PLATELETS: 269 10*3/uL (ref 150–450)
RBC: 4.35 x10E6/uL (ref 4.14–5.80)
RDW: 14.9 % (ref 12.3–15.4)
WBC: 13.5 10*3/uL — AB (ref 3.4–10.8)

## 2017-10-14 LAB — SPECIMEN STATUS REPORT

## 2017-10-14 NOTE — Progress Notes (Signed)
Advised and appointment made 

## 2017-10-20 ENCOUNTER — Encounter: Payer: Self-pay | Admitting: Family Medicine

## 2017-10-20 ENCOUNTER — Ambulatory Visit (INDEPENDENT_AMBULATORY_CARE_PROVIDER_SITE_OTHER): Payer: Medicare Other | Admitting: Family Medicine

## 2017-10-20 VITALS — BP 128/74 | HR 75 | Temp 98.2°F

## 2017-10-20 DIAGNOSIS — J432 Centrilobular emphysema: Secondary | ICD-10-CM | POA: Diagnosis not present

## 2017-10-20 DIAGNOSIS — S46812D Strain of other muscles, fascia and tendons at shoulder and upper arm level, left arm, subsequent encounter: Secondary | ICD-10-CM | POA: Diagnosis not present

## 2017-10-20 DIAGNOSIS — J441 Chronic obstructive pulmonary disease with (acute) exacerbation: Secondary | ICD-10-CM

## 2017-10-20 NOTE — Progress Notes (Signed)
Patient: [name] Male    DOB: [birth year]   67 y.o.   MRN: [id number] Visit Date: 10/20/2017  Today's Provider: Dortha Kernennis Handsome Anglin, PA   Chief Complaint  Patient presents with  . Bronchitis with COPD follow up   Subjective:    HPI Acute Bronchitis with COPD:  Patient presents for a 1 week follow up. Last OV was on 10/13/17. Patient started Levaquin 500 mg and Prednisone taper dose. He reports good compliance with treatment plan. Symptoms are improved. Patient states he experienced some tachycardia this morning. He reports having a PMH of A-Fib and has episodes of tachycardia every couple weeks.     Patient Active Problem List   Diagnosis Date Noted  . Acute upper respiratory infection, unspecified 05/12/2017  . Vitamin D deficiency, unspecified 03/28/2017  . Osteoarthritis of both knees 03/28/2017  . DDD (degenerative disc disease), lumbar 03/28/2017  . Elevated rheumatoid factor 01/12/2017  . Arthralgia 01/12/2017  . Influenzal pneumonia 05/17/2016  . A-fib (HCC) 10/03/2014  . Chronic asthmatic bronchitis with acute exacerbation (HCC) 10/03/2014  . Chronic respiratory failure (HCC) 10/03/2014  . CAFL (chronic airflow limitation) (HCC) 10/03/2014  . Cervical osteoarthritis 10/03/2014  . Chronic obstructive pulmonary emphysema (HCC) 10/03/2014  . Fatigue 10/03/2014  . History of atrial fibrillation 10/03/2014  . Obstructive apnea 10/03/2014  . Disturbance of skin sensation 10/03/2014  . Edema, peripheral 10/03/2014  . CCF (congestive cardiac failure) (HCC) 03/26/2014  . Chronic obstructive pulmonary disease (HCC) 09/21/2013  . Carbuncle and furuncle of buttock 12/04/2009  . Elevated WBC count 01/09/2009  . Hypoxemia 12/10/2008  . 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 Accumulation of fluid in tissues 03/31/2006  . Esophagitis, reflux 03/31/2006  . Decreased potassium in the blood 03/31/2006  . Adiposity 03/31/2006  . Primary  localized osteoarthrosis, lower leg 03/31/2006  . Apnea, sleep 03/31/2006   . Past Surgical History:  Procedure Laterality Date  . APPENDECTOMY  1974  . CHOLECYSTECTOMY  1974  . NASAL SINUS SURGERY  [phone]  . TONSILLECTOMY AND ADENOIDECTOMY  1970  . VASECTOMY  1979   Family History  Problem Relation Age of Onset  . Heart disease Mother   . Congestive Heart Failure Father    Allergies  Allergen Reactions  . Promethazine Other (See Comments)    Caused pt to become disoriented after receiving 6.25mg  IV. Caused pt to become disoriented after receiving 6.25mg  IV.  Marland Kitchen. Aspirin     Other reaction(s): Asthma flare up  . Sulfa Antibiotics     Current Outpatient Medications:  .  ADVAIR DISKUS 500-50 MCG/DOSE AEPB, INHALE 1 PUFF TWICE DAILY. RINSE MOUTH AFTER EACH USE, Disp: 60 each, Rfl: 10 .  albuterol (PROVENTIL HFA;VENTOLIN HFA) 108 (90 BASE) MCG/ACT inhaler, Inhale 2 puffs into the lungs 2 (two) times daily., Disp: , Rfl:  .  COMBIVENT RESPIMAT 20-100 MCG/ACT AERS respimat, USE ONE PUFF FOUR TIMES DAILY, Disp: 4 g, Rfl: 12 .  digoxin (LANOXIN) 0.25 MG tablet, Take 0.25 mg by mouth daily., Disp: , Rfl:  .  diltiazem (CARDIZEM CD) 300 MG 24 hr capsule, Take 300 mg by mouth daily. , Disp: , Rfl:  .  furosemide (LASIX) 40 MG tablet, TAKE ONE TABLET TWICE DAILY AS NEEDED, Disp: 60 tablet, Rfl: 6 .  ibuprofen (ADVIL,MOTRIN) 200 MG tablet, Take 1 tablet by mouth as needed., Disp: , Rfl:  .  ipratropium-albuterol (DUONEB) 0.5-2.5 (3) MG/3ML SOLN, Take 3 mLs by nebulization every  4 (four) hours as needed., Disp: , Rfl:  .  levofloxacin (LEVAQUIN) 500 MG tablet, Take 1 tablet (500 mg total) by mouth daily., Disp: 7 tablet, Rfl: 0 .  montelukast (SINGULAIR) 10 MG tablet, TAKE ONE TABLET AT BEDTIME, Disp: 30 tablet, Rfl: 11 .  morphine (ROXANOL) 20 MG/ML concentrated solution, Take 20 mg by mouth every 4 (four) hours as needed for shortness of breath. , Disp: , Rfl:  .  MULTIPLE VITAMIN  PO, Take 1 tablet by mouth daily., Disp: , Rfl:  .  Multiple Vitamins-Minerals (ICAPS AREDS 2) CAPS, Take 2 each by mouth every 12 (twelve) hours., Disp: , Rfl:  .  potassium chloride SA (K-DUR,KLOR-CON) 20 MEQ tablet, , Disp: , Rfl:  .  predniSONE (DELTASONE) 10 MG tablet, TAKE ONE (1) TABLET EACH DAY, Disp: 30 tablet, Rfl: 2 .  predniSONE (DELTASONE) 5 MG tablet, Take 1 tablet (5 mg total) by mouth daily with breakfast. Taper down by one tablet daily (6-5-4-3-2-1), Disp: 21 tablet, Rfl: 0 .  ranitidine (ZANTAC) 150 MG tablet, TAKE ONE TABLET EVERY 12 HOURS, Disp: 60 tablet, Rfl: 6  Review of Systems  Constitutional: Negative.   HENT: Positive for congestion.   Respiratory: Positive for cough, shortness of breath and wheezing.   Cardiovascular: Negative.    Social History   Tobacco Use  . Smoking status: Former Smoker    Types: Cigarettes  . Smokeless tobacco: Never Used  . Tobacco comment: quit in 2003  Substance Use Topics  . Alcohol use: No    Alcohol/week: 0.0 oz   Objective:   BP 128/74 (BP Location: Right Arm, Patient Position: Sitting, Cuff Size: Large)   Pulse 75   Temp 98.2 F (36.8 C) (Oral)   SpO2 94%  Vitals:   10/20/17 1456  BP: 128/74  Pulse: 75  Temp: 98.2 F (36.8 C)  TempSrc: Oral  SpO2: 94%   Physical Exam  Constitutional: He is oriented to person, place, and time. He appears well-developed and well-nourished. No distress.  HENT:  Head: Normocephalic and atraumatic.  Right Ear: Hearing normal.  Left Ear: Hearing normal.  Nose: Nose normal.  Eyes: Conjunctivae and lids are normal. Right eye exhibits no discharge. Left eye exhibits no discharge. No scleral icterus.  Cardiovascular: Normal rate.  Pulmonary/Chest: He is in respiratory distress.  Clearer to auscultation with very distant breath sounds. No rales.  Musculoskeletal: Normal range of motion.  Neurological: He is alert and oriented to person, place, and time.  Skin: Skin is intact. No  lesion and no rash noted.  Psychiatric: He has a normal mood and affect. His speech is normal and behavior is normal. Thought content normal.      Assessment & Plan:     1. Chronic asthmatic bronchitis with acute exacerbation (HCC) Feeling better and less cough. No fever, finished the antibiotic and prednisone taper. WBC count on 10-13-17 was 12,500. Much clearer to auscultate chest. Energy level improved, also. No significant sputum production today.  2. Centrilobular emphysema (HCC) Pulse oximetry 94% on constant oxygen at 2-3 LPM by nasal cannula with BiPAP at night. Continues to use the Advair BID and Duoneb QID by nebulizer prn. Has Albuterol-HFA prn. Back down to prednisone 10 mg qd after finishing the prednisone taper. Breathing easier and seems more energetic. Continue routine regimen and 24 hour use of oxygen. Recheck in 3 months.  3. Strain of left trapezius muscle, subsequent encounter Pains in the left shoulder have resolved. Stretching exercises have helped ease  pains when it flares. Full ROM of neck without tenderness today. No neuropathic pain in UE. Recheck prn.       Dortha Kern, PA  [facility]

## 2017-10-25 DIAGNOSIS — H353133 Nonexudative age-related macular degeneration, bilateral, advanced atrophic without subfoveal involvement: Secondary | ICD-10-CM | POA: Diagnosis not present

## 2017-12-02 ENCOUNTER — Other Ambulatory Visit: Payer: Self-pay | Admitting: Internal Medicine

## 2017-12-02 DIAGNOSIS — J9611 Chronic respiratory failure with hypoxia: Secondary | ICD-10-CM

## 2018-01-10 ENCOUNTER — Other Ambulatory Visit: Payer: Self-pay | Admitting: Family Medicine

## 2018-01-30 ENCOUNTER — Other Ambulatory Visit: Payer: Self-pay | Admitting: Internal Medicine

## 2018-01-30 DIAGNOSIS — J9611 Chronic respiratory failure with hypoxia: Secondary | ICD-10-CM

## 2018-02-23 DIAGNOSIS — J42 Unspecified chronic bronchitis: Secondary | ICD-10-CM | POA: Diagnosis not present

## 2018-02-23 DIAGNOSIS — I5032 Chronic diastolic (congestive) heart failure: Secondary | ICD-10-CM | POA: Diagnosis not present

## 2018-02-23 DIAGNOSIS — I4891 Unspecified atrial fibrillation: Secondary | ICD-10-CM | POA: Diagnosis not present

## 2018-03-08 ENCOUNTER — Other Ambulatory Visit: Payer: Self-pay | Admitting: Internal Medicine

## 2018-03-08 ENCOUNTER — Other Ambulatory Visit: Payer: Self-pay

## 2018-03-08 DIAGNOSIS — J9611 Chronic respiratory failure with hypoxia: Secondary | ICD-10-CM

## 2018-03-13 ENCOUNTER — Ambulatory Visit (INDEPENDENT_AMBULATORY_CARE_PROVIDER_SITE_OTHER): Payer: Medicare Other | Admitting: Family Medicine

## 2018-03-13 ENCOUNTER — Other Ambulatory Visit: Payer: Self-pay

## 2018-03-13 ENCOUNTER — Encounter: Payer: Self-pay | Admitting: Family Medicine

## 2018-03-13 VITALS — BP 124/84 | HR 117 | Temp 98.2°F | Ht 69.0 in | Wt 300.0 lb

## 2018-03-13 DIAGNOSIS — J441 Chronic obstructive pulmonary disease with (acute) exacerbation: Secondary | ICD-10-CM

## 2018-03-13 DIAGNOSIS — I4819 Other persistent atrial fibrillation: Secondary | ICD-10-CM

## 2018-03-13 DIAGNOSIS — I5032 Chronic diastolic (congestive) heart failure: Secondary | ICD-10-CM | POA: Diagnosis not present

## 2018-03-13 MED ORDER — OSELTAMIVIR PHOSPHATE 75 MG PO CAPS: 75.0000 mg | ORAL_CAPSULE | Freq: Two times a day (BID) | ORAL | 0 refills | Status: AC

## 2018-03-13 MED ORDER — LEVOFLOXACIN 500 MG PO TABS: 500.0000 mg | ORAL_TABLET | Freq: Every day | ORAL | 0 refills | Status: DC

## 2018-03-13 MED ORDER — PREDNISONE 10 MG PO TABS: ORAL_TABLET | ORAL | 0 refills | Status: DC

## 2018-03-13 MED ORDER — ALBUTEROL SULFATE HFA 108 (90 BASE) MCG/ACT IN AERS: 2.0000 | INHALATION_SPRAY | Freq: Two times a day (BID) | RESPIRATORY_TRACT | 6 refills | Status: AC

## 2018-03-13 NOTE — Progress Notes (Signed)
Patient: [name] Male    DOB: 1950-11-24   67 y.o.   MRN: [id number] Visit Date: 03/13/2018  Today's Provider: Dortha Kern, PA   Chief Complaint  Patient presents with  . Cough    congestion, fever, SOB, wheezing   Subjective:    HPI  Pt reports he has a lot of congestion with brownish red mucus, and had fever on 03/12/18 of 101 and he took six 10 mg prednisone and his breathing seemed to get better and by using his Bipap machine.  He was having sweats.  His daughter passed away last 09-23-22 morning and had a lot of people in and out of their home and he is not sure if he caught something from someone. Worried about being exposed to the flu. Dr. Lady Gary (cardiologist) treating for CHF with Atrial Fibrillation with Furosemide and Digoxin.     Past Medical History:  Diagnosis Date  . Anxiety   . Arthritis    in back and knees  . Asthma   . CHF (congestive heart failure) (HCC)   . COPD (chronic obstructive pulmonary disease) (HCC)   . Dyspnea   . Hypertension   . Obesity    Past Surgical History:  Procedure Laterality Date  . APPENDECTOMY  1974  . CHOLECYSTECTOMY  1974  . NASAL SINUS SURGERY  [phone]  . TONSILLECTOMY AND ADENOIDECTOMY  1970  . VASECTOMY  1979   Family History  Problem Relation Age of Onset  . Heart disease Mother   . Congestive Heart Failure Father    Allergies  Allergen Reactions  . Promethazine Other (See Comments)    Caused pt to become disoriented after receiving 6.25mg  IV. Caused pt to become disoriented after receiving 6.25mg  IV.  Marland Kitchen Aspirin     Other reaction(s): Asthma flare up  . Sulfa Antibiotics     Current Outpatient Medications:  .  ADVAIR DISKUS 500-50 MCG/DOSE AEPB, INHALE 1 PUFF TWICE DAILY. RINSE MOUTH AFTER EACH USE, Disp: 60 each, Rfl: 10 .  albuterol (PROVENTIL HFA;VENTOLIN HFA) 108 (90 BASE) MCG/ACT inhaler, Inhale 2 puffs into the lungs 2 (two) times daily., Disp: , Rfl:  .  COMBIVENT RESPIMAT 20-100  MCG/ACT AERS respimat, USE ONE PUFF FOUR TIMES DAILY, Disp: 4 g, Rfl: 12 .  diltiazem (CARDIZEM CD) 300 MG 24 hr capsule, Take 300 mg by mouth daily. , Disp: , Rfl:  .  furosemide (LASIX) 40 MG tablet, TAKE ONE TABLET TWICE DAILY AS NEEDED, Disp: 60 tablet, Rfl: 6 .  ibuprofen (ADVIL,MOTRIN) 200 MG tablet, Take 1 tablet by mouth as needed., Disp: , Rfl:  .  ipratropium-albuterol (DUONEB) 0.5-2.5 (3) MG/3ML SOLN, Take 3 mLs by nebulization every 4 (four) hours as needed., Disp: , Rfl:  .  montelukast (SINGULAIR) 10 MG tablet, TAKE ONE TABLET AT BEDTIME, Disp: 30 tablet, Rfl: 11 .  morphine (ROXANOL) 20 MG/ML concentrated solution, Take 20 mg by mouth every 4 (four) hours as needed for shortness of breath. , Disp: , Rfl:  .  MULTIPLE VITAMIN PO, Take 1 tablet by mouth daily., Disp: , Rfl:  .  Multiple Vitamins-Minerals (ICAPS AREDS 2) CAPS, Take 2 each by mouth every 12 (twelve) hours., Disp: , Rfl:  .  potassium chloride SA (K-DUR,KLOR-CON) 20 MEQ tablet, , Disp: , Rfl:  .  predniSONE (DELTASONE) 10 MG tablet, TAKE 1 TABLET BY MOUTH ONCE A DAY, Disp: 30 tablet, Rfl: 0 .  ranitidine (ZANTAC) 150 MG tablet, TAKE ONE TABLET  EVERY 12 HOURS, Disp: 60 tablet, Rfl: 5 .  digoxin (LANOXIN) 0.25 MG tablet, Take 0.25 mg by mouth daily., Disp: , Rfl:   Review of Systems  Constitutional: Positive for fever. Negative for activity change, appetite change, chills, diaphoresis, fatigue and unexpected weight change.  HENT: Positive for congestion. Negative for dental problem, drooling, ear discharge, ear pain, facial swelling, hearing loss, mouth sores, nosebleeds, postnasal drip, rhinorrhea, sinus pressure, sinus pain, sneezing, sore throat, tinnitus, trouble swallowing and voice change.   Eyes: Negative.   Respiratory: Positive for cough, chest tightness, shortness of breath and wheezing. Negative for apnea, choking and stridor.   Cardiovascular: Negative.   Gastrointestinal: Negative.   Endocrine: Negative.     Genitourinary: Negative.   Musculoskeletal: Negative.   Skin: Negative.   Allergic/Immunologic: Negative.   Neurological: Negative.   Hematological: Negative.   Psychiatric/Behavioral: Negative.    Social History   Tobacco Use  . Smoking status: Former Smoker    Types: Cigarettes  . Smokeless tobacco: Never Used  . Tobacco comment: quit in 2003  Substance Use Topics  . Alcohol use: No    Alcohol/week: 0.0 standard drinks   Objective:   BP 124/84 (BP Location: Right Arm, Patient Position: Sitting, Cuff Size: Large)   Pulse (!) 117   Temp 98.2 F (36.8 C) (Oral)   Ht 5\' 9"  (1.753 m)   Wt 300 lb (136.1 kg)   SpO2 95% Comment: on 2 liters of oxygen  BMI 44.30 kg/m  Vitals:   03/13/18 1408  BP: 124/84  Pulse: (!) 117  Temp: 98.2 F (36.8 C)  TempSrc: Oral  SpO2: 95%  Weight: 300 lb (136.1 kg)  Height: 5\' 9"  (1.753 m)   Physical Exam  Constitutional: He is oriented to person, place, and time. He appears well-developed and well-nourished. No distress.  HENT:  Head: Normocephalic and atraumatic.  Right Ear: Hearing normal.  Left Ear: Hearing normal.  Nose: Nose normal.  Eyes: Conjunctivae and lids are normal. Right eye exhibits no discharge. Left eye exhibits no discharge. No scleral icterus.  Cardiovascular: Normal rate and regular rhythm.  Slight tachycardic.  Pulmonary/Chest: He is in respiratory distress. He has wheezes.  Abdominal: Soft. Bowel sounds are normal.  Musculoskeletal: Normal range of motion. He exhibits edema.  Pitting edema 2-3+ both ankles.  Neurological: He is alert and oriented to person, place, and time.  Skin: Skin is intact. No lesion and no rash noted.  Psychiatric: He has a normal mood and affect. His speech is normal and behavior is normal. Thought content normal.      Assessment & Plan:     1. Chronic asthmatic bronchitis with acute exacerbation (HCC) Had recurrence of cough, sputum production, wheeze, dyspnea and fever on 03-12-18.  Has had a great deal of visitors recently after daughter's death. Worried about exacerbation of asthmatic bronchitis with fever up to 101 yesterday. Refuses labs and definitely does NOT want to go to the hospital/ER. Will make Tamiflu available if exposed to the influenza. Refilled the Levaquin and 12 Prednisone 10 mg taper. Promises to recheck and get labs if no better in a week. May add normal saline (3 cc) to the Duoneb by a nebulizer QID at home prn for sticky sputum and switch Combivent to Ventolin-HFA QID prn. - oseltamivir (TAMIFLU) 75 MG capsule; Take 1 capsule (75 mg total) by mouth 2 (two) times daily.  Dispense: 10 capsule; Refill: 0 - predniSONE (DELTASONE) 10 MG tablet; Taper by one table every other  day starting at 6 tablets by mouth (6,6,5,5,4,4,3,3,2,2,1,1)  Dispense: 42 tablet; Refill: 0 - levofloxacin (LEVAQUIN) 500 MG tablet; Take 1 tablet (500 mg total) by mouth daily.  Dispense: 7 tablet; Refill: 0  2. Chronic diastolic congestive heart failure (HCC) Followed by Dr. Lady Gary (cardiologist). Continue Digoxin 0.25 mg qd with Furosemide 40 mg BID and KCL 20 meq BID. Refuses labs today. Wants to recheck with cardiologist.  3. Persistent atrial fibrillation Heart rate not irregular today but tachycardic. Continues Diltiazem 300 mg qd. Will follow up with with Dr. Lady Gary soon.       Dortha Kern, PA  [facility]

## 2018-03-20 ENCOUNTER — Telehealth: Payer: Self-pay | Admitting: Family Medicine

## 2018-03-20 ENCOUNTER — Telehealth: Payer: Self-pay

## 2018-03-20 ENCOUNTER — Other Ambulatory Visit: Payer: Self-pay | Admitting: Family Medicine

## 2018-03-20 DIAGNOSIS — I5032 Chronic diastolic (congestive) heart failure: Secondary | ICD-10-CM | POA: Diagnosis not present

## 2018-03-20 DIAGNOSIS — K2100 Gastro-esophageal reflux disease with esophagitis, without bleeding: Secondary | ICD-10-CM

## 2018-03-20 DIAGNOSIS — J441 Chronic obstructive pulmonary disease with (acute) exacerbation: Secondary | ICD-10-CM

## 2018-03-20 DIAGNOSIS — I4891 Unspecified atrial fibrillation: Secondary | ICD-10-CM | POA: Diagnosis not present

## 2018-03-20 DIAGNOSIS — J42 Unspecified chronic bronchitis: Secondary | ICD-10-CM | POA: Diagnosis not present

## 2018-03-20 DIAGNOSIS — G4733 Obstructive sleep apnea (adult) (pediatric): Secondary | ICD-10-CM | POA: Diagnosis not present

## 2018-03-20 MED ORDER — FAMOTIDINE 20 MG PO TABS: 20.0000 mg | ORAL_TABLET | Freq: Two times a day (BID) | ORAL | 3 refills | Status: AC

## 2018-03-20 MED ORDER — SODIUM CHLORIDE 0.9 % IN NEBU: 3.0000 mL | INHALATION_SOLUTION | RESPIRATORY_TRACT | 12 refills | Status: AC | PRN

## 2018-03-20 NOTE — Telephone Encounter (Signed)
Arbutus LeasJodey [facility]  [phone]  Pt says Maurine MinisterDennis wants him to start using saline.   The pharmacy said they have - Saline 3%.  They will need a prescribption to fill the Rx.  Please advise.  Thanks, [employer]

## 2018-03-20 NOTE — Progress Notes (Signed)
May use 3 cc vial of 0.9% saline in nebulizer prn dry wheezing or with Duoneb TID.

## 2018-03-20 NOTE — Progress Notes (Signed)
Ranitidine not available at pharmacies at the present. Will switch to Famotidine 20 mg 1-2 times a day prn for reflux esophagitis.

## 2018-03-20 NOTE — Telephone Encounter (Signed)
Sent Famotidine 20 mg one tablet 1-2 times a day as needed for reflux symptoms since Ranitidine not available at pharmacies at the present.

## 2018-03-20 NOTE — Telephone Encounter (Signed)
Sent prescription for the 3 cc 0.9% saline vials to use in nebulizer for dry wheezing or with Duoneb TID.

## 2018-03-20 NOTE — Telephone Encounter (Signed)
Sent note to [employer]

## 2018-03-20 NOTE — Telephone Encounter (Signed)
Please  Review.  dbs

## 2018-03-20 NOTE — Telephone Encounter (Signed)
Please review.  dbs 

## 2018-03-21 NOTE — Telephone Encounter (Signed)
Pt advised of medication sent to pharmacy.  dbs

## 2018-04-07 ENCOUNTER — Other Ambulatory Visit: Payer: Self-pay | Admitting: Family Medicine

## 2018-05-04 ENCOUNTER — Ambulatory Visit (INDEPENDENT_AMBULATORY_CARE_PROVIDER_SITE_OTHER): Payer: Medicare Other | Admitting: Family Medicine

## 2018-05-04 ENCOUNTER — Encounter: Payer: Self-pay | Admitting: Family Medicine

## 2018-05-04 VITALS — BP 144/64 | HR 86 | Temp 98.5°F | Resp 18

## 2018-05-04 DIAGNOSIS — J432 Centrilobular emphysema: Secondary | ICD-10-CM

## 2018-05-04 DIAGNOSIS — G4733 Obstructive sleep apnea (adult) (pediatric): Secondary | ICD-10-CM | POA: Diagnosis not present

## 2018-05-04 DIAGNOSIS — I5032 Chronic diastolic (congestive) heart failure: Secondary | ICD-10-CM

## 2018-05-04 MED ORDER — MOMETASONE FUROATE 200 MCG/ACT IN AERO: 2.0000 | INHALATION_SPRAY | Freq: Two times a day (BID) | RESPIRATORY_TRACT | 0 refills | Status: DC

## 2018-05-04 MED ORDER — PREDNISONE 10 MG PO TABS: ORAL_TABLET | ORAL | 0 refills | Status: DC

## 2018-05-04 MED ORDER — METHYLPREDNISOLONE ACETATE 40 MG/ML IJ SUSP
40.0000 mg | Freq: Once | INTRAMUSCULAR | Status: AC
  Administered 2018-05-04: 40 mg via INTRAMUSCULAR

## 2018-05-04 NOTE — Progress Notes (Addendum)
Patient: [name] Male    DOB: [birth year]   68 y.o.   MRN: [id number] Visit Date: 05/04/2018  Today's Provider: Dortha Kernennis Shwanda Soltis, PA   Chief Complaint  Patient presents with  . Follow-up   Subjective:     HPI   Chronic asthmatic bronchitis with acute exacerbation (HCC) From 03/13/2018-Refilled the Levaquin and 12 Prednisone 10 mg taper. Promises to recheck and get labs if no better in a week. May add normal saline (3 cc) to the Duoneb by a nebulizer QID at home prn for sticky sputum and switch Combivent to Ventolin-HFA QID prn.  Patient states he has a slightly productive cough. Patient states he is having a hard time coughing up mucus.   Allergies  Allergen Reactions  . Promethazine Other (See Comments)    Caused pt to become disoriented after receiving 6.25mg  IV. Caused pt to become disoriented after receiving 6.25mg  IV.  Marland Kitchen. Aspirin     Other reaction(s): Asthma flare up  . Sulfa Antibiotics      Current Outpatient Medications:  .  ADVAIR DISKUS 500-50 MCG/DOSE AEPB, INHALE 1 PUFF TWICE DAILY. RINSE MOUTH AFTER EACH USE, Disp: 60 each, Rfl: 10 .  albuterol (PROVENTIL HFA;VENTOLIN HFA) 108 (90 Base) MCG/ACT inhaler, Inhale 2 puffs into the lungs 2 (two) times daily., Disp: 18 g, Rfl: 6 .  digoxin (LANOXIN) 0.25 MG tablet, Take 0.25 mg by mouth daily., Disp: , Rfl:  .  diltiazem (CARDIZEM CD) 300 MG 24 hr capsule, Take 300 mg by mouth daily. , Disp: , Rfl:  .  famotidine (PEPCID) 20 MG tablet, Take 1 tablet (20 mg total) by mouth 2 (two) times daily., Disp: 60 tablet, Rfl: 3 .  furosemide (LASIX) 40 MG tablet, TAKE ONE TABLET TWICE DAILY AS NEEDED, Disp: 60 tablet, Rfl: 6 .  ibuprofen (ADVIL,MOTRIN) 200 MG tablet, Take 1 tablet by mouth as needed., Disp: , Rfl:  .  ipratropium-albuterol (DUONEB) 0.5-2.5 (3) MG/3ML SOLN, Take 3 mLs by nebulization every 4 (four) hours as needed., Disp: , Rfl:  .  metoprolol tartrate (LOPRESSOR) 50 MG tablet, Take by mouth.,  Disp: , Rfl:  .  montelukast (SINGULAIR) 10 MG tablet, TAKE ONE TABLET AT BEDTIME, Disp: 30 tablet, Rfl: 10 .  morphine (ROXANOL) 20 MG/ML concentrated solution, Take 20 mg by mouth every 4 (four) hours as needed for shortness of breath. , Disp: , Rfl:  .  MULTIPLE VITAMIN PO, Take 1 tablet by mouth daily., Disp: , Rfl:  .  Multiple Vitamins-Minerals (ICAPS AREDS 2) CAPS, Take 2 each by mouth every 12 (twelve) hours., Disp: , Rfl:  .  oseltamivir (TAMIFLU) 75 MG capsule, Take 1 capsule (75 mg total) by mouth 2 (two) times daily., Disp: 10 capsule, Rfl: 0 .  potassium chloride SA (K-DUR,KLOR-CON) 20 MEQ tablet, , Disp: , Rfl:  .  sodium chloride 0.9 % nebulizer solution, Take 3 mLs by nebulization as needed for wheezing. May be used alone or with Duoneb up to TID., Disp: 90 mL, Rfl: 12  Review of Systems  Constitutional: Negative for appetite change, chills and fever.  Respiratory: Positive for cough, chest tightness, shortness of breath and wheezing.   Cardiovascular: Negative for chest pain and palpitations.  Gastrointestinal: Negative for abdominal pain, nausea and vomiting.   Past Medical History:  Diagnosis Date  . Anxiety   . Arthritis    in back and knees  . Asthma   . CHF (congestive heart failure) (HCC)   .  COPD (chronic obstructive pulmonary disease) (HCC)   . Dyspnea   . Hypertension   . Obesity      Social History   Tobacco Use  . Smoking status: Former Smoker    Types: Cigarettes  . Smokeless tobacco: Never Used  . Tobacco comment: quit in 2003  Substance Use Topics  . Alcohol use: No    Alcohol/week: 0.0 standard drinks      Objective:   BP (!) 144/64 (BP Location: Left Arm, Patient Position: Sitting, Cuff Size: Large)   Pulse 86   Temp 98.5 F (36.9 C) (Oral)   Resp 18   SpO2 95%  Vitals:   05/04/18 1433  BP: (!) 144/64  Pulse: 86  Resp: 18  Temp: 98.5 F (36.9 C)  TempSrc: Oral  SpO2: 95%     Physical Exam Constitutional:      General: He  is not in acute distress.    Appearance: He is well-developed.  HENT:     Head: Normocephalic and atraumatic.     Right Ear: Hearing and tympanic membrane normal.     Left Ear: Hearing and tympanic membrane normal.     Nose: Nose normal.     Mouth/Throat:     Pharynx: Oropharynx is clear.  Eyes:     General: Lids are normal. No scleral icterus.       Right eye: No discharge.        Left eye: No discharge.     Conjunctiva/sclera: Conjunctivae normal.  Neck:     Musculoskeletal: Neck supple.  Cardiovascular:     Rate and Rhythm: Normal rate and regular rhythm.  Pulmonary:     Effort: Respiratory distress present.     Breath sounds: Wheezing and rhonchi present.  Abdominal:     Palpations: Abdomen is soft.  Musculoskeletal: Normal range of motion.     Right lower leg: Edema present.     Left lower leg: Edema present.     Comments: 2-3+ edema of feet and lower legs to mid calf level.  Skin:    Findings: No lesion or rash.  Neurological:     Mental Status: He is alert and oriented to person, place, and time.  Psychiatric:        Speech: Speech normal.        Behavior: Behavior normal.        Thought Content: Thought content normal.       Assessment & Plan    1. Centrilobular emphysema (HCC) Shortness of breath and clear "watery" sputum increased recently. Was treated with prednisone and Levaquin 03-13-18 when cough was thick and more productive. Feels the Advair is not working as well to control dyspnea or wheezing. Still taking oxygen 3 LPM 24 hours a day with pulse oximetry at home being 78% in the morning when he first gets up. Continues to use the Duoneb with a vial of saline 4-5 times a day and intermittent Albuterol-HFA prn. Will give Depo-Medrol 40 mg IM and switch the Advair to Asmanex 200 mcg 2 puffs BID. Check CBC with diff and given prednisone taper to have on hand at home if needed. Prognosis very poor. Recheck pending lab reports. - CBC with Differential/Platelet -  Mometasone Furoate (ASMANEX HFA) 200 MCG/ACT AERO; Inhale 2 puffs into the lungs 2 (two) times daily after a meal.  Dispense: 1 Inhaler; Refill: 0 - methylPREDNISolone acetate (DEPO-MEDROL) injection 40 mg - predniSONE (DELTASONE) 10 MG tablet; Taper by one table every other day starting  at 6 tablets by mouth (6,6,5,5,4,4,3,3,2,2,1,1)  Dispense: 42 tablet; Refill: 0  2. Chronic diastolic congestive heart failure (HCC) 2-3+ pitting edema to mid calf level bilaterally. Encouraged to increase Lasix to 40 mg 2 tablets each morning and continue KCL 20 meq BID. Last labs 03-20-18 showed magnesium 2.1 and K+ 4.8 with follow up visit with Dr. Gwen Pounds (cardiologist).  3. Obstructive apnea Continues BiPAP with oxygen and planning to get on a diet for weight loss this year. Pulse oximetry drops to 78% walking short distances without oxygen - back up to 95% on 3 LPM oxygen at rest today. Still seeing good benefit of BiPAP with oxygen and should continue use. Presently using the BiPAP with oxygen through the night while sleeping and intermittently during the day if dyspnea and hypoxia not controlled by oxygen and inhalation therapies. This regimen has helped limit hospitalizations and use of ventilators as required in past admissions.     Dortha Kern, PA  [facility]

## 2018-05-05 ENCOUNTER — Other Ambulatory Visit: Payer: Self-pay

## 2018-05-05 ENCOUNTER — Other Ambulatory Visit: Payer: Self-pay | Admitting: Family Medicine

## 2018-05-05 DIAGNOSIS — J441 Chronic obstructive pulmonary disease with (acute) exacerbation: Secondary | ICD-10-CM

## 2018-05-05 LAB — CBC WITH DIFFERENTIAL/PLATELET
BASOS ABS: 0.1 10*3/uL (ref 0.0–0.2)
Basos: 0 %
EOS (ABSOLUTE): 0 10*3/uL (ref 0.0–0.4)
Eos: 0 %
HEMOGLOBIN: 12.7 g/dL — AB (ref 13.0–17.7)
Hematocrit: 37.6 % (ref 37.5–51.0)
Immature Grans (Abs): 0.2 10*3/uL — ABNORMAL HIGH (ref 0.0–0.1)
Immature Granulocytes: 1 %
LYMPHS ABS: 1.1 10*3/uL (ref 0.7–3.1)
LYMPHS: 6 %
MCH: 30.2 pg (ref 26.6–33.0)
MCHC: 33.8 g/dL (ref 31.5–35.7)
MCV: 90 fL (ref 79–97)
MONOCYTES: 5 %
Monocytes Absolute: 1 10*3/uL — ABNORMAL HIGH (ref 0.1–0.9)
Neutrophils Absolute: 15.7 10*3/uL — ABNORMAL HIGH (ref 1.4–7.0)
Neutrophils: 88 %
PLATELETS: 245 10*3/uL (ref 150–450)
RBC: 4.2 x10E6/uL (ref 4.14–5.80)
RDW: 17.1 % — ABNORMAL HIGH (ref 12.3–15.4)
WBC: 18 10*3/uL — ABNORMAL HIGH (ref 3.4–10.8)

## 2018-05-05 MED ORDER — CEFDINIR 300 MG PO CAPS: 300.0000 mg | ORAL_CAPSULE | Freq: Two times a day (BID) | ORAL | 0 refills | Status: DC

## 2018-05-05 NOTE — Telephone Encounter (Signed)
Can't use the Azithromycin because it can increase digoxin blood level. Will switch to the [facility] for the bronchitis.

## 2018-05-05 NOTE — Telephone Encounter (Signed)
Patient advised.KW 

## 2018-05-05 NOTE — Telephone Encounter (Signed)
I tried sending in medication but there seems to be a interaction with Digoxin. KW

## 2018-05-05 NOTE — Telephone Encounter (Signed)
Patient was advised he states that he uses [facility]. KW

## 2018-05-05 NOTE — Telephone Encounter (Signed)
-----   Message from Tamsen Roers, [place] sent at 05/05/2018  8:01 AM EST ----- WBC count very high. Need Z-pak to help with congestion (which pharmacy?).

## 2018-05-22 ENCOUNTER — Telehealth: Payer: Self-pay | Admitting: Internal Medicine

## 2018-05-22 NOTE — Telephone Encounter (Signed)
CPAP supply order came in and patient was called because he had not been seen in and year and was scheduled an appointment and then he called back to say that he was cancelling appointment and would no longer be out patient because he did not think he should be seen for supplies.jw

## 2018-05-29 ENCOUNTER — Ambulatory Visit: Payer: Medicare Other | Admitting: Adult Health

## 2018-05-29 ENCOUNTER — Telehealth: Payer: Self-pay | Admitting: Family Medicine

## 2018-05-29 ENCOUNTER — Other Ambulatory Visit: Payer: Self-pay | Admitting: Family Medicine

## 2018-05-29 DIAGNOSIS — J432 Centrilobular emphysema: Secondary | ICD-10-CM

## 2018-05-29 MED ORDER — PREDNISONE 10 MG PO TABS: 10.0000 mg | ORAL_TABLET | Freq: Every day | ORAL | 2 refills | Status: AC

## 2018-05-29 MED ORDER — MOMETASONE FUROATE 200 MCG/ACT IN AERO: 2.0000 | INHALATION_SPRAY | Freq: Two times a day (BID) | RESPIRATORY_TRACT | 3 refills | Status: AC

## 2018-05-29 NOTE — Telephone Encounter (Signed)
Patient is wanting a prescription for Asmanex HFA. Said he got one sample at last OV.  Also is not seeing Dr. Park Breed anymore and wants Maurine Minister to go back to prescribing Prednisone 10 mg. For him.   Pt said he takes 1 daily.   [facility]

## 2018-05-29 NOTE — Telephone Encounter (Signed)
Patient advise as directed below. 

## 2018-05-29 NOTE — Telephone Encounter (Signed)
Sent to [facility] and should recheck pulmonary status in 3 months.

## 2018-05-29 NOTE — Telephone Encounter (Signed)
Please Review

## 2018-07-20 ENCOUNTER — Telehealth: Payer: Self-pay | Admitting: Family Medicine

## 2018-07-20 ENCOUNTER — Other Ambulatory Visit: Payer: Self-pay | Admitting: Family Medicine

## 2018-07-20 ENCOUNTER — Telehealth: Payer: Self-pay

## 2018-07-20 DIAGNOSIS — J441 Chronic obstructive pulmonary disease with (acute) exacerbation: Secondary | ICD-10-CM

## 2018-07-20 MED ORDER — CEFDINIR 300 MG PO CAPS: 300.0000 mg | ORAL_CAPSULE | Freq: Two times a day (BID) | ORAL | 0 refills | Status: AC

## 2018-07-20 NOTE — Telephone Encounter (Signed)
Patient called the office reporting that his 02 this morning when he woke up was 68% on 2L oxygen. Patient gave himself a breathing treatment, increased his 02 to 3L, and used his inhalers. His 02 saturation is currently  94% on 3L oxygen. Patient has had a productive cough with thick green mucus for 2 weeks, he feels very short of breath, has had sweats and fever (patient says a temperature of 98 is a fever for him). Patient has not traveled anywhere, or come in contact with anyone who has traveled or been sick. I offered patient an appointment today at 1:20pm. Patient refused to come in. He states that the government is telling everyone to stay home, and the doctors could treat you over the phone. He also mentioned that the Asmanex that you prescribed mad him very hoarse, so he switched back to using Albuterol. Please advise.

## 2018-07-20 NOTE — Telephone Encounter (Signed)
°  Opened in error - [facility] °

## 2018-07-20 NOTE — Telephone Encounter (Signed)
Pt called back.  Please to call pt to advise him asap.  Thanks, [employer]

## 2018-07-20 NOTE — Telephone Encounter (Signed)
Will get antibiotic refill at [facility]. He may need to increase Prednisone 10 mg to 2 BID for 3 days then go back to one daily. Use inhaled therapies as usual when needed and oxygen. Let us know if he is not improving.

## 2018-07-20 NOTE — Telephone Encounter (Signed)
Patient was advised. Expressed understanding.  

## 2018-07-21 ENCOUNTER — Telehealth: Payer: Self-pay | Admitting: Family Medicine

## 2018-07-21 NOTE — Telephone Encounter (Signed)
Note was addenda by Maurine Minister and was faxed back to [facility].

## 2018-07-21 NOTE — Telephone Encounter (Signed)
Georgiann Cocker [phone]  Ext. [phone]  Calling guarding pt's Cpap machine replacement.  Where added chart notes have been added, there will will the words written "addendum by" will need to be written.  If any questions please call Boyd Kerbs.  Thanks, [employer]

## 2018-07-21 NOTE — Telephone Encounter (Signed)
Please print note again so I can label it as they need.

## 2018-07-21 NOTE — Telephone Encounter (Signed)
Please advise 

## 2018-07-23 ENCOUNTER — Other Ambulatory Visit: Payer: Self-pay

## 2018-07-23 ENCOUNTER — Inpatient Hospital Stay
Admission: EM | Admit: 2018-07-23 | Discharge: 2018-08-02 | DRG: 870 | Disposition: E | Payer: Medicare Other | Attending: Internal Medicine | Admitting: Internal Medicine

## 2018-07-23 ENCOUNTER — Encounter: Payer: Self-pay | Admitting: Emergency Medicine

## 2018-07-23 ENCOUNTER — Inpatient Hospital Stay: Payer: Medicare Other

## 2018-07-23 ENCOUNTER — Emergency Department: Payer: Medicare Other

## 2018-07-23 DIAGNOSIS — I110 Hypertensive heart disease with heart failure: Secondary | ICD-10-CM | POA: Diagnosis present

## 2018-07-23 DIAGNOSIS — D62 Acute posthemorrhagic anemia: Secondary | ICD-10-CM | POA: Diagnosis present

## 2018-07-23 DIAGNOSIS — J9621 Acute and chronic respiratory failure with hypoxia: Secondary | ICD-10-CM | POA: Diagnosis present

## 2018-07-23 DIAGNOSIS — R0902 Hypoxemia: Secondary | ICD-10-CM | POA: Diagnosis not present

## 2018-07-23 DIAGNOSIS — J439 Emphysema, unspecified: Secondary | ICD-10-CM | POA: Diagnosis not present

## 2018-07-23 DIAGNOSIS — D649 Anemia, unspecified: Secondary | ICD-10-CM

## 2018-07-23 DIAGNOSIS — E878 Other disorders of electrolyte and fluid balance, not elsewhere classified: Secondary | ICD-10-CM | POA: Diagnosis present

## 2018-07-23 DIAGNOSIS — J9690 Respiratory failure, unspecified, unspecified whether with hypoxia or hypercapnia: Secondary | ICD-10-CM | POA: Diagnosis not present

## 2018-07-23 DIAGNOSIS — Z87891 Personal history of nicotine dependence: Secondary | ICD-10-CM

## 2018-07-23 DIAGNOSIS — K922 Gastrointestinal hemorrhage, unspecified: Secondary | ICD-10-CM

## 2018-07-23 DIAGNOSIS — J156 Pneumonia due to other aerobic Gram-negative bacteria: Secondary | ICD-10-CM | POA: Diagnosis present

## 2018-07-23 DIAGNOSIS — R404 Transient alteration of awareness: Secondary | ICD-10-CM | POA: Diagnosis not present

## 2018-07-23 DIAGNOSIS — R0602 Shortness of breath: Secondary | ICD-10-CM | POA: Diagnosis not present

## 2018-07-23 DIAGNOSIS — Z79899 Other long term (current) drug therapy: Secondary | ICD-10-CM | POA: Diagnosis not present

## 2018-07-23 DIAGNOSIS — I5033 Acute on chronic diastolic (congestive) heart failure: Secondary | ICD-10-CM | POA: Diagnosis present

## 2018-07-23 DIAGNOSIS — J9601 Acute respiratory failure with hypoxia: Secondary | ICD-10-CM

## 2018-07-23 DIAGNOSIS — B974 Respiratory syncytial virus as the cause of diseases classified elsewhere: Secondary | ICD-10-CM | POA: Diagnosis present

## 2018-07-23 DIAGNOSIS — J441 Chronic obstructive pulmonary disease with (acute) exacerbation: Secondary | ICD-10-CM | POA: Diagnosis present

## 2018-07-23 DIAGNOSIS — R6520 Severe sepsis without septic shock: Secondary | ICD-10-CM | POA: Diagnosis present

## 2018-07-23 DIAGNOSIS — Z66 Do not resuscitate: Secondary | ICD-10-CM | POA: Diagnosis present

## 2018-07-23 DIAGNOSIS — G4733 Obstructive sleep apnea (adult) (pediatric): Secondary | ICD-10-CM | POA: Diagnosis present

## 2018-07-23 DIAGNOSIS — R0689 Other abnormalities of breathing: Secondary | ICD-10-CM | POA: Diagnosis not present

## 2018-07-23 DIAGNOSIS — E875 Hyperkalemia: Secondary | ICD-10-CM | POA: Diagnosis present

## 2018-07-23 DIAGNOSIS — J9622 Acute and chronic respiratory failure with hypercapnia: Secondary | ICD-10-CM | POA: Diagnosis present

## 2018-07-23 DIAGNOSIS — J210 Acute bronchiolitis due to respiratory syncytial virus: Secondary | ICD-10-CM | POA: Diagnosis not present

## 2018-07-23 DIAGNOSIS — J189 Pneumonia, unspecified organism: Secondary | ICD-10-CM

## 2018-07-23 DIAGNOSIS — Z7952 Long term (current) use of systemic steroids: Secondary | ICD-10-CM

## 2018-07-23 DIAGNOSIS — R739 Hyperglycemia, unspecified: Secondary | ICD-10-CM | POA: Diagnosis present

## 2018-07-23 DIAGNOSIS — Z20828 Contact with and (suspected) exposure to other viral communicable diseases: Secondary | ICD-10-CM

## 2018-07-23 DIAGNOSIS — Z9981 Dependence on supplemental oxygen: Secondary | ICD-10-CM | POA: Diagnosis not present

## 2018-07-23 DIAGNOSIS — J9600 Acute respiratory failure, unspecified whether with hypoxia or hypercapnia: Secondary | ICD-10-CM

## 2018-07-23 DIAGNOSIS — Z8249 Family history of ischemic heart disease and other diseases of the circulatory system: Secondary | ICD-10-CM | POA: Diagnosis not present

## 2018-07-23 DIAGNOSIS — J440 Chronic obstructive pulmonary disease with acute lower respiratory infection: Secondary | ICD-10-CM | POA: Diagnosis present

## 2018-07-23 DIAGNOSIS — I509 Heart failure, unspecified: Secondary | ICD-10-CM | POA: Diagnosis not present

## 2018-07-23 DIAGNOSIS — R918 Other nonspecific abnormal finding of lung field: Secondary | ICD-10-CM | POA: Diagnosis not present

## 2018-07-23 DIAGNOSIS — J121 Respiratory syncytial virus pneumonia: Secondary | ICD-10-CM | POA: Diagnosis present

## 2018-07-23 DIAGNOSIS — A4189 Other specified sepsis: Secondary | ICD-10-CM | POA: Diagnosis present

## 2018-07-23 DIAGNOSIS — D638 Anemia in other chronic diseases classified elsewhere: Secondary | ICD-10-CM | POA: Diagnosis present

## 2018-07-23 DIAGNOSIS — R000 Tachycardia, unspecified: Secondary | ICD-10-CM | POA: Diagnosis not present

## 2018-07-23 DIAGNOSIS — Z9049 Acquired absence of other specified parts of digestive tract: Secondary | ICD-10-CM | POA: Diagnosis not present

## 2018-07-23 DIAGNOSIS — Z931 Gastrostomy status: Secondary | ICD-10-CM | POA: Diagnosis not present

## 2018-07-23 DIAGNOSIS — A419 Sepsis, unspecified organism: Secondary | ICD-10-CM | POA: Diagnosis present

## 2018-07-23 DIAGNOSIS — I10 Essential (primary) hypertension: Secondary | ICD-10-CM | POA: Diagnosis not present

## 2018-07-23 LAB — IRON AND TIBC
Iron: 70 ug/dL (ref 45–182)
Saturation Ratios: 19 % (ref 17.9–39.5)
TIBC: 372 ug/dL (ref 250–450)
UIBC: 302 ug/dL

## 2018-07-23 LAB — COMPREHENSIVE METABOLIC PANEL
ALBUMIN: 3.2 g/dL — AB (ref 3.5–5.0)
ALT: 34 U/L (ref 0–44)
AST: 33 U/L (ref 15–41)
Alkaline Phosphatase: 68 U/L (ref 38–126)
Anion gap: 9 (ref 5–15)
BUN: 23 mg/dL (ref 8–23)
CO2: 31 mmol/L (ref 22–32)
Calcium: 8.4 mg/dL — ABNORMAL LOW (ref 8.9–10.3)
Chloride: 95 mmol/L — ABNORMAL LOW (ref 98–111)
Creatinine, Ser: 0.82 mg/dL (ref 0.61–1.24)
GFR calc Af Amer: >60 mL/min (ref >60)
Glucose, Bld: 161 mg/dL — ABNORMAL HIGH (ref 70–99)
Potassium: 5.3 mmol/L — ABNORMAL HIGH (ref 3.5–5.1)
Sodium: 135 mmol/L (ref 135–145)
Total Bilirubin: 0.7 mg/dL (ref 0.3–1.2)
Total Protein: 6.9 g/dL (ref 6.5–8.1)

## 2018-07-23 LAB — RESPIRATORY PANEL BY PCR
Adenovirus: NOT DETECTED
Bordetella pertussis: NOT DETECTED
CHLAMYDOPHILA PNEUMONIAE-RVPPCR: NOT DETECTED
Coronavirus 229E: NOT DETECTED
Coronavirus HKU1: NOT DETECTED
Coronavirus NL63: NOT DETECTED
Coronavirus OC43: NOT DETECTED
Influenza A: NOT DETECTED
Influenza B: NOT DETECTED
MYCOPLASMA PNEUMONIAE-RVPPCR: NOT DETECTED
Metapneumovirus: NOT DETECTED
Parainfluenza Virus 1: NOT DETECTED
Parainfluenza Virus 2: NOT DETECTED
Parainfluenza Virus 3: NOT DETECTED
Parainfluenza Virus 4: NOT DETECTED
Respiratory Syncytial Virus: DETECTED — AB
Rhinovirus / Enterovirus: NOT DETECTED

## 2018-07-23 LAB — CBC WITH DIFFERENTIAL/PLATELET
Abs Immature Granulocytes: 0.1 10*3/uL — ABNORMAL HIGH (ref 0.00–0.07)
BASOS PCT: 0 %
Basophils Absolute: 0 10*3/uL (ref 0.0–0.1)
Eosinophils Absolute: 0 10*3/uL (ref 0.0–0.5)
Eosinophils Relative: 0 %
HCT: 26.3 % — ABNORMAL LOW (ref 39.0–52.0)
Hemoglobin: 7.9 g/dL — ABNORMAL LOW (ref 13.0–17.0)
Immature Granulocytes: 1 %
Lymphocytes Relative: 2 %
Lymphs Abs: 0.3 10*3/uL — ABNORMAL LOW (ref 0.7–4.0)
MCH: 29.7 pg (ref 26.0–34.0)
MCHC: 30 g/dL (ref 30.0–36.0)
MCV: 98.9 fL (ref 80.0–100.0)
Monocytes Absolute: 0.5 10*3/uL (ref 0.1–1.0)
Monocytes Relative: 3 %
Neutro Abs: 14.4 10*3/uL — ABNORMAL HIGH (ref 1.7–7.7)
Neutrophils Relative %: 94 %
Platelets: 198 10*3/uL (ref 150–400)
RBC: 2.66 MIL/uL — AB (ref 4.22–5.81)
RDW: 14.4 % (ref 11.5–15.5)
WBC: 15.3 10*3/uL — ABNORMAL HIGH (ref 4.0–10.5)
nRBC: 0 % (ref 0.0–0.2)

## 2018-07-23 LAB — TYPE AND SCREEN
ABO/RH(D): O NEG
ANTIBODY SCREEN: NEGATIVE

## 2018-07-23 LAB — GLUCOSE, CAPILLARY: Glucose-Capillary: 134 mg/dL — ABNORMAL HIGH (ref 70–99)

## 2018-07-23 LAB — CBC
HCT: 30.7 % — ABNORMAL LOW (ref 39.0–52.0)
Hemoglobin: 9.3 g/dL — ABNORMAL LOW (ref 13.0–17.0)
MCH: 29.8 pg (ref 26.0–34.0)
MCHC: 30.3 g/dL (ref 30.0–36.0)
MCV: 98.4 fL (ref 80.0–100.0)
Platelets: 219 10*3/uL (ref 150–400)
RBC: 3.12 MIL/uL — ABNORMAL LOW (ref 4.22–5.81)
RDW: 14.5 % (ref 11.5–15.5)
WBC: 20.2 10*3/uL — ABNORMAL HIGH (ref 4.0–10.5)
nRBC: 0 % (ref 0.0–0.2)

## 2018-07-23 LAB — RETICULOCYTES
Immature Retic Fract: 19.2 % — ABNORMAL HIGH (ref 2.3–15.9)
RBC.: 3.9 MIL/uL — AB (ref 4.22–5.81)
Retic Count, Absolute: 72.2 10*3/uL (ref 19.0–186.0)
Retic Ct Pct: 1.9 % (ref 0.4–3.1)

## 2018-07-23 LAB — FOLATE: Folate: 29 ng/mL (ref >5.9)

## 2018-07-23 LAB — FERRITIN: Ferritin: 62 ng/mL (ref 24–336)

## 2018-07-23 LAB — LACTIC ACID, PLASMA: Lactic Acid, Venous: 1.3 mmol/L (ref 0.5–1.9)

## 2018-07-23 LAB — BRAIN NATRIURETIC PEPTIDE: B NATRIURETIC PEPTIDE 5: 66 pg/mL (ref 0.0–100.0)

## 2018-07-23 LAB — TROPONIN I: TROPONIN I: 0.12 ng/mL — AB (ref <0.03)

## 2018-07-23 LAB — INFLUENZA PANEL BY PCR (TYPE A & B)
Influenza A By PCR: NEGATIVE
Influenza B By PCR: NEGATIVE

## 2018-07-23 LAB — PROCALCITONIN: Procalcitonin: <0.1 ng/mL

## 2018-07-23 MED ORDER — SODIUM CHLORIDE 0.9 % IV SOLN: INTRAVENOUS | Status: DC

## 2018-07-23 MED ORDER — CHLORHEXIDINE GLUCONATE 0.12% ORAL RINSE (MEDLINE KIT)
15.0000 mL | Freq: Two times a day (BID) | OROMUCOSAL | Status: DC
  Administered 2018-07-23 – 2018-07-27 (×9): 15 mL via OROMUCOSAL

## 2018-07-23 MED ORDER — SODIUM CHLORIDE 0.9 % IV BOLUS
1000.0000 mL | Freq: Once | INTRAVENOUS | Status: AC
  Administered 2018-07-23: 1000 mL via INTRAVENOUS

## 2018-07-23 MED ORDER — METHYLPREDNISOLONE SODIUM SUCC 125 MG IJ SOLR
80.0000 mg | Freq: Two times a day (BID) | INTRAMUSCULAR | Status: DC
  Administered 2018-07-23 – 2018-07-24 (×2): 80 mg via INTRAVENOUS
  Filled 2018-07-23 (×2): qty 2

## 2018-07-23 MED ORDER — SODIUM CHLORIDE 0.9 % IV SOLN
1.0000 g | INTRAVENOUS | Status: DC
  Filled 2018-07-23: qty 10

## 2018-07-23 MED ORDER — SODIUM CHLORIDE 0.9 % IV BOLUS: 1000.0000 mL | Freq: Once | INTRAVENOUS | Status: DC

## 2018-07-23 MED ORDER — DEXMEDETOMIDINE HCL IN NACL 400 MCG/100ML IV SOLN
0.4000 ug/kg/h | INTRAVENOUS | Status: DC
  Administered 2018-07-23: 0.4 ug/kg/h via INTRAVENOUS

## 2018-07-23 MED ORDER — ENOXAPARIN SODIUM 40 MG/0.4ML ~~LOC~~ SOLN
40.0000 mg | SUBCUTANEOUS | Status: DC
  Administered 2018-07-23: 40 mg via SUBCUTANEOUS
  Filled 2018-07-23: qty 0.4

## 2018-07-23 MED ORDER — MIDAZOLAM HCL 2 MG/2ML IJ SOLN
INTRAMUSCULAR | Status: AC
  Filled 2018-07-23: qty 2

## 2018-07-23 MED ORDER — CHLORHEXIDINE GLUCONATE CLOTH 2 % EX PADS
6.0000 | MEDICATED_PAD | Freq: Every day | CUTANEOUS | Status: DC
  Administered 2018-07-24 – 2018-07-28 (×5): 6 via TOPICAL

## 2018-07-23 MED ORDER — FENTANYL 2500MCG IN NS 250ML (10MCG/ML) PREMIX INFUSION
INTRAVENOUS | Status: AC
  Administered 2018-07-23: 250 ug/h via INTRAVENOUS
  Filled 2018-07-23: qty 250

## 2018-07-23 MED ORDER — SODIUM CHLORIDE 0.9 % IV SOLN
500.0000 mg | Freq: Once | INTRAVENOUS | Status: AC
  Administered 2018-07-23: 500 mg via INTRAVENOUS
  Filled 2018-07-23: qty 500

## 2018-07-23 MED ORDER — MIDAZOLAM HCL 2 MG/2ML IJ SOLN
2.0000 mg | Freq: Once | INTRAMUSCULAR | Status: AC
  Administered 2018-07-23: 2 mg via INTRAVENOUS

## 2018-07-23 MED ORDER — SODIUM CHLORIDE 0.9 % IV SOLN
500.0000 mg | INTRAVENOUS | Status: DC
  Administered 2018-07-24: 500 mg via INTRAVENOUS
  Filled 2018-07-23: qty 500

## 2018-07-23 MED ORDER — SODIUM CHLORIDE 0.9 % IV SOLN
1.0000 g | Freq: Once | INTRAVENOUS | Status: AC
  Administered 2018-07-23: 1 g via INTRAVENOUS
  Filled 2018-07-23: qty 10

## 2018-07-23 MED ORDER — IPRATROPIUM-ALBUTEROL 0.5-2.5 (3) MG/3ML IN SOLN
3.0000 mL | RESPIRATORY_TRACT | Status: DC
  Administered 2018-07-23 – 2018-07-24 (×5): 3 mL via RESPIRATORY_TRACT
  Filled 2018-07-23 (×5): qty 3

## 2018-07-23 MED ORDER — ORAL CARE MOUTH RINSE
15.0000 mL | OROMUCOSAL | Status: DC
  Administered 2018-07-23 – 2018-07-28 (×41): 15 mL via OROMUCOSAL

## 2018-07-23 MED ORDER — FENTANYL 2500MCG IN NS 250ML (10MCG/ML) PREMIX INFUSION
0.0000 ug/h | INTRAVENOUS | Status: DC
  Administered 2018-07-23: 250 ug/h via INTRAVENOUS
  Administered 2018-07-23: 375 ug/h via INTRAVENOUS
  Administered 2018-07-24 (×4): 400 ug/h via INTRAVENOUS
  Administered 2018-07-25: 275 ug/h via INTRAVENOUS
  Administered 2018-07-25: 400 ug/h via INTRAVENOUS
  Administered 2018-07-25: 350 ug/h via INTRAVENOUS
  Administered 2018-07-26 (×2): 200 ug/h via INTRAVENOUS
  Administered 2018-07-28: 350 ug/h via INTRAVENOUS
  Filled 2018-07-23 (×12): qty 250

## 2018-07-23 NOTE — ED Provider Notes (Signed)
[facility] Emergency Department Provider Note  ____________________________________________  Time seen: Approximately 1:37 PM  I have reviewed the triage vital signs and the nursing notes.   HISTORY  Chief Complaint Respiratory Distress  Level 5 caveat:  Portions of the history and physical were unable to be obtained due to respiratory distress   HPI [name] is a 68 y.o. male with a history of asthma, COPD on 2 L nasal cannula, diastolic CHF, hypertension, OSA on CPAP who presents for evaluation of shortness of breath.  Per EMS they were called to the house for shortness of breath and cough x24 hours.  Patient hypoxic to the 70s on his home oxygen.  Patient arrives on CPAP at 10 L of oxygen satting the low 80s.  Patient denies fever, recent travel to endemic areas of Cov[place] 75 or known exposures.  Past Medical History:  Diagnosis Date   Anxiety    Arthritis    in back and knees   Asthma    CHF (congestive heart failure) (HCC)    COPD (chronic obstructive pulmonary disease) (HCC)    Dyspnea    Hypertension    Obesity     Patient Active Problem List   Diagnosis Date Noted   PNA (pneumonia) 07/27/2018   Acute upper respiratory infection, unspecified 05/12/2017   Vitamin D deficiency, unspecified 03/28/2017   Osteoarthritis of both knees 03/28/2017   DDD (degenerative disc disease), lumbar 03/28/2017   Elevated rheumatoid factor 01/12/2017   Arthralgia 01/12/2017   Influenzal pneumonia 05/17/2016   A-fib (HCC) 10/03/2014   Chronic asthmatic bronchitis with acute exacerbation (HCC) 10/03/2014   Chronic respiratory failure (HCC) 10/03/2014   CAFL (chronic airflow limitation) (HCC) 10/03/2014   Cervical osteoarthritis 10/03/2014   Chronic obstructive pulmonary emphysema (HCC) 10/03/2014   Fatigue 10/03/2014   History of atrial fibrillation 10/03/2014   Obstructive apnea 10/03/2014   Disturbance of skin  sensation 10/03/2014   Edema, peripheral 10/03/2014   CCF (congestive cardiac failure) (HCC) 03/26/2014   Chronic obstructive pulmonary disease (HCC) 09/21/2013   Carbuncle and furuncle of buttock 12/04/2009   Elevated WBC count 01/09/2009   Hypoxemia 12/10/2008   Chronic obstructive asthma with status asthmaticus (HCC) 03/31/2006   Accumulation of fluid in tissues 03/31/2006   Esophagitis, reflux 03/31/2006   Decreased potassium in the blood 03/31/2006   Adiposity 03/31/2006   Primary localized osteoarthrosis, lower leg 03/31/2006   Apnea, sleep 03/31/2006    Past Surgical History:  Procedure Laterality Date   APPENDECTOMY  1974   CHOLECYSTECTOMY  1974   NASAL SINUS SURGERY  [phone]   TONSILLECTOMY AND ADENOIDECTOMY  1970   VASECTOMY  1979    Prior to Admission medications   Medication Sig Start Date End Date Taking? Authorizing Provider  albuterol (PROVENTIL HFA;VENTOLIN HFA) 108 (90 Base) MCG/ACT inhaler Inhale 2 puffs into the lungs 2 (two) times daily. 03/13/18   Chrismon, Jodell Cipro, PA  cefdinir (OMNICEF) 300 MG capsule Take 1 capsule (300 mg total) by mouth 2 (two) times daily. 07/20/18   Chrismon, Jodell Cipro, PA  digoxin (LANOXIN) 0.25 MG tablet Take 0.25 mg by mouth daily.    [provider]  diltiazem (CARDIZEM CD) 300 MG 24 hr capsule Take 300 mg by mouth daily.  08/24/17   [provider]  famotidine (PEPCID) 20 MG tablet Take 1 tablet (20 mg total) by mouth 2 (two) times daily. 03/20/18   Chrismon, Jodell Cipro, PA  furosemide (LASIX) 40 MG tablet TAKE  ONE TABLET TWICE DAILY AS NEEDED 01/04/17   Chrismon, Jodell Cipro, PA  ibuprofen (ADVIL,MOTRIN) 200 MG tablet Take 1 tablet by mouth as needed.    [provider]  ipratropium-albuterol (DUONEB) 0.5-2.5 (3) MG/3ML SOLN Take 3 mLs by nebulization every 4 (four) hours as needed.    [provider]  metoprolol tartrate (LOPRESSOR) 50 MG tablet Take by mouth. 03/20/18 03/20/19   [provider]  Mometasone Furoate [facility] HFA) 200 MCG/ACT AERO Inhale 2 puffs into the lungs 2 (two) times daily after a meal. 05/29/18   Chrismon, Jodell Cipro, PA  montelukast (SINGULAIR) 10 MG tablet TAKE ONE TABLET AT BEDTIME 04/07/18   Chrismon, Jodell Cipro, PA  morphine (ROXANOL) 20 MG/ML concentrated solution Take 20 mg by mouth every 4 (four) hours as needed for shortness of breath.     [provider]  MULTIPLE VITAMIN PO Take 1 tablet by mouth daily.    [provider]  Multiple Vitamins-Minerals (ICAPS AREDS 2) CAPS Take 2 each by mouth every 12 (twelve) hours.    [provider]  oseltamivir (TAMIFLU) 75 MG capsule Take 1 capsule (75 mg total) by mouth 2 (two) times daily. 03/13/18   Chrismon, Jodell Cipro, PA  potassium chloride SA (K-DUR,KLOR-CON) 20 MEQ tablet  10/07/17   [provider]  predniSONE (DELTASONE) 10 MG tablet Take 1 tablet (10 mg total) by mouth daily with breakfast. 05/29/18   Chrismon, Maurine Minister E, PA  sodium chloride 0.9 % nebulizer solution Take 3 mLs by nebulization as needed for wheezing. May be used alone or with Duoneb up to TID. 03/20/18   Chrismon, Jodell Cipro, PA    Allergies Promethazine; Aspirin; and Sulfa antibiotics  Family History  Problem Relation Age of Onset   Heart disease Mother    Congestive Heart Failure Father     Social History Social History   Tobacco Use   Smoking status: Former Smoker    Types: Cigarettes   Smokeless tobacco: Never Used   Tobacco comment: quit in 2003  Substance Use Topics   Alcohol use: No    Alcohol/week: 0.0 standard drinks   Drug use: No    Review of Systems  Constitutional: Negative for fever. ENT: Negative for sore throat. Cardiovascular: Negative for chest pain. Respiratory: + shortness of breath, cough Gastrointestinal: Negative for abdominal pain, vomiting or diarrhea.  ____________________________________________   PHYSICAL EXAM:  VITAL SIGNS: ED  Triage Vitals  Enc Vitals Group     BP 07/03/2018 1245 107/88     Pulse Rate 07/20/2018 1245 78     Resp 07/06/2018 1245 18     Temp 07/09/2018 1248 98.2 F (36.8 C)     Temp Source 07/08/2018 1248 Axillary     SpO2 07/06/2018 1240 93 %     Weight 07/26/2018 1248 264 lb 8.8 oz (120 kg)     Height 07/14/2018 1248 5\' 9"  (1.753 m)     Head Circumference --      Peak Flow --      Pain Score --      Pain Loc --      Pain Edu? --      Excl. in GC? --     Constitutional: Alert and oriented, severe respiratory distress.  HEENT:      Head: Normocephalic and atraumatic.         Eyes: Conjunctivae are normal. Sclera is non-icteric.       Mouth/Throat: Mucous membranes are moist.  Neck: Supple with no signs of meningismus. Cardiovascular: Regular rate and rhythm. No murmurs, gallops, or rubs. 2+ symmetrical distal pulses are present in all extremities. No JVD. Respiratory: Increased work of breathing, hypoxic on 10 L CPAP, severely diminished air movement bilaterally gastrointestinal: Soft, non tender, and non distended with positive bowel sounds. No rebound or guarding. Musculoskeletal: Nontender with normal range of motion in all extremities. No edema, cyanosis, or erythema of extremities. Neurologic: Normal speech and language. Face is symmetric. Moving all extremities. No gross focal neurologic deficits are appreciated. Skin: Skin is warm, dry and intact. No rash noted. Psychiatric: Mood and affect are normal. Speech and behavior are normal.  ____________________________________________   LABS (all labs ordered are listed, but only abnormal results are displayed)  Labs Reviewed  CBC WITH DIFFERENTIAL/PLATELET - Abnormal; Notable for the following components:      Result Value   WBC 15.3 (*)    RBC 2.66 (*)    Hemoglobin 7.9 (*)    HCT 26.3 (*)    Neutro Abs 14.4 (*)    Lymphs Abs 0.3 (*)    Abs Immature Granulocytes 0.10 (*)    All other components within normal limits  NOVEL  CORONAVIRUS, NAA (HOSPITAL ORDER, SEND-OUT TO REF LAB)  CULTURE, BLOOD (ROUTINE X 2)  CULTURE, BLOOD (ROUTINE X 2)  INFLUENZA PANEL BY PCR (TYPE A & B)  COMPREHENSIVE METABOLIC PANEL  TROPONIN I  LACTIC ACID, PLASMA  TYPE AND SCREEN   ____________________________________________  EKG  ED ECG REPORT I, Nita Sicklearolina Khaleelah Yowell, the attending physician, personally viewed and interpreted this ECG.  Normal sinus rhythm, rate of 83, nonspecific intraventricular conduction delay, normal QTC, right axis deviation, no ST elevations or depressions.  Unchanged from prior. ____________________________________________  RADIOLOGY  I have personally reviewed the images performed during this visit and I agree with the Radiologist's read.   Interpretation by Radiologist:  Dg Abdomen 1 View  Result Date: 2019/03/21 CLINICAL DATA:  OG tube placement. EXAM: ABDOMEN - 1 VIEW COMPARISON:  None. FINDINGS: OG tube appears to be adequately positioned within the stomach, although the tip is difficult to visualize at the lower portion of the exam. IMPRESSION: OG tube appears to be adequately positioned within the stomach. Electronically Signed   By: Bary RichardStan  Maynard M.D.   On: 02020/11/18 13:39   Dg Chest Portable 1 View  Result Date: 2019/03/21 CLINICAL DATA:  Status post intubation and NG tube placement. EXAM: PORTABLE CHEST 1 VIEW COMPARISON:  Single-view the chest 05/20/2016 and 05/17/2016. CT chest 05/18/2016. FINDINGS: Endotracheal tube is in place with the tip in good position 3.5 cm above the clavicular heads. The lungs are emphysematous. Bibasilar airspace disease appears slightly worse on the right. Heart size is normal. No pneumothorax or pleural effusion. No acute bony abnormality. IMPRESSION: ETT in good position. Bibasilar airspace disease most compatible with pneumonia is somewhat worse on the right. Emphysema. Electronically Signed   By: Drusilla Kannerhomas  Dalessio M.D.   On: 02020/11/18 13:39       ____________________________________________   PROCEDURES  Procedure(s) performed:yes Procedure Name: Intubation Date/Time: 2019/03/21 1:53 PM Performed by: Nita SickleVeronese, [place], MD Pre-anesthesia Checklist: Patient identified, Emergency Drugs available, Suction available and Patient being monitored Preoxygenation: Pre-oxygenation with 100% oxygen Induction Type: IV induction and Rapid sequence Laryngoscope Size: Glidescope Tube size: 7.5 mm Number of attempts: 1 Airway Equipment and Method: Video-laryngoscopy Placement Confirmation: ETT inserted through vocal cords under direct vision,  CO2 detector and Breath sounds checked- equal and bilateral Secured at: 23  cm Tube secured with: ETT holder Dental Injury: Teeth and Oropharynx as per pre-operative assessment       Critical Care performed: yes  CRITICAL CARE Performed by: Nita Sickle  ?  Total critical care time: 40 min  Critical care time was exclusive of separately billable procedures and treating other patients.  Critical care was necessary to treat or prevent imminent or life-threatening deterioration.  Critical care was time spent personally by me on the following activities: development of treatment plan with patient and/or surrogate as well as nursing, discussions with consultants, evaluation of patient's response to treatment, examination of patient, obtaining history from patient or surrogate, ordering and performing treatments and interventions, ordering and review of laboratory studies, ordering and review of radiographic studies, pulse oximetry and re-evaluation of patient's condition.  ____________________________________________   INITIAL IMPRESSION / ASSESSMENT AND PLAN / ED COURSE   68 y.o. male with a history of asthma, COPD on 2 L nasal cannula, diastolic CHF, hypertension, OSA on CPAP who presents for evaluation of shortness of breath and respiratory distress.  Patient arrives severe respiratory  distress, hypoxic on 10 L CPAP.  Due to concerns for COVID patient was intubated per procedure note above.  Chest x-ray concerning for bilateral pneumonia.  No recent hospitalization therefore patient was covered with Rocephin and azithromycin.  Flu swab was negative. COVID swab pending. Labs showing leukocytosis and new anemia with hgb 7.9. Patient not on blood thinners. Type and screen sent. Discussed with Dr. Allena Katz for ICU admission. Spoke with patient's son in the family room and updated him on patient's status. Recommended quarantine for household members and any family or friends who have been with patient over the last 2 to 3 days until results of viral swab is back.      As part of my medical decision making, I reviewed the following data within the electronic MEDICAL RECORD NUMBER Nursing notes reviewed and incorporated, Labs reviewed , EKG interpreted , Old EKG reviewed, Old chart reviewed, Radiograph reviewed , Discussed with admitting physician , Notes from prior ED visits and [place] Controlled Substance Database    Pertinent labs & imaging results that were available during my care of the patient were reviewed by me and considered in my medical decision making (see chart for details).    ____________________________________________   FINAL CLINICAL IMPRESSION(S) / ED DIAGNOSES  Final diagnoses:  Acute respiratory failure with hypoxia (HCC)  Community acquired pneumonia, unspecified laterality  Anemia, unspecified type      NEW MEDICATIONS STARTED DURING THIS VISIT:  ED Discharge Orders    None       Note:  This document was prepared using Dragon voice recognition software and may include unintentional dictation errors.    Don Perking, [place], MD 05-Aug-2018 [phone]

## 2018-07-23 NOTE — ED Notes (Signed)
Pt intubated by MD Don Perking after the administration of 30 mg of etomidate and 100mg  of rocc

## 2018-07-23 NOTE — H&P (Addendum)
[facility] -  at [facility]   PATIENT NAME: [name]    MR#:  [id number]  DATE OF BIRTH:  [birth year]  DATE OF ADMISSION:  07/21/2018  PRIMARY CARE PHYSICIAN: Chrismon, Jodell Ciproennis E, PA   REQUESTING/REFERRING PHYSICIAN: Nita SickleVeronese, [place], MD  CHIEF COMPLAINT:   Chief Complaint  Patient presents with  . Respiratory Distress    HISTORY OF PRESENT ILLNESS: [name]  is a 68 y.o. male with a known history of COPD and chronic respiratory failure who is on 2 L of oxygen at all times with history of chronic diastolic CHF, hypertension, obstructive sleep apnea on CPAP who presented to the emergency room for shortness of breath.  According to patient's family patient developed shortness of breath ongoing for the last 24 hours.  He is also having cough.  There is no evidence of fever.  Patient became hypoxic in the 7770s when the EMS arrived.  Patient required 10 L of oxygen on CPAP on route here.  Had to be intubated.  Chest x-ray shows bilateral infiltrates.  And is currently intubated he is being ruled out for covert 19.  He has no travel history or any recent visitors according to family from out of state.    PAST MEDICAL HISTORY:   Past Medical History:  Diagnosis Date  . Anxiety   . Arthritis    in back and knees  . Asthma   . CHF (congestive heart failure) (HCC)   . COPD (chronic obstructive pulmonary disease) (HCC)   . Dyspnea   . Hypertension   . Obesity     PAST SURGICAL HISTORY:  Past Surgical History:  Procedure Laterality Date  . APPENDECTOMY  1974  . CHOLECYSTECTOMY  1974  . NASAL SINUS SURGERY  [id number] [id number]  . TONSILLECTOMY AND ADENOIDECTOMY  1970  . VASECTOMY  1979    SOCIAL HISTORY:  Social History   Tobacco Use  . Smoking status: Former Smoker    Types: Cigarettes  . Smokeless tobacco: Never Used  . Tobacco comment: quit in 2003  Substance Use Topics  . Alcohol use: No    Alcohol/week: 0.0 standard drinks    FAMILY  HISTORY:  Family History  Problem Relation Age of Onset  . Heart disease Mother   . Congestive Heart Failure Father     DRUG ALLERGIES:  Allergies  Allergen Reactions  . Promethazine Other (See Comments)    Caused pt to become disoriented after receiving 6.25mg  IV. Caused pt to become disoriented after receiving 6.25mg  IV.  Marland Kitchen. Aspirin     Other reaction(s): Asthma flare up  . Sulfa Antibiotics     REVIEW OF SYSTEMS:   CONSTITUTIONAL: Unable to provide     MEDICATIONS AT HOME:  Prior to Admission medications   Medication Sig Start Date End Date Taking? Authorizing Provider  albuterol (PROVENTIL HFA;VENTOLIN HFA) 108 (90 Base) MCG/ACT inhaler Inhale 2 puffs into the lungs 2 (two) times daily. 03/13/18   Chrismon, Jodell Ciproennis E, PA  cefdinir (OMNICEF) 300 MG capsule Take 1 capsule (300 mg total) by mouth 2 (two) times daily. 07/20/18   Chrismon, Jodell Ciproennis E, PA  digoxin (LANOXIN) 0.25 MG tablet Take 0.25 mg by mouth daily.    [provider]  diltiazem (CARDIZEM CD) 300 MG 24 hr capsule Take 300 mg by mouth daily.  08/24/17   [provider]  famotidine (PEPCID) 20 MG tablet Take 1 tablet (20 mg total) by mouth 2 (two) times daily. 03/20/18   Chrismon, Maurine Ministerennis  E, PA  furosemide (LASIX) 40 MG tablet TAKE ONE TABLET TWICE DAILY AS NEEDED 01/04/17   Chrismon, Jodell Cipro, PA  ibuprofen (ADVIL,MOTRIN) 200 MG tablet Take 1 tablet by mouth as needed.    [provider]  ipratropium-albuterol (DUONEB) 0.5-2.5 (3) MG/3ML SOLN Take 3 mLs by nebulization every 4 (four) hours as needed.    [provider]  metoprolol tartrate (LOPRESSOR) 50 MG tablet Take by mouth. 03/20/18 03/20/19  [provider]  Mometasone Furoate [facility] HFA) 200 MCG/ACT AERO Inhale 2 puffs into the lungs 2 (two) times daily after a meal. 05/29/18   Chrismon, Jodell Cipro, PA  montelukast (SINGULAIR) 10 MG tablet TAKE ONE TABLET AT BEDTIME 04/07/18   Chrismon, Jodell Cipro, PA  morphine (ROXANOL) 20  MG/ML concentrated solution Take 20 mg by mouth every 4 (four) hours as needed for shortness of breath.     [provider]  MULTIPLE VITAMIN PO Take 1 tablet by mouth daily.    [provider]  Multiple Vitamins-Minerals (ICAPS AREDS 2) CAPS Take 2 each by mouth every 12 (twelve) hours.    [provider]  oseltamivir (TAMIFLU) 75 MG capsule Take 1 capsule (75 mg total) by mouth 2 (two) times daily. 03/13/18   Chrismon, Jodell Cipro, PA  potassium chloride SA (K-DUR,KLOR-CON) 20 MEQ tablet  10/07/17   [provider]  predniSONE (DELTASONE) 10 MG tablet Take 1 tablet (10 mg total) by mouth daily with breakfast. 05/29/18   Chrismon, Maurine Minister E, PA  sodium chloride 0.9 % nebulizer solution Take 3 mLs by nebulization as needed for wheezing. May be used alone or with Duoneb up to TID. 03/20/18   Chrismon, Jodell Cipro, PA      PHYSICAL EXAMINATION:   VITAL SIGNS: Blood pressure 98/62, pulse (!) 47, temperature 98.2 F (36.8 C), temperature source Axillary, resp. rate 20, height 5\' 9"  (1.753 m), weight 120 kg, SpO2 100 %.  GENERAL:  68 y.o.-year-old patient lying in the bed Giglio on the ventilator EYES: Pupils equal, round, reactive to light and accommodation. No scleral icterus. Extraocular muscles intact.  HEENT: Head atraumatic, normocephalic. Oropharynx and nasopharynx clear.  NECK:  Supple, no jugular venous distention. No thyroid enlargement, no tenderness.  LUNGS: Diminished breath sounds, seen on the ventilator CARDIOVASCULAR: S1, S2 normal. No murmurs, rubs, or gallops.  ABDOMEN: Soft, nontender, nondistended. Bowel sounds present. No organomegaly or mass.  EXTREMITIES: 1+ pedal edema, cyanosis, or clubbing.  NEUROLOGIC: Both follow commands due to being on the ventilator PSYCHIATRIC: The patient is alert and oriented x 3.  SKIN: No obvious rash, lesion, or ulcer.   LABORATORY PANEL:   CBC Recent Labs  Lab 2018/08/04 1253  WBC 15.3*  HGB 7.9*  HCT 26.3*   PLT 198  MCV 98.9  MCH 29.7  MCHC 30.0  RDW 14.4  LYMPHSABS 0.3*  MONOABS 0.5  EOSABS 0.0  BASOSABS 0.0   ------------------------------------------------------------------------------------------------------------------  Chemistries  No results for input(s): NA, K, CL, CO2, GLUCOSE, BUN, CREATININE, CALCIUM, MG, AST, ALT, ALKPHOS, BILITOT in the last 168 hours.  Invalid input(s): GFRCGP ------------------------------------------------------------------------------------------------------------------ CrCl cannot be calculated (Patient's most recent lab result is older than the maximum 21 days allowed.). ------------------------------------------------------------------------------------------------------------------ No results for input(s): TSH, T4TOTAL, T3FREE, THYROIDAB in the last 72 hours.  Invalid input(s): FREET3   Coagulation profile No results for input(s): INR, PROTIME in the last 168 hours. ------------------------------------------------------------------------------------------------------------------- No results for input(s): DDIMER in the last 72 hours. -------------------------------------------------------------------------------------------------------------------  Cardiac Enzymes No results for input(s):  CKMB, TROPONINI, MYOGLOBIN in the last 168 hours.  Invalid input(s): CK ------------------------------------------------------------------------------------------------------------------ Invalid input(s): POCBNP  ---------------------------------------------------------------------------------------------------------------  Urinalysis    Component Value Date/Time   COLORURINE YELLOW (A) 05/17/2016 1217   APPEARANCEUR CLEAR (A) 05/17/2016 1217   APPEARANCEUR Clear 05/04/2013 0411   LABSPEC 1.013 05/17/2016 1217   LABSPEC 1.014 05/04/2013 0411   PHURINE 5.0 05/17/2016 1217   GLUCOSEU NEGATIVE 05/17/2016 1217   GLUCOSEU Negative 05/04/2013 0411    HGBUR NEGATIVE 05/17/2016 1217   BILIRUBINUR NEGATIVE 05/17/2016 1217   BILIRUBINUR Negative 05/04/2013 0411   KETONESUR 5 (A) 05/17/2016 1217   PROTEINUR 30 (A) 05/17/2016 1217   NITRITE NEGATIVE 05/17/2016 1217   LEUKOCYTESUR NEGATIVE 05/17/2016 1217   LEUKOCYTESUR Negative 05/04/2013 0411     RADIOLOGY: Dg Abdomen 1 View  Result Date: Aug 09, 2018 CLINICAL DATA:  OG tube placement. EXAM: ABDOMEN - 1 VIEW COMPARISON:  None. FINDINGS: OG tube appears to be adequately positioned within the stomach, although the tip is difficult to visualize at the lower portion of the exam. IMPRESSION: OG tube appears to be adequately positioned within the stomach. Electronically Signed   By: Bary Richard M.D.   On: August 09, 2018 13:39   Dg Chest Portable 1 View  Result Date: 08-09-2018 CLINICAL DATA:  Status post intubation and NG tube placement. EXAM: PORTABLE CHEST 1 VIEW COMPARISON:  Single-view the chest 05/20/2016 and 05/17/2016. CT chest 05/18/2016. FINDINGS: Endotracheal tube is in place with the tip in good position 3.5 cm above the clavicular heads. The lungs are emphysematous. Bibasilar airspace disease appears slightly worse on the right. Heart size is normal. No pneumothorax or pleural effusion. No acute bony abnormality. IMPRESSION: ETT in good position. Bibasilar airspace disease most compatible with pneumonia is somewhat worse on the right. Emphysema. Electronically Signed   By: Drusilla Kanner M.D.   On: 09-Aug-2018 13:39    EKG: Orders placed or performed during the hospital encounter of 08/09/18  . ED EKG  . ED EKG  . EKG 12-Lead  . EKG 12-Lead    IMPRESSION AND PLAN: Patient 68 year old with COPD and chronic respiratory failure who is presenting with acute respiratory failure  1.  Acute respiratory failure suspect due to acute COPD exasperation as well as pneumonia Treat with IV antibiotics I have notified the intensivist of the admission Continue ventilator Code 19 is being ruled  out Respiratory panel has also been sent Procalcitonin levels are also ordered  2.  Chronic CHF will order a BNP level  3.  COPD with acute exacerbation we will treat with nebs and steroids  4.  Hypertension we will hold antihypertensive  5.  Anemia which appears to be new compared to 2 months prior we will guaiac his stool check anemia panel  6. Misc: lovenox for dvt proph    All the records are reviewed and case discussed with ED provider. Management plans discussed with the patient, family and they are in agreement.  CODE STATUS: Code Status History    Date Active Date Inactive Code Status Order ID Comments User Context   05/17/2016 1404 05/24/2016 1513 Full Code [id number]  Shane Crutch, MD ED       TOTAL TIME TAKING CARE OF THIS PATIENT 55 minutes critical care time spent  Auburn Bilberry M.D on 2018/08/09 at 3:05 PM  Between 7am to 6pm - Pager - [phone]  After 6pm go to [email or web address] - Social research officer, government  [employer] [facility] Office  [phone]  CC: Primary care physician; Chrismon, Jodell Cipro, PA

## 2018-07-23 NOTE — ED Notes (Signed)
ED TO INPATIENT HANDOFF REPORT  ED Nurse Name and Phone #: Clinton Sawyer [phone]  S Name/Age/Gender [name] 68 y.o. male Room/Bed: ED01A/ED01A  Code Status   Code Status: Prior  Home/SNF/Other Home Patient oriented to: self Is this baseline? Yes      Chief Complaint SOB  Triage Note PT arrives via ems from home in respiratory distress. PT arrives on cpap. O2 saturation on cpap 80%. PT wears 2L chronically per ems report. PT HTN for ems and given 1" NTG paste and 125mg  of solumedrol prior to arrival. NTG paste removed upon arrival.    Allergies Allergies  Allergen Reactions  . Promethazine Other (See Comments)    Caused pt to become disoriented after receiving 6.25mg  IV. Caused pt to become disoriented after receiving 6.25mg  IV.  Marland Kitchen Aspirin     Other reaction(s): Asthma flare up  . Sulfa Antibiotics     Level of Care/Admitting Diagnosis ED Disposition    ED Disposition Condition Comment   Admit  Hospital [facility]: [facility] [[id number]]  Level of Care: ICU [6]  Diagnosis: PNA (pneumonia) [735329]  Admitting Physician: Auburn Bilberry [[id number]]  Attending Physician: Auburn Bilberry [[id number]]  Estimated length of stay: past midnight tomorrow  Certification:: I certify this patient will need inpatient services for at least 2 midnights  PT Class (Do Not Modify): Inpatient [101]  PT Acc Code (Do Not Modify): Private [1]       B Medical/Surgery History Past Medical History:  Diagnosis Date  . Anxiety   . Arthritis    in back and knees  . Asthma   . CHF (congestive heart failure) (HCC)   . COPD (chronic obstructive pulmonary disease) (HCC)   . Dyspnea   . Hypertension   . Obesity    Past Surgical History:  Procedure Laterality Date  . APPENDECTOMY  1974  . CHOLECYSTECTOMY  1974  . NASAL SINUS SURGERY  [phone]  . TONSILLECTOMY AND ADENOIDECTOMY  1970  . VASECTOMY  1979     A IV Location/Drains/Wounds Patient  Lines/Drains/Airways Status   Active Line/Drains/Airways    Name:   Placement date:   Placement time:   Site:   Days:   Peripheral IV 07/04/2018 Right Hand   07/07/2018    1247    Hand   less than 1   Peripheral IV 07/07/2018 Left Antecubital   07/06/2018    1247    Antecubital   less than 1   Peripheral IV 07/10/2018 Left Hand   07/19/2018    1230    Hand   less than 1   NG/OG Tube Orogastric 16 Fr. Right mouth 60 cm   07/11/2018    1314    Right mouth   less than 1   Urethral Catheter Vernona Rieger, RN Non-latex 16 Fr.   07/16/2018    1326    Non-latex   less than 1          Intake/Output Last 24 hours No intake or output data in the 24 hours ending 07/25/2018 1425  Labs/Imaging Results for orders placed or performed during the hospital encounter of 07/25/2018 (from the past 48 hour(s))  CBC with Differential     Status: Abnormal   Collection Time: 07/04/2018 12:53 PM  Result Value Ref Range   WBC 15.3 (H) 4.0 - 10.5 K/uL   RBC 2.66 (L) 4.22 - 5.81 MIL/uL   Hemoglobin 7.9 (L) 13.0 - 17.0 g/dL   HCT 17.4 (L) 08.1 - 44.8 %  MCV 98.9 80.0 - 100.0 fL   MCH 29.7 26.0 - 34.0 pg   MCHC 30.0 30.0 - 36.0 g/dL   RDW 56.4 33.2 - 95.1 %   Platelets 198 150 - 400 K/uL   nRBC 0.0 0.0 - 0.2 %   Neutrophils Relative % 94 %   Neutro Abs 14.4 (H) 1.7 - 7.7 K/uL   Lymphocytes Relative 2 %   Lymphs Abs 0.3 (L) 0.7 - 4.0 K/uL   Monocytes Relative 3 %   Monocytes Absolute 0.5 0.1 - 1.0 K/uL   Eosinophils Relative 0 %   Eosinophils Absolute 0.0 0.0 - 0.5 K/uL   Basophils Relative 0 %   Basophils Absolute 0.0 0.0 - 0.1 K/uL   Immature Granulocytes 1 %   Abs Immature Granulocytes 0.10 (H) 0.00 - 0.07 K/uL    Comment: Performed at [facility], [address], [place], [place] [address]  Influenza panel by PCR (type A & B)     Status: None   Collection Time: 08/14/2018 12:59 PM  Result Value Ref Range   Influenza A By PCR NEGATIVE NEGATIVE   Influenza B By PCR NEGATIVE NEGATIVE    Comment: (NOTE) The Xpert  Xpress Flu assay is intended as an aid in the diagnosis of  influenza and should not be used as a sole basis for treatment.  This  assay is FDA approved for nasopharyngeal swab specimens only. Nasal  washings and aspirates are unacceptable for Xpert Xpress Flu testing. Performed at [facility], [address] [place], [place] [address]   Type and screen     Status: None (Preliminary result)   Collection Time: 08-14-2018  1:30 PM  Result Value Ref Range   ABO/RH(D) PENDING    Antibody Screen PENDING    Sample Expiration      07/26/2018 Performed at [facility], [address] [place], [place] [address]    Dg Abdomen 1 View  Result Date: 2018/08/14 CLINICAL DATA:  OG tube placement. EXAM: ABDOMEN - 1 VIEW COMPARISON:  None. FINDINGS: OG tube appears to be adequately positioned within the stomach, although the tip is difficult to visualize at the lower portion of the exam. IMPRESSION: OG tube appears to be adequately positioned within the stomach. Electronically Signed   By: Bary Richard M.D.   On: 08/14/2018 13:39   Dg Chest Portable 1 View  Result Date: 08-14-18 CLINICAL DATA:  Status post intubation and NG tube placement. EXAM: PORTABLE CHEST 1 VIEW COMPARISON:  Single-view the chest 05/20/2016 and 05/17/2016. CT chest 05/18/2016. FINDINGS: Endotracheal tube is in place with the tip in good position 3.5 cm above the clavicular heads. The lungs are emphysematous. Bibasilar airspace disease appears slightly worse on the right. Heart size is normal. No pneumothorax or pleural effusion. No acute bony abnormality. IMPRESSION: ETT in good position. Bibasilar airspace disease most compatible with pneumonia is somewhat worse on the right. Emphysema. Electronically Signed   By: Drusilla Kanner M.D.   On: 08/14/2018 13:39    Pending Labs Unresulted Labs (From admission, onward)    Start     Ordered   2018/08/14 1425  Vitamin B12  (Anemia Panel (PNL))  Once,   STAT      14-Aug-2018 1424   08-14-18 1425  Folate  (Anemia Panel (PNL))  Once,   STAT     08/14/2018 1424   Aug 14, 2018 1425  Iron and TIBC  (Anemia Panel (PNL))  Once,   STAT  08-11-2018 1424   August 11, 2018 1425  Ferritin  (Anemia Panel (PNL))  Once,   STAT     August 11, 2018 1424   August 11, 2018 1425  Reticulocytes  (Anemia Panel (PNL))  Once,   STAT     2018-08-11 1424   08/11/2018 1359  Lactic acid, plasma  ONCE - STAT,   STAT     08/11/2018 1358   11-Aug-2018 1359  Blood culture (routine x 2)  BLOOD CULTURE X 2,   STAT     08/11/18 1358   August 11, 2018 1302  Novel Coronavirus, NAA (hospital order; send-out to ref lab)  (Novel Coronavirus, NAA [facility] Order; send-out to ref lab) with precautions panel)  Once,   STAT    Comments:  Respiratory failure   Question:  Current symptoms  Answer:  Other (testing not indicated)   August 11, 2018 1301   2018/08/11 1253  Comprehensive metabolic panel  ONCE - STAT,   STAT     08-11-18 1252   08/11/18 1253  Troponin I - ONCE - STAT  ONCE - STAT,   STAT     08/11/2018 1252   Unscheduled  Occult blood card to lab, stool  As needed,   STAT     Aug 11, 2018 1424   Signed and Held  Procalcitonin - Baseline  ONCE - STAT,   STAT     Signed and Held   Signed and Held  HIV antibody (Routine Testing)  Once,   R     Signed and Held   Signed and Held  CBC  (enoxaparin (LOVENOX)    CrCl >/= 30 ml/min)  Once,   R    Comments:  Baseline for enoxaparin therapy IF NOT ALREADY DRAWN.  Notify MD if PLT < 100 K.    Signed and Held   Signed and Held  Creatinine, serum  (enoxaparin (LOVENOX)    CrCl >/= 30 ml/min)  Once,   R    Comments:  Baseline for enoxaparin therapy IF NOT ALREADY DRAWN.    Signed and Held   Signed and Held  Creatinine, serum  (enoxaparin (LOVENOX)    CrCl >/= 30 ml/min)  Weekly,   R    Comments:  while on enoxaparin therapy    Signed and Held   Signed and Held  Culture, sputum-assessment  Once,   R     Signed and Held   Signed and Held  CBC  Tomorrow morning,   R     Signed and Held    Signed and Held  Basic metabolic panel  Tomorrow morning,   R     Signed and Held          Vitals/Pain Today's Vitals   August 11, 2018 1339 August 11, 2018 1345 08/11/2018 1406 August 11, 2018 1414  BP: 103/71 104/71 119/68 108/66  Pulse: (!) 57 61 (!) 46 (!) 47  Resp: Temp:      TempSrc:      SpO2: 100% 100% 100% 100%  Weight:      Height:        Isolation Precautions Airborne and Contact precautions  Medications Medications  dexmedetomidine (PRECEDEX) 400 MCG/100ML (4 mcg/mL) infusion (0.4 mcg/kg/hr  120 kg Intravenous New Bag/Given 2018/08/11 1243)  cefTRIAXone (ROCEPHIN) 1 g in sodium chloride 0.9 % 100 mL IVPB (1 g Intravenous New Bag/Given 08/11/18 1419)  azithromycin (ZITHROMAX) 500 mg in sodium chloride 0.9 % 250 mL IVPB (500 mg Intravenous New Bag/Given 2018-08-11 1423)  cefTRIAXone (ROCEPHIN) 1 g in sodium chloride  0.9 % 100 mL IVPB (has no administration in time range)  azithromycin (ZITHROMAX) 500 mg in sodium chloride 0.9 % 250 mL IVPB (has no administration in time range)  sodium chloride 0.9 % bolus 1,000 mL (0 mLs Intravenous Stopped Jul 26, 2018 1321)  sodium chloride 0.9 % bolus 1,000 mL (1,000 mLs Intravenous New Bag/Given July 26, 2018 1354)    Mobility walks     Focused Assessments Pulmonary Assessment Handoff:  Lung sounds:   O2 Device: Ventilator        R Recommendations: See Admitting Provider Note  Report given to:   Additional Notes: covid-19 test pending. Travel unknown

## 2018-07-23 NOTE — Consult Note (Signed)
CRITICAL CARE NOTE      CHIEF COMPLAINT:   Acute hypoxemic respiratory failure   SUBJECTIVE FINDINGS & SIGNIFICANT EVENTS   This is a 68 year old male with a history of COPD Gold stage 4D with FEV1 of 26% 0.9 L patient 3 L/min nasal cannula at home, he is being followed by Dr. Chancy Milroy pulmonary.  He also has a history of CHF however I was unable to find a recent transthoracic echo, results for TTE from 2017 on care everywhere unavailable.  She has a background history of obesity, obstructive sleep apnea as well as osteoarthritis chronic vitamin D deficiency.  Details of his current admission were obtained from his wife Christopher Glasscock.  Wife states she had been sick since March 16th, had abx from primary care.  He was sick from 3/18 and on 3/20 started to have respiratory distress.  He is on 3L/min but tried going up to max of 5L/min on O2 concentrator without improvement.  He has been on prednisone taper since 3/20.  Wife states he was hot but checked Tmax was <99.  He has chronic cough with phlegm which was increased in volume.  Wife states he was unable to lay supine due to orthopnea.  She denies NVD or hematochezia, she relates he was unable to make urine despite taking lasix . Patient complained of weakness/fatigue but no muscle aches.  Grandchildren were also sick with flu like illness "were all coughing".  No one in household travelled in past 1 year.  She denies patient has history of acute blood loss.  On admission patient is intubated on Precedex with laboratory findings notable for leukocytosis with lymphopenia, and a acute hemoglobin drop from baseline of 12.5-7.9, his BNP and lactate are within reference range.  Review of chest imaging is as below in imaging section.    PAST MEDICAL HISTORY   Past Medical History:   Diagnosis Date  . Anxiety   . Arthritis    in back and knees  . Asthma   . CHF (congestive heart failure) ([facility])   . COPD (chronic obstructive pulmonary disease) ([facility])   . Dyspnea   . Hypertension   . Obesity      SURGICAL HISTORY   Past Surgical History:  Procedure Laterality Date  . APPENDECTOMY  1974  . CHOLECYSTECTOMY  1974  . NASAL SINUS SURGERY  [phone]  . TONSILLECTOMY AND ADENOIDECTOMY  1970  . VASECTOMY  1979     FAMILY HISTORY   Family History  Problem Relation Age of Onset  . Heart disease Mother   . Congestive Heart Failure Father      SOCIAL HISTORY   Social History   Tobacco Use  . Smoking status: Former Smoker    Types: Cigarettes  . Smokeless tobacco: Never Used  . Tobacco comment: quit in 2003  Substance Use Topics  . Alcohol use: No    Alcohol/week: 0.0 standard drinks  . Drug use: No     MEDICATIONS   Current Medication:  Current Facility-Administered Medications:  .  0.9 %  sodium chloride infusion, , Intravenous, Continuous, Dustin Flock, MD, Stopped at 07/11/2018 1536 .  [START ON 07/24/2018] azithromycin (ZITHROMAX) 500 mg in sodium chloride 0.9 % 250 mL IVPB, 500 mg, Intravenous, Q24H, Dustin Flock, MD .  Derrill Memo ON 07/24/2018] cefTRIAXone (ROCEPHIN) 1 g in sodium chloride 0.9 % 100 mL IVPB, 1 g, Intravenous, Q24H, Patel, Shreyang, MD .  chlorhexidine gluconate (MEDLINE KIT) (PERIDEX) 0.12 % solution 15 mL, 15 mL,  Mouth Rinse, BID, Ottie Glazier, MD .  Derrill Memo ON 07/24/2018] Chlorhexidine Gluconate Cloth 2 % PADS 6 each, 6 each, Topical, Q0600, Ivet Guerrieri, MD .  dexmedetomidine (PRECEDEX) 400 MCG/100ML (4 mcg/mL) infusion, 0.4-1.2 mcg/kg/hr, Intravenous, Titrated, Veronese, [place], MD, Last Rate: 9 mL/hr at 08/01/2018 1601, 0.3 mcg/kg/hr at 07/21/2018 1601 .  enoxaparin (LOVENOX) injection 40 mg, 40 mg, Subcutaneous, Q24H, Dustin Flock, MD .  fentaNYL 2549mg in NS 2540m(10102mml) infusion-PREMIX, 0-400 mcg/hr,  Intravenous, Continuous, Mileah Hemmer, MD, Last Rate: 25 mL/hr at 07/22/2018 1600, 250 mcg/hr at 07/18/2018 1600 .  ipratropium-albuterol (DUONEB) 0.5-2.5 (3) MG/3ML nebulizer solution 3 mL, 3 mL, Nebulization, Q4H, PatDustin FlockD, 3 mL at 08/01/2018 1546 .  MEDLINE mouth rinse, 15 mL, Mouth Rinse, 10 times per day, AleOttie GlazierD    ALLERGIES   Promethazine; Aspirin; and Sulfa antibiotics    REVIEW OF SYSTEMS   Unable to obtain due to acute illness and sedated state on mechanical ventilation.  PHYSICAL EXAMINATION   Vitals:   07/09/2018 1445 07/27/2018 1518  BP: 98/62 132/69  Pulse: (!) 47 (!) 57  Resp: 20 17  Temp:  (!) 97.1 F (36.2 C)  SpO2: 100% 95%    GENERAL: No apparent distress HEAD: Normocephalic, atraumatic.  EYES: Pupils equal, round, reactive to light.  No scleral icterus.  MOUTH: Moist mucosal membrane. NECK: Supple. No thyromegaly. No nodules. No JVD.  PULMONARY: Mild bibasilar crackles CARDIOVASCULAR: S1 and S2. Regular rate and rhythm. No murmurs, rubs, or gallops.  GASTROINTESTINAL: Soft, nontender, non-distended. No masses. Positive bowel sounds. No hepatosplenomegaly.  MUSCULOSKELETAL: No swelling, clubbing, or edema.  NEUROLOGIC: Mild distress due to acute illness SKIN:intact,warm,dry   LABS AND IMAGING     -I personally reviewed most recent blood work, imaging and microbiology - significant findings today are hyperkalemia, hyperchloremia and mild hyperglycemia, anemia, leukocytosis  LAB RESULTS: Recent Labs  Lab 07/06/2018 1403  NA 135  K 5.3*  CL 95*  CO2 31  BUN 23  CREATININE 0.82  GLUCOSE 161*   Recent Labs  Lab 07/25/2018 1253  HGB 7.9*  HCT 26.3*  WBC 15.3*  PLT 198     IMAGING RESULTS: Dg Abdomen 1 View  Result Date: 07/04/2018 CLINICAL DATA:  OG tube placement. EXAM: ABDOMEN - 1 VIEW COMPARISON:  None. FINDINGS: OG tube appears to be adequately positioned within the stomach, although the tip is difficult to visualize  at the lower portion of the exam. IMPRESSION: OG tube appears to be adequately positioned within the stomach. Electronically Signed   By: StaFranki CabotD.   On: 07/25/2018 13:39   Dg Chest Portable 1 View  Result Date: 07/27/2018 CLINICAL DATA:  Status post intubation and NG tube placement. EXAM: PORTABLE CHEST 1 VIEW COMPARISON:  Single-view the chest 05/20/2016 and 05/17/2016. CT chest 05/18/2016. FINDINGS: Endotracheal tube is in place with the tip in good position 3.5 cm above the clavicular heads. The lungs are emphysematous. Bibasilar airspace disease appears slightly worse on the right. Heart size is normal. No pneumothorax or pleural effusion. No acute bony abnormality. IMPRESSION: ETT in good position. Bibasilar airspace disease most compatible with pneumonia is somewhat worse on the right. Emphysema. Electronically Signed   By: ThoInge RiseD.   On: 07/09/2018 13:39      CT chest  05/07/2016 -per my read- centrilobular emphysema, PA:YI:RSWNIdex>1 suggestive of pulm HTN    CXR 07/10/2018- Per my read -engorgement vascular pedicle with curly B-lines, cephalization, without blunting of the  costophrenic or cardiophrenic angles consistent with mild-mod pulmonary edema        ASSESSMENT AND PLAN    -Multidisciplinary rounds held today  Acute Hypoxic Respiratory Failure     - bilateral atypical/viral/community aquired pneumonia versus severe COPD exacerbation possibly secondary to viral URI as multiple sick contacts exposure at home with lower respiratory tract infection symptoms     -Less likely CHF due to normal BNP, absence of overt pulmonary edema, absence of peripheral pitting edema, will obtain transthoracic echo to evaluate cardiac status -Blood in respiratory cultures pending -Legionella and strep pneumoniae urine antigen pending -Respiratory viral panel pending -Influenza negative -Novel coronavirus -patient under investigation -Empiric coverage with Rocephin and  Zithromax for community-acquired pneumonia -Solu-Medrol 80 twice daily as well as duo nebs 4 severe COPD exacerbation   Acute blood loss anemia    -Repeating blood work to confirm    -KUB for signs of perforated viscus due to mild abdominal distention mildly dilated loops of bowel on previous x-ray    -If abnormal will consider CT abdomen    -Currently occult blood loss without hematochezia, will perform ultrasound assessment of splenorenal process and Morison's pouch for free fluid indicative of retroperitoneal bleed   -Empirically preparing  PRBC crossmatching for possible transfusion   NEUROLOGY - intubated and sedated - minimal sedation to achieve a RASS goal: -1 Wake up assessment pending    ID -continue IV abx as prescibed -follow up cultures  GI/Nutrition GI PROPHYLAXIS as indicated DIET-->TF's as tolerated Constipation protocol as indicated  ENDO - ICU hypoglycemic\Hyperglycemia protocol -check FSBS per protocol   ELECTROLYTES -follow labs as needed -replace as needed -pharmacy consultation   DVT/GI PRX ordered -SCDs  TRANSFUSIONS AS NEEDED MONITOR FSBS ASSESS the need for LABS as needed   Critical care provider statement:    Critical care time (minutes):  95   Critical care time was exclusive of:  Separately billable procedures and treating other patients   Critical care was necessary to treat or prevent imminent or life-threatening deterioration of the following conditions:   Acute hypoxemic respiratory failure, acute COPD exacerbation, bilateral pneumonia, acute blood loss, multiple comorbid conditions   Critical care was time spent personally by me on the following activities:  Development of treatment plan with patient or surrogate, discussions with consultants, evaluation of patient's response to treatment, examination of patient, obtaining history from patient or surrogate, ordering and performing treatments and interventions, ordering and review of  laboratory studies and re-evaluation of patient's condition.  I assumed direction of critical care for this patient from another provider in my specialty: no    This document was prepared using Dragon voice recognition software and may include unintentional dictation errors.    Ottie Glazier, M.D.  Div[facility]

## 2018-07-23 NOTE — ED Triage Notes (Addendum)
PT arrives via ems from home in respiratory distress. PT arrives on cpap. O2 saturation on cpap 80%. PT wears 2L chronically per ems report. PT HTN for ems and given 1" NTG paste and 125mg  of solumedrol prior to arrival. NTG paste removed upon arrival.

## 2018-07-24 ENCOUNTER — Inpatient Hospital Stay: Payer: Medicare Other

## 2018-07-24 DIAGNOSIS — J9601 Acute respiratory failure with hypoxia: Secondary | ICD-10-CM

## 2018-07-24 LAB — CBC
HCT: 38 % — ABNORMAL LOW (ref 39.0–52.0)
Hemoglobin: 11.4 g/dL — ABNORMAL LOW (ref 13.0–17.0)
MCH: 29.3 pg (ref 26.0–34.0)
MCHC: 30 g/dL (ref 30.0–36.0)
MCV: 97.7 fL (ref 80.0–100.0)
PLATELETS: 273 10*3/uL (ref 150–400)
RBC: 3.89 MIL/uL — ABNORMAL LOW (ref 4.22–5.81)
RDW: 14.5 % (ref 11.5–15.5)
WBC: 19.5 10*3/uL — ABNORMAL HIGH (ref 4.0–10.5)
nRBC: 0 % (ref 0.0–0.2)

## 2018-07-24 LAB — MAGNESIUM: Magnesium: 2.5 mg/dL — ABNORMAL HIGH (ref 1.7–2.4)

## 2018-07-24 LAB — BASIC METABOLIC PANEL
Anion gap: 7 (ref 5–15)
BUN: 38 mg/dL — ABNORMAL HIGH (ref 8–23)
CHLORIDE: 98 mmol/L (ref 98–111)
CO2: 32 mmol/L (ref 22–32)
Calcium: 8.5 mg/dL — ABNORMAL LOW (ref 8.9–10.3)
Creatinine, Ser: 0.9 mg/dL (ref 0.61–1.24)
GFR calc Af Amer: >60 mL/min (ref >60)
GFR calc non Af Amer: >60 mL/min (ref >60)
Glucose, Bld: 146 mg/dL — ABNORMAL HIGH (ref 70–99)
POTASSIUM: 5.7 mmol/L — AB (ref 3.5–5.1)
Sodium: 137 mmol/L (ref 135–145)

## 2018-07-24 LAB — BLOOD GAS, ARTERIAL
Acid-Base Excess: 5.3 mmol/L — ABNORMAL HIGH (ref 0.0–2.0)
BICARBONATE: 34 mmol/L — AB (ref 20.0–28.0)
FIO2: 0.5
LHR: 16 {breaths}/min
MECHVT: 500 mL
O2 Saturation: 93.3 %
PEEP/CPAP: 5 cmH2O
Patient temperature: 37
pCO2 arterial: 69 mmHg (ref 32.0–48.0)
pH, Arterial: 7.3 — ABNORMAL LOW (ref 7.350–7.450)
pO2, Arterial: 75 mmHg — ABNORMAL LOW (ref 83.0–108.0)

## 2018-07-24 LAB — PHOSPHORUS: Phosphorus: 3.9 mg/dL (ref 2.5–4.6)

## 2018-07-24 LAB — VITAMIN B12: Vitamin B-12: 496 pg/mL (ref 180–914)

## 2018-07-24 LAB — STREP PNEUMONIAE URINARY ANTIGEN: Strep Pneumo Urinary Antigen: NEGATIVE

## 2018-07-24 LAB — POTASSIUM
POTASSIUM: 5.4 mmol/L — AB (ref 3.5–5.1)
Potassium: 5.1 mmol/L (ref 3.5–5.1)

## 2018-07-24 LAB — MRSA PCR SCREENING: MRSA by PCR: NEGATIVE

## 2018-07-24 LAB — GLUCOSE, CAPILLARY: Glucose-Capillary: 184 mg/dL — ABNORMAL HIGH (ref 70–99)

## 2018-07-24 MED ORDER — AZITHROMYCIN 500 MG PO TABS
500.0000 mg | ORAL_TABLET | ORAL | Status: DC
  Administered 2018-07-25 – 2018-07-27 (×3): 500 mg
  Filled 2018-07-24 (×4): qty 1

## 2018-07-24 MED ORDER — LORAZEPAM 2 MG/ML IJ SOLN
1.0000 mg | INTRAMUSCULAR | Status: DC | PRN
  Administered 2018-07-25 – 2018-07-28 (×4): 2 mg via INTRAVENOUS
  Filled 2018-07-24 (×4): qty 1

## 2018-07-24 MED ORDER — POLYETHYLENE GLYCOL 3350 17 G PO PACK
17.0000 g | PACK | ORAL | Status: DC
  Administered 2018-07-24 – 2018-07-27 (×4): 17 g
  Filled 2018-07-24 (×4): qty 1

## 2018-07-24 MED ORDER — PREDNISONE 5 MG/5ML PO SOLN
10.0000 mg | Freq: Every day | ORAL | Status: DC
  Administered 2018-07-24: 10 mg via ORAL
  Filled 2018-07-24: qty 10

## 2018-07-24 MED ORDER — IPRATROPIUM-ALBUTEROL 0.5-2.5 (3) MG/3ML IN SOLN: 3.0000 mL | Freq: Four times a day (QID) | RESPIRATORY_TRACT | Status: DC | PRN

## 2018-07-24 MED ORDER — IPRATROPIUM-ALBUTEROL 0.5-2.5 (3) MG/3ML IN SOLN
3.0000 mL | Freq: Two times a day (BID) | RESPIRATORY_TRACT | Status: DC
  Administered 2018-07-24: 3 mL via RESPIRATORY_TRACT
  Filled 2018-07-24: qty 3

## 2018-07-24 MED ORDER — PANTOPRAZOLE SODIUM 40 MG PO PACK: 40.0000 mg | PACK | Freq: Every day | ORAL | Status: DC

## 2018-07-24 MED ORDER — FUROSEMIDE 10 MG/ML IJ SOLN
40.0000 mg | Freq: Once | INTRAMUSCULAR | Status: AC
  Administered 2018-07-24: 40 mg via INTRAVENOUS
  Filled 2018-07-24: qty 4

## 2018-07-24 MED ORDER — PRO-STAT SUGAR FREE PO LIQD
30.0000 mL | Freq: Four times a day (QID) | ORAL | Status: DC
  Administered 2018-07-24 – 2018-07-27 (×14): 30 mL

## 2018-07-24 MED ORDER — ENOXAPARIN SODIUM 40 MG/0.4ML ~~LOC~~ SOLN
40.0000 mg | SUBCUTANEOUS | Status: DC
  Administered 2018-07-24 – 2018-07-27 (×4): 40 mg via SUBCUTANEOUS
  Filled 2018-07-24 (×4): qty 0.4

## 2018-07-24 MED ORDER — PREDNISONE 5 MG/5ML PO SOLN
10.0000 mg | ORAL | Status: DC
  Administered 2018-07-25 – 2018-07-27 (×3): 10 mg
  Filled 2018-07-24 (×4): qty 10

## 2018-07-24 MED ORDER — SENNOSIDES-DOCUSATE SODIUM 8.6-50 MG PO TABS
2.0000 | ORAL_TABLET | Freq: Two times a day (BID) | ORAL | Status: DC
  Administered 2018-07-24 – 2018-07-27 (×7): 2
  Filled 2018-07-24 (×7): qty 2

## 2018-07-24 MED ORDER — FREE WATER
30.0000 mL | Status: DC
  Administered 2018-07-24 – 2018-07-28 (×23): 30 mL

## 2018-07-24 MED ORDER — SODIUM ZIRCONIUM CYCLOSILICATE 5 G PO PACK
5.0000 g | PACK | Freq: Once | ORAL | Status: AC
  Administered 2018-07-24: 5 g via ORAL
  Filled 2018-07-24: qty 1

## 2018-07-24 MED ORDER — VITAL AF 1.2 CAL PO LIQD
1000.0000 mL | ORAL | Status: DC
  Administered 2018-07-24 – 2018-07-28 (×5): 1000 mL

## 2018-07-24 MED ORDER — PANTOPRAZOLE SODIUM 40 MG PO PACK
40.0000 mg | PACK | ORAL | Status: DC
  Administered 2018-07-24 – 2018-07-27 (×4): 40 mg
  Filled 2018-07-24 (×4): qty 20

## 2018-07-24 MED ORDER — PANTOPRAZOLE SODIUM 40 MG IV SOLR
40.0000 mg | INTRAVENOUS | Status: DC
  Administered 2018-07-24: 40 mg via INTRAVENOUS
  Filled 2018-07-24: qty 40

## 2018-07-24 MED ORDER — SODIUM CHLORIDE 0.9 % IV BOLUS: 500.0000 mL | Freq: Once | INTRAVENOUS | Status: DC

## 2018-07-24 NOTE — Progress Notes (Signed)
Initial Nutrition Assessment  DOCUMENTATION CODES:   Morbid obesity  INTERVENTION:   Vital 1.2 @ goal rate of 69ml/hr- Initiate at 21ml/hr and increase by 49ml/hr q 8 hours until goal rate is reached.   Prostat liquid protein PO 30 ml QID via tube, each supplement provides 100 kcal, 15 grams protein.  Free water flushes 7ml q4 hours to maintain tube patency   Regimen provides 1984kcal/day, 159g/day protein, 12101ml/day free water   NUTRITION DIAGNOSIS:   Inadequate oral intake related to inability to eat(pt sedated and ventilated ) as evidenced by NPO status.  GOAL:   Provide needs based on ASPEN/SCCM guidelines  MONITOR:   Vent status, Labs, Weight trends, Skin, I & O's  REASON FOR ASSESSMENT:   Ventilator    ASSESSMENT:   68 y.o. male with a known history of COPD and chronic respiratory failure who is on 2 L of oxygen at all times with history of chronic diastolic CHF, hypertension, obstructive sleep apnea on CPAP who presented to the emergency room for shortness of breath.   Pt sedated and ventilated. Unable to perform NFPE as pt is COVID 19 rule out. Pt does not appear malnourished from distant visualization. Per chart, pt appears weight stable pta. OGT in place with side port at GE junction; plan is to advance tube and initiate tube feeds today.   Medications reviewed and include: lovenox, protonix, prednisone, azithromycin, fentanyl, lokelma   Labs reviewed: K 5.7(H), P 3.9 wnl, Mg 2.5(H) BNP- 66- 3/22 WBC- 19.5(H) Iron 70, TIBC 372, ferritin 62, folate 29, B12 496- 3/22  Patient is currently intubated on ventilator support MV: 11 L/min Temp (24hrs), Avg:97.9 F (36.6 C), Min:97.1 F (36.2 C), Max:98.4 F (36.9 C)  Propofol: None   MAP- >35mmHg  UOP-   Unable to complete Nutrition-Focused physical exam at this time.   Diet Order:   Diet Order            Diet NPO time specified  Diet effective now             EDUCATION NEEDS:   No  education needs have been identified at this time  Skin:  Skin Assessment: Reviewed RN Assessment(ecchymosis)  Last BM:  pta  Height:   Ht Readings from Last 1 Encounters:  August 19, 2018 5\' 6"  (1.676 m)    Weight:   Wt Readings from Last 1 Encounters:  August 19, 2018 (!) 142.1 kg    Ideal Body Weight:  64.5 kg  BMI:  Body mass index is 50.56 kg/m.  Estimated Nutritional Needs:   Kcal:  1562-1989kcal/day   Protein:  >161g/day   Fluid:  1.9L/day   Betsey Holiday MS, RD, LDN Pager #- [phone] Office#- [phone] After Hours Pager: [phone]

## 2018-07-24 NOTE — Progress Notes (Signed)
CRITICAL CARE NOTE  CC  follow up respiratory failure  SUBJECTIVE Patient remains critically ill Prognosis is guarded On full vent support     SIGNIFICANT EVENTS 3.22 intubated   REVIEW OF SYSTEMS  PATIENT IS UNABLE TO PROVIDE COMPLETE REVIEW OF SYSTEMS DUE TO SEVERE CRITICAL ILLNESS   PHYSICAL EXAMINATION:  GENERAL:critically ill appearing, +resp distress HEAD: Normocephalic, atraumatic.  EYES: Pupils equal, round, reactive to light.  No scleral icterus.  MOUTH: Moist mucosal membrane. NECK: Supple. No thyromegaly. No nodules. No JVD.  PULMONARY: +rhonchi, +wheezing CARDIOVASCULAR: S1 and S2. Regular rate and rhythm. No murmurs, rubs, or gallops.  GASTROINTESTINAL: Soft, nontender, -distended. No masses. Positive bowel sounds. No hepatosplenomegaly.  MUSCULOSKELETAL: No swelling, clubbing, or edema.  NEUROLOGIC: obtunded, GCS<8 SKIN:intact,warm,dry    CULTURE RESULTS   Recent Results (from the past 240 hour(s))  Respiratory Panel by PCR     Status: Abnormal   Collection Time: 07/20/2018 12:59 PM  Result Value Ref Range Status   Adenovirus NOT DETECTED NOT DETECTED Final   Coronavirus 229E NOT DETECTED NOT DETECTED Final    Comment: (NOTE) The Coronavirus on the Respiratory Panel, DOES NOT test for the novel  Coronavirus (2019 nCoV)    Coronavirus HKU1 NOT DETECTED NOT DETECTED Final   Coronavirus NL63 NOT DETECTED NOT DETECTED Final   Coronavirus OC43 NOT DETECTED NOT DETECTED Final   Metapneumovirus NOT DETECTED NOT DETECTED Final   Rhinovirus / Enterovirus NOT DETECTED NOT DETECTED Final   Influenza A NOT DETECTED NOT DETECTED Final   Influenza B NOT DETECTED NOT DETECTED Final   Parainfluenza Virus 1 NOT DETECTED NOT DETECTED Final   Parainfluenza Virus 2 NOT DETECTED NOT DETECTED Final   Parainfluenza Virus 3 NOT DETECTED NOT DETECTED Final   Parainfluenza Virus 4 NOT DETECTED NOT DETECTED Final   Respiratory Syncytial Virus DETECTED (A) NOT DETECTED  Final    Comment: CRITICAL RESULT CALLED TO, READ BACK BY AND VERIFIED WITH: RN FELICIA P [id number] FCP    Bordetella pertussis NOT DETECTED NOT DETECTED Final   Chlamydophila pneumoniae NOT DETECTED NOT DETECTED Final   Mycoplasma pneumoniae NOT DETECTED NOT DETECTED Final    Comment: Performed at [facility], [place] [address] [place], [place] [address]  Blood culture (routine x 2)     Status: None (Preliminary result)   Collection Time: 07/14/2018  2:02 PM  Result Value Ref Range Status   Specimen Description BLOOD RIGHT ANTECUBITAL  Final   Special Requests   Final    BOTTLES DRAWN AEROBIC AND ANAEROBIC Blood Culture results may not be optimal due to an excessive volume of blood received in culture bottles   Culture   Final    NO GROWTH < 24 HOURS Performed at [facility], [address] [place], [place] [address]    Report Status PENDING  Incomplete  Blood culture (routine x 2)     Status: None (Preliminary result)   Collection Time: 07/30/2018  2:03 PM  Result Value Ref Range Status   Specimen Description BLOOD LEFT ANTECUBITAL  Final   Special Requests   Final    BOTTLES DRAWN AEROBIC AND ANAEROBIC Blood Culture adequate volume   Culture   Final    NO GROWTH < 24 HOURS Performed at [facility], [address], [place], [place] [address]    Report Status PENDING  Incomplete  Culture, respiratory     Status: None (Preliminary result)   Collection Time: 07/09/2018  3:39 PM  Result Value Ref Range Status   Specimen Description   Final    INDUCED SPUTUM Performed at [facility], [address] [place], [place] [address]    Special Requests   Final    NONE Performed at [facility], [address] [place], [place] [address]    Gram Stain   Final    FEW WBC PRESENT, PREDOMINANTLY PMN ABUNDANT GRAM POSITIVE COCCI IN PAIRS IN CLUSTERS FEW GRAM NEGATIVE RODS Performed at [facility], [place] [address], [place], [place]  [address]    Culture PENDING  Incomplete   Report Status PENDING  Incomplete  MRSA PCR Screening     Status: None   Collection Time: 07/07/2018 10:59 PM  Result Value Ref Range Status   MRSA by PCR NEGATIVE NEGATIVE Final    Comment:        The GeneXpert MRSA Assay (FDA approved for NASAL specimens only), is one component of a comprehensive MRSA colonization surveillance program. It is not intended to diagnose MRSA infection nor to guide or monitor treatment for MRSA infections. Performed at [facility], [address] [place], [place] [address]           IMAGING    Dg Abd 1 View  Result Date: 07/29/2018 CLINICAL DATA:  GI bleed. EXAM: ABDOMEN - 1 VIEW COMPARISON:  None. FINDINGS: Enteric tube is present in the stomach. Proximal side holes are likely at or near the level of the gastroesophageal junction. Visualized bowel gas pattern is nonobstructive. No evidence of free intraperitoneal air appreciated. No evidence of abnormal fluid collection. Visualized osseous structures are unremarkable. IMPRESSION: 1. Nonobstructive bowel gas pattern. 2. Enteric tube present in the stomach. Proximal side holes are likely at or near the level of the gastroesophageal junction. Consider advancing 5-10 cm for optimal radiographic positioning. Electronically Signed   By: Franki Cabot M.D.   On: 07/17/2018 18:05   Dg Abdomen 1 View  Result Date: 07/22/2018 CLINICAL DATA:  OG tube placement. EXAM: ABDOMEN - 1 VIEW COMPARISON:  None. FINDINGS: OG tube appears to be adequately positioned within the stomach, although the tip is difficult to visualize at the lower portion of the exam. IMPRESSION: OG tube appears to be adequately positioned within the stomach. Electronically Signed   By: Franki Cabot M.D.   On: 07/19/2018 13:39   Dg Chest Port 1 View  Result Date: 07/24/2018 CLINICAL DATA:  Acute respiratory failure EXAM: PORTABLE CHEST 1 VIEW COMPARISON:  Yesterday FINDINGS: Endotracheal tube  tip between the clavicular heads and carina. The orogastric tube at least reaches the stomach. Stable streaky densities at the bases. There is history of pneumonia. No Kerley lines, effusion, or pneumothorax. Apical emphysema. IMPRESSION: Stable hardware positioning and lower lobe pneumonia. Electronically Signed   By: Monte Fantasia M.D.   On: 07/24/2018 05:04   Dg Chest Portable 1 View  Result Date: 07/07/2018 CLINICAL DATA:  Status post intubation and NG tube placement. EXAM: PORTABLE CHEST 1 VIEW COMPARISON:  Single-view the chest 05/20/2016 and 05/17/2016. CT chest 05/18/2016. FINDINGS: Endotracheal tube is in place with the tip in good position 3.5 cm above the clavicular heads. The lungs are emphysematous. Bibasilar airspace disease appears slightly worse on the right. Heart size is normal. No pneumothorax or pleural effusion. No acute bony abnormality. IMPRESSION: ETT in good position. Bibasilar airspace disease most compatible with pneumonia is somewhat worse on the right. Emphysema. Electronically Signed   By: Inge Rise M.D.  On: 07/18/2018 13:39      Indwelling Urinary Catheter continued, requirement due to   Reason to continue Indwelling Urinary Catheter for strict Intake/Output monitoring for hemodynamic instability         Ventilator continued, requirement due to, resp failure    Ventilator Sedation RASS 0 to -2   CBC    Component Value Date/Time   WBC 19.5 (H) 07/24/2018 0540   RBC 3.89 (L) 07/24/2018 0540   HGB 11.4 (L) 07/24/2018 0540   HGB 12.7 (L) 05/04/2018 1538   HCT 38.0 (L) 07/24/2018 0540   HCT 37.6 05/04/2018 1538   PLT 273 07/24/2018 0540   PLT 245 05/04/2018 1538   MCV 97.7 07/24/2018 0540   MCV 90 05/04/2018 1538   MCV 89 06/12/2013 0458   MCH 29.3 07/24/2018 0540   MCHC 30.0 07/24/2018 0540   RDW 14.5 07/24/2018 0540   RDW 17.1 (H) 05/04/2018 1538   RDW 15.6 (H) 06/12/2013 0458   LYMPHSABS 0.3 (L) 07/22/2018 1253   LYMPHSABS 1.1 05/04/2018  1538   LYMPHSABS 2.0 06/12/2013 0458   MONOABS 0.5 07/19/2018 1253   MONOABS 1.1 (H) 06/12/2013 0458   EOSABS 0.0 07/20/2018 1253   EOSABS 0.0 05/04/2018 1538   EOSABS 0.1 06/12/2013 0458   BASOSABS 0.0 07/20/2018 1253   BASOSABS 0.1 05/04/2018 1538   BASOSABS 0.1 06/12/2013 0458   BMP Latest Ref Rng & Units 07/24/2018 07/30/2018 09/12/2017  Glucose 70 - 99 mg/dL 146(H) 161(H) 102(H)  BUN 8 - 23 mg/dL 38(H) 23 17  Creatinine 0.61 - 1.24 mg/dL 0.90 0.82 0.92  BUN/Creat Ratio 10 - 24 - - 18  Sodium 135 - 145 mmol/L 137 135 139  Potassium 3.5 - 5.1 mmol/L 5.7(H) 5.3(H) 4.7  Chloride 98 - 111 mmol/L 98 95(L) 97  CO2 22 - 32 mmol/L 32 31 23  Calcium 8.9 - 10.3 mg/dL 8.5(L) 8.4(L) 9.8     ASSESSMENT AND PLAN SYNOPSIS  Acute Hypoxic Respiratory Failure     - bilateral atypical/viral/community aquired pneumonia versus severe COPD exacerbation possibly secondary to viral URI as multiple sick contacts exposure  R/O COVID  Severe Hypoxic and Hypercapnic Respiratory Failure -continue Full MV support -continue Bronchodilator Therapy -Wean Fio2 and PEEP as tolerated -will perform SAT/SBT when respiratory parameters are met   SEVERE COPD EXACERBATION -continue IV steroids as prescribed -continue NEB THERAPY as prescribed -morphine as needed -wean fio2 as needed and tolerated   -follow chem 7 -follow UO -continue Foley Catheter-assess need daily   NEUROLOGY - intubated and sedated - minimal sedation to achieve a RASS goal: -1 Wake up assessment pending    CARDIAC ICU monitoring  ID -continue IV abx as prescibed -follow up cultures  GI/Nutrition GI PROPHYLAXIS as indicated Constipation protocol as indicated  ENDO - ICU hypoglycemic\Hyperglycemia protocol -check FSBS per protocol   ELECTROLYTES -follow labs as needed -replace as needed -pharmacy consultation and following   DVT/GI PRX ordered TRANSFUSIONS AS NEEDED MONITOR FSBS ASSESS the need for LABS as  needed   Critical Care Time devoted to patient care services described in this note is 32 minutes.   Overall, patient is critically ill, prognosis is guarded.  Patient with Multiorgan failure and at high risk for cardiac arrest and death.    Corrin Parker, M.D.  [facility]  Medical Director [place] Director [facility] Cardio-Pulmonary Department

## 2018-07-24 NOTE — Progress Notes (Signed)
Clinical status relayed to family  Updated and notified of patients medical condition-  Severe respiratory failure with very low chance of meaningful recovery.  Patient is in dying  Process.  Family understands the situation.  They have consented and agreed to DNR.    Family are satisfied with Plan of action and management. All questions answered  Lucie Leather, M.D.  [facility]  Medical Director [facility] Medical Director [facility] Cardio-Pulmonary Department

## 2018-07-25 LAB — CBC
HCT: 36.4 % — ABNORMAL LOW (ref 39.0–52.0)
Hemoglobin: 11.1 g/dL — ABNORMAL LOW (ref 13.0–17.0)
MCH: 29.6 pg (ref 26.0–34.0)
MCHC: 30.5 g/dL (ref 30.0–36.0)
MCV: 97.1 fL (ref 80.0–100.0)
Platelets: 275 10*3/uL (ref 150–400)
RBC: 3.75 MIL/uL — ABNORMAL LOW (ref 4.22–5.81)
RDW: 14.4 % (ref 11.5–15.5)
WBC: 19.3 10*3/uL — ABNORMAL HIGH (ref 4.0–10.5)
nRBC: 0 % (ref 0.0–0.2)

## 2018-07-25 LAB — CULTURE, RESPIRATORY W GRAM STAIN

## 2018-07-25 LAB — GLUCOSE, CAPILLARY
Glucose-Capillary: 143 mg/dL — ABNORMAL HIGH (ref 70–99)
Glucose-Capillary: 139 mg/dL — ABNORMAL HIGH (ref 70–99)
Glucose-Capillary: 127 mg/dL — ABNORMAL HIGH (ref 70–99)

## 2018-07-25 LAB — MAGNESIUM: Magnesium: 2.7 mg/dL — ABNORMAL HIGH (ref 1.7–2.4)

## 2018-07-25 LAB — BASIC METABOLIC PANEL
Anion gap: 4 — ABNORMAL LOW (ref 5–15)
BUN: 51 mg/dL — ABNORMAL HIGH (ref 8–23)
CO2: 35 mmol/L — ABNORMAL HIGH (ref 22–32)
Calcium: 8.3 mg/dL — ABNORMAL LOW (ref 8.9–10.3)
Chloride: 101 mmol/L (ref 98–111)
Creatinine, Ser: 0.83 mg/dL (ref 0.61–1.24)
GFR calc Af Amer: >60 mL/min (ref >60)
GFR calc non Af Amer: >60 mL/min (ref >60)
GLUCOSE: 160 mg/dL — AB (ref 70–99)
Potassium: 5.1 mmol/L (ref 3.5–5.1)
Sodium: 140 mmol/L (ref 135–145)

## 2018-07-25 LAB — PHOSPHORUS: Phosphorus: 2.6 mg/dL (ref 2.5–4.6)

## 2018-07-25 LAB — HIV ANTIBODY (ROUTINE TESTING W REFLEX): HIV Screen 4th Generation wRfx: NONREACTIVE

## 2018-07-25 LAB — PROCALCITONIN: Procalcitonin: <0.1 ng/mL

## 2018-07-25 MED ORDER — DIGOXIN 250 MCG PO TABS
0.2500 mg | ORAL_TABLET | ORAL | Status: DC
  Administered 2018-07-26 – 2018-07-27 (×2): 0.25 mg via ORAL
  Filled 2018-07-25 (×3): qty 1

## 2018-07-25 MED ORDER — IPRATROPIUM-ALBUTEROL 20-100 MCG/ACT IN AERS
1.0000 | INHALATION_SPRAY | Freq: Two times a day (BID) | RESPIRATORY_TRACT | Status: DC
  Filled 2018-07-25: qty 4

## 2018-07-25 MED ORDER — IPRATROPIUM-ALBUTEROL 20-100 MCG/ACT IN AERS
1.0000 | INHALATION_SPRAY | Freq: Four times a day (QID) | RESPIRATORY_TRACT | Status: DC | PRN
  Filled 2018-07-25: qty 4

## 2018-07-25 MED ORDER — DIGOXIN 250 MCG PO TABS
0.2500 mg | ORAL_TABLET | Freq: Once | ORAL | Status: DC
  Filled 2018-07-25: qty 1

## 2018-07-25 MED ORDER — BUDESONIDE 0.5 MG/2ML IN SUSP
0.5000 mg | Freq: Two times a day (BID) | RESPIRATORY_TRACT | Status: DC
  Administered 2018-07-25 – 2018-07-27 (×5): 0.5 mg via RESPIRATORY_TRACT
  Filled 2018-07-25 (×6): qty 2

## 2018-07-25 MED ORDER — ACETAZOLAMIDE SODIUM 500 MG IJ SOLR
500.0000 mg | Freq: Once | INTRAMUSCULAR | Status: AC
  Administered 2018-07-25: 500 mg via INTRAVENOUS
  Filled 2018-07-25: qty 500

## 2018-07-25 MED ORDER — PIVOT 1.5 CAL PO LIQD: 1000.0000 mL | ORAL | Status: DC

## 2018-07-25 MED ORDER — IPRATROPIUM-ALBUTEROL 0.5-2.5 (3) MG/3ML IN SOLN
3.0000 mL | Freq: Four times a day (QID) | RESPIRATORY_TRACT | Status: DC
  Administered 2018-07-25 – 2018-07-28 (×12): 3 mL via RESPIRATORY_TRACT
  Filled 2018-07-25 (×12): qty 3

## 2018-07-25 NOTE — Progress Notes (Signed)
[facility] - [place] at [facility]   PATIENT NAME: [name]    MR#:  [id number]  DATE OF BIRTH:  1950-06-20  SUBJECTIVE:  CHIEF COMPLAINT:   Chief Complaint  Patient presents with  . Respiratory Distress   Patient intubated and sedated.  REVIEW OF SYSTEMS:    Review of Systems  Unable to perform ROS: Intubated   DRUG ALLERGIES:   Allergies  Allergen Reactions  . Promethazine Other (See Comments)    Caused pt to become disoriented after receiving 6.25mg  IV. Caused pt to become disoriented after receiving 6.25mg  IV.  Marland Kitchen Aspirin     Other reaction(s): Asthma flare up  . Sulfa Antibiotics     VITALS:  Blood pressure (!) 139/53, pulse 77, temperature 98.6 F (37 C), temperature source Oral, resp. rate 15, height 5\' 6"  (1.676 m), weight (!) 144.6 kg, SpO2 96 %.  PHYSICAL EXAMINATION:   Physical Exam  GENERAL:  68 y.o.-year-old patient lying in the bed sedated HEENT: ETT in place EXTREMITIES: Not moving extremites PSYCHIATRIC: Sedated  LABORATORY PANEL:   CBC Recent Labs  Lab 07/25/18 0430  WBC 19.3*  HGB 11.1*  HCT 36.4*  PLT 275   ------------------------------------------------------------------------------------------------------------------ Chemistries  Recent Labs  Lab 07/04/2018 1403  07/25/18 0430  NA 135   < > 140  K 5.3*   < > 5.1  CL 95*   < > 101  CO2 31   < > 35*  GLUCOSE 161*   < > 160*  BUN 23   < > 51*  CREATININE 0.82   < > 0.83  CALCIUM 8.4*   < > 8.3*  MG  --    < > 2.7*  AST 33  --   --   ALT 34  --   --   ALKPHOS 68  --   --   BILITOT 0.7  --   --    < > = values in this interval not displayed.   ------------------------------------------------------------------------------------------------------------------  Cardiac Enzymes Recent Labs  Lab 07/16/2018 1403  TROPONINI 0.12*    ------------------------------------------------------------------------------------------------------------------  RADIOLOGY:  Dg Abd 1 View  Result Date: 07/30/2018 CLINICAL DATA:  GI bleed. EXAM: ABDOMEN - 1 VIEW COMPARISON:  None. FINDINGS: Enteric tube is present in the stomach. Proximal side holes are likely at or near the level of the gastroesophageal junction. Visualized bowel gas pattern is nonobstructive. No evidence of free intraperitoneal air appreciated. No evidence of abnormal fluid collection. Visualized osseous structures are unremarkable. IMPRESSION: 1. Nonobstructive bowel gas pattern. 2. Enteric tube present in the stomach. Proximal side holes are likely at or near the level of the gastroesophageal junction. Consider advancing 5-10 cm for optimal radiographic positioning. Electronically Signed   By: Bary Richard M.D.   On: 07/26/2018 18:05   Dg Chest Port 1 View  Result Date: 07/24/2018 CLINICAL DATA:  Acute respiratory failure EXAM: PORTABLE CHEST 1 VIEW COMPARISON:  Yesterday FINDINGS: Endotracheal tube tip between the clavicular heads and carina. The orogastric tube at least reaches the stomach. Stable streaky densities at the bases. There is history of pneumonia. No Kerley lines, effusion, or pneumothorax. Apical emphysema. IMPRESSION: Stable hardware positioning and lower lobe pneumonia. Electronically Signed   By: Marnee Spring M.D.   On: 07/24/2018 05:04     ASSESSMENT AND PLAN:   Patient 68 year old with COPD and chronic respiratory failure who is presenting with acute respiratory failure  * Acute respiratory failure suspect due to acute COPD exacerbation as well as  pneumonia  IV antibiotics Continue full ventilatory support COVID 19 pending  *  Acute on Chronic diastolic CHF  *  COPD with acute exacerbation we will treat with nebs and steroids  *  Hypertension we will hold antihypertensive  * Anemia of chronic disease  *  lovenox for dvt  proph  All the records are reviewed and case discussed with Care Management/Social Worker Management plans discussed with the patient, family and they are in agreement.  CODE STATUS: FULL CODE  TOTAL TIME TAKING CARE OF THIS PATIENT: 20 minutes.   Molinda Bailiff Anica Alcaraz M.D on 07/25/2018 at 1:59 PM  Between 7am to 6pm - Pager - [phone]  After 6pm go to [email or web address] - password [email or web address] [facility]  [facility] [place] [facility]  Office  [phone]  CC: Primary care physician; Tamsen Roers, PA  Note: This dictation was prepared with Dragon dictation along with smaller phrase technology. Any transcriptional errors that result from this process are unintentional.

## 2018-07-25 NOTE — TOC Initial Note (Signed)
Transition of Care [facility]) - Initial/Assessment Note    Patient Details  Name: [name] MRN: [id number] Date of Birth: [birth year]  Transition of Care [facility]) CM/SW Contact:    Allayne Butcher, RN Phone Number: 07/25/2018, 12:03 PM  Clinical Narrative:                 Patient admitted to the hospital with RSV pneumonia and is being ruled out for COVID 19.  Patient is currently in the ICU intubated, sedated, and on airborne precautions.  RNCM was able to speak with wife, patient lives at home with wife and grandson.  Patient has COPD and is on chronic O2 at 3L from [employer].  Wife reports that the patient is independent at baseline.  RNCM instructed wife to reach out with any questions hospital caregivers will be happy to update her and answer any questions.   Expected Discharge Plan: Home w Home Health Services     Patient Goals and CMS Choice        Expected Discharge Plan and Services Expected Discharge Plan: Home w Home Health Services   Discharge Planning Services: CM Consult   Living arrangements for the past 2 months: Single Family Home                          Prior Living Arrangements/Services Living arrangements for the past 2 months: Single Family Home Lives with:: Spouse, Relatives(wife and grandson) Patient language and need for interpreter reviewed:: No Do you feel safe going back to the place where you live?: Yes      Need for Family Participation in Patient Care: Yes (Comment)(wife ) Care giver support system in place?: Yes (comment) Current home services: DME(oxygen from [facility]) Criminal Activity/Legal Involvement Pertinent to Current Situation/Hospitalization: No - Comment as needed  Activities of Daily Living      Permission Sought/Granted Permission sought to share information with : Facility Medical sales representative    Share Information with NAME: Stanton Kidney patient's wife           Emotional Assessment Appearance:: Appears stated  age Attitude/Demeanor/Rapport: Sedated Affect (typically observed): Unable to Assess   Alcohol / Substance Use: Not Applicable Psych Involvement: No (comment)  Admission diagnosis:  Acute respiratory failure with hypoxia (HCC) [J96.01] Anemia, unspecified type [D64.9] Community acquired pneumonia, unspecified laterality [J18.9] Patient Active Problem List   Diagnosis Date Noted  . PNA (pneumonia) 07/21/2018  . Acute upper respiratory infection, unspecified 05/12/2017  . Vitamin D deficiency, unspecified 03/28/2017  . Osteoarthritis of both knees 03/28/2017  . DDD (degenerative disc disease), lumbar 03/28/2017  . Elevated rheumatoid factor 01/12/2017  . Arthralgia 01/12/2017  . Influenzal pneumonia 05/17/2016  . A-fib (HCC) 10/03/2014  . Chronic asthmatic bronchitis with acute exacerbation (HCC) 10/03/2014  . Chronic respiratory failure (HCC) 10/03/2014  . CAFL (chronic airflow limitation) (HCC) 10/03/2014  . Cervical osteoarthritis 10/03/2014  . Chronic obstructive pulmonary emphysema (HCC) 10/03/2014  . Fatigue 10/03/2014  . History of atrial fibrillation 10/03/2014  . Obstructive apnea 10/03/2014  . Disturbance of skin sensation 10/03/2014  . Edema, peripheral 10/03/2014  . CCF (congestive cardiac failure) (HCC) 03/26/2014  . Chronic obstructive pulmonary disease (HCC) 09/21/2013  . Carbuncle and furuncle of buttock 12/04/2009  . Elevated WBC count 01/09/2009  . Hypoxemia 12/10/2008  . 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 Accumulation of fluid in tissues 03/31/2006  . Esophagitis, reflux 03/31/2006  . Decreased potassium in the blood 03/31/2006  .  Adiposity 03/31/2006  . Primary localized osteoarthrosis, lower leg 03/31/2006  . Apnea, sleep 03/31/2006   PCP:  Tamsen Roers, PA Pharmacy:   [facility], [place] - [address] [place] [address] Phone: [phone] Fax: [phone]  [facility] - [place], [place] - [address] [place] [address] Phone: [phone] Fax: [phone]  [facility] [address] [place] - [address] [place] [address] Phone: [phone] Fax: [phone]     Social Determinants of Health (SDOH) Interventions    Readmission Risk Interventions No flowsheet data found.

## 2018-07-25 NOTE — Progress Notes (Signed)
CRITICAL CARE NOTE  CC  follow up respiratory failure  SUBJECTIVE Patient remains critically ill Prognosis is guarded  Vent Mode: PRVC FiO2 (%):  [60 %-80 %] 60 % Set Rate:  [22 bmp] 22 bmp Vt Set:  [500 mL] 500 mL PEEP:  [10 cmH20] 10 cmH20 high vent settings Severe resp failure      SIGNIFICANT EVENTS 3.22 intubated 3.23 high vent settings   REVIEW OF SYSTEMS  PATIENT IS UNABLE TO PROVIDE COMPLETE REVIEW OF SYSTEMS DUE TO SEVERE CRITICAL ILLNESS      CBC    Component Value Date/Time   WBC 19.3 (H) 07/25/2018 0430   RBC 3.75 (L) 07/25/2018 0430   HGB 11.1 (L) 07/25/2018 0430   HGB 12.7 (L) 05/04/2018 1538   HCT 36.4 (L) 07/25/2018 0430   HCT 37.6 05/04/2018 1538   PLT 275 07/25/2018 0430   PLT 245 05/04/2018 1538   MCV 97.1 07/25/2018 0430   MCV 90 05/04/2018 1538   MCV 89 06/12/2013 0458   MCH 29.6 07/25/2018 0430   MCHC 30.5 07/25/2018 0430   RDW 14.4 07/25/2018 0430   RDW 17.1 (H) 05/04/2018 1538   RDW 15.6 (H) 06/12/2013 0458   LYMPHSABS 0.3 (L) 07/30/2018 1253   LYMPHSABS 1.1 05/04/2018 1538   LYMPHSABS 2.0 06/12/2013 0458   MONOABS 0.5 07/15/2018 1253   MONOABS 1.1 (H) 06/12/2013 0458   EOSABS 0.0 07/16/2018 1253   EOSABS 0.0 05/04/2018 1538   EOSABS 0.1 06/12/2013 0458   BASOSABS 0.0 07/25/2018 1253   BASOSABS 0.1 05/04/2018 1538   BASOSABS 0.1 06/12/2013 0458   BMP Latest Ref Rng & Units 07/25/2018 07/24/2018 07/24/2018  Glucose 70 - 99 mg/dL 160(H) - -  BUN 8 - 23 mg/dL 51(H) - -  Creatinine 0.61 - 1.24 mg/dL 0.83 - -  BUN/Creat Ratio 10 - 24 - - -  Sodium 135 - 145 mmol/L 140 - -  Potassium 3.5 - 5.1 mmol/L 5.1 5.1 5.4(H)  Chloride 98 - 111 mmol/L 101 - -  CO2 22 - 32 mmol/L 35(H) - -  Calcium 8.9 - 10.3 mg/dL 8.3(L) - -        Indwelling Urinary Catheter continued, requirement due to   Reason to continue Indwelling Urinary Catheter for strict Intake/Output monitoring for hemodynamic instability         Ventilator continued,  requirement due to, resp failure    Ventilator Sedation RASS 0 to -2     ASSESSMENT AND PLAN SYNOPSIS   Severe Hypoxic and Hypercapnic Respiratory Failure from RSV pneumonia And COPD exacerbation -continue Full MV support -continue Bronchodilator Therapy -Wean Fio2 and PEEP as tolerated -will perform SAT/SBT when respiratory parameters are met  SEVERE COPD EXACERBATION -continue IV steroids as prescribed -continue NEB THERAPY as prescribed -morphine as needed -wean fio2 as needed and tolerated   -follow chem 7 -follow UO -continue Foley Catheter-assess need daily -Will give of diamox and assess ouput  NEUROLOGY - intubated and sedated - minimal sedation to achieve a RASS goal: -1 Wake up assessment pending  CARDIAC ICU monitoring  ID -continue IV abx as prescibed -follow up cultures  GI/Nutrition GI PROPHYLAXIS as indicated DIET-->TF's as tolerated Constipation protocol as indicated  ENDO - ICU hypoglycemic\Hyperglycemia protocol -check FSBS per protocol   ELECTROLYTES -follow labs as needed -replace as needed -pharmacy consultation and following   DVT/GI PRX ordered TRANSFUSIONS AS NEEDED MONITOR FSBS ASSESS the need for LABS as needed   Critical Care Time devoted to patient care  services described in this note is 35  minutes.   Overall, patient is critically ill, prognosis is guarded.  Patient with Multiorgan failure and at high risk for cardiac arrest and death.    Corrin Parker, M.D.  [facility]  Medical Director [place] Director [facility] Cardio-Pulmonary Department

## 2018-07-26 DIAGNOSIS — J441 Chronic obstructive pulmonary disease with (acute) exacerbation: Secondary | ICD-10-CM

## 2018-07-26 LAB — CBC
HEMATOCRIT: 37.9 % — AB (ref 39.0–52.0)
Hemoglobin: 11 g/dL — ABNORMAL LOW (ref 13.0–17.0)
MCH: 29.6 pg (ref 26.0–34.0)
MCHC: 29 g/dL — ABNORMAL LOW (ref 30.0–36.0)
MCV: 101.9 fL — ABNORMAL HIGH (ref 80.0–100.0)
Platelets: 248 10*3/uL (ref 150–400)
RBC: 3.72 MIL/uL — ABNORMAL LOW (ref 4.22–5.81)
RDW: 14.7 % (ref 11.5–15.5)
WBC: 17.6 10*3/uL — ABNORMAL HIGH (ref 4.0–10.5)
nRBC: 0 % (ref 0.0–0.2)

## 2018-07-26 LAB — GLUCOSE, CAPILLARY
Glucose-Capillary: 139 mg/dL — ABNORMAL HIGH (ref 70–99)
Glucose-Capillary: 144 mg/dL — ABNORMAL HIGH (ref 70–99)
Glucose-Capillary: 158 mg/dL — ABNORMAL HIGH (ref 70–99)
Glucose-Capillary: 160 mg/dL — ABNORMAL HIGH (ref 70–99)
Glucose-Capillary: 162 mg/dL — ABNORMAL HIGH (ref 70–99)

## 2018-07-26 LAB — BASIC METABOLIC PANEL
Anion gap: 6 (ref 5–15)
BUN: 39 mg/dL — ABNORMAL HIGH (ref 8–23)
CO2: 34 mmol/L — ABNORMAL HIGH (ref 22–32)
CREATININE: 0.7 mg/dL (ref 0.61–1.24)
Calcium: 8.2 mg/dL — ABNORMAL LOW (ref 8.9–10.3)
Chloride: 101 mmol/L (ref 98–111)
GFR calc Af Amer: >60 mL/min (ref >60)
GFR calc non Af Amer: >60 mL/min (ref >60)
GLUCOSE: 152 mg/dL — AB (ref 70–99)
Potassium: 4.9 mmol/L (ref 3.5–5.1)
Sodium: 141 mmol/L (ref 135–145)

## 2018-07-26 LAB — MAGNESIUM: Magnesium: 2.4 mg/dL (ref 1.7–2.4)

## 2018-07-26 LAB — PHOSPHORUS: Phosphorus: 1.4 mg/dL — ABNORMAL LOW (ref 2.5–4.6)

## 2018-07-26 LAB — LEGIONELLA PNEUMOPHILA SEROGP 1 UR AG: L. pneumophila Serogp 1 Ur Ag: NEGATIVE

## 2018-07-26 MED ORDER — STERILE WATER FOR INJECTION IJ SOLN
INTRAMUSCULAR | Status: AC
  Administered 2018-07-26: 11:00:00
  Filled 2018-07-26: qty 10

## 2018-07-26 MED ORDER — SODIUM CHLORIDE 0.9 % IV SOLN
INTRAVENOUS | Status: DC | PRN
  Administered 2018-07-26: 250 mL via INTRAVENOUS

## 2018-07-26 MED ORDER — SODIUM PHOSPHATES 45 MMOLE/15ML IV SOLN
30.0000 mmol | Freq: Once | INTRAVENOUS | Status: AC
  Administered 2018-07-26: 30 mmol via INTRAVENOUS
  Filled 2018-07-26: qty 10

## 2018-07-26 MED ORDER — ACETAMINOPHEN 160 MG/5ML PO SOLN
650.0000 mg | ORAL | Status: DC | PRN
  Administered 2018-07-26 – 2018-07-28 (×3): 650 mg
  Filled 2018-07-26 (×4): qty 20.3

## 2018-07-26 MED ORDER — INSULIN ASPART 100 UNIT/ML ~~LOC~~ SOLN
0.0000 [IU] | SUBCUTANEOUS | Status: DC
  Administered 2018-07-26: 3 [IU] via SUBCUTANEOUS
  Administered 2018-07-26: 2 [IU] via SUBCUTANEOUS
  Administered 2018-07-26: 3 [IU] via SUBCUTANEOUS
  Administered 2018-07-27: 2 [IU] via SUBCUTANEOUS
  Administered 2018-07-27: 3 [IU] via SUBCUTANEOUS
  Administered 2018-07-27 (×4): 2 [IU] via SUBCUTANEOUS
  Administered 2018-07-28 (×2): 3 [IU] via SUBCUTANEOUS
  Filled 2018-07-26 (×12): qty 1

## 2018-07-26 MED ORDER — ACETAZOLAMIDE SODIUM 500 MG IJ SOLR
500.0000 mg | Freq: Once | INTRAMUSCULAR | Status: AC
  Administered 2018-07-26: 500 mg via INTRAVENOUS
  Filled 2018-07-26: qty 500

## 2018-07-26 NOTE — Progress Notes (Signed)
CRITICAL CARE NOTE  CC  Follow up severe resp failure  SUBJECTIVE On vent Remains critically ill +RSV +Moraxella    Vent Mode: PRVC FiO2 (%):  [50 %-70 %] 50 % Set Rate:  [22 bmp] 22 bmp Vt Set:  [500 mL] 500 mL PEEP:  [10 cmH20] 10 cmH20 Plateau Pressure:  [20 cmH20-21 cmH20] 21 cmH20  High vent settings      SIGNIFICANT EVENTS 3.22 intubated for COPD exacerbation 3.23 high vent settings 3.25 RSV and Moraxella pneumonia-remains intubated   REVIEW OF SYSTEMS  PATIENT IS UNABLE TO PROVIDE COMPLETE REVIEW OF SYSTEMS DUE TO SEVERE CRITICAL ILLNESS      CBC    Component Value Date/Time   WBC 17.6 (H) 07/26/2018 0518   RBC 3.72 (L) 07/26/2018 0518   HGB 11.0 (L) 07/26/2018 0518   HGB 12.7 (L) 05/04/2018 1538   HCT 37.9 (L) 07/26/2018 0518   HCT 37.6 05/04/2018 1538   PLT 248 07/26/2018 0518   PLT 245 05/04/2018 1538   MCV 101.9 (H) 07/26/2018 0518   MCV 90 05/04/2018 1538   MCV 89 06/12/2013 0458   MCH 29.6 07/26/2018 0518   MCHC 29.0 (L) 07/26/2018 0518   RDW 14.7 07/26/2018 0518   RDW 17.1 (H) 05/04/2018 1538   RDW 15.6 (H) 06/12/2013 0458   LYMPHSABS 0.3 (L) 07/22/2018 1253   LYMPHSABS 1.1 05/04/2018 1538   LYMPHSABS 2.0 06/12/2013 0458   MONOABS 0.5 07/08/2018 1253   MONOABS 1.1 (H) 06/12/2013 0458   EOSABS 0.0 07/08/2018 1253   EOSABS 0.0 05/04/2018 1538   EOSABS 0.1 06/12/2013 0458   BASOSABS 0.0 07/13/2018 1253   BASOSABS 0.1 05/04/2018 1538   BASOSABS 0.1 06/12/2013 0458   BMP Latest Ref Rng & Units 07/26/2018 07/25/2018 07/24/2018  Glucose 70 - 99 mg/dL 130(Q) 657(Q) -  BUN 8 - 23 mg/dL 46(N) 62(X) -  Creatinine 0.61 - 1.24 mg/dL 5.28 4.13 -  BUN/Creat Ratio 10 - 24 - - -  Sodium 135 - 145 mmol/L 141 140 -  Potassium 3.5 - 5.1 mmol/L 4.9 5.1 5.1  Chloride 98 - 111 mmol/L 101 101 -  CO2 22 - 32 mmol/L 34(H) 35(H) -  Calcium 8.9 - 10.3 mg/dL 8.2(L) 8.3(L) -          Indwelling Urinary Catheter continued, requirement due to    Reason to continue Indwelling Urinary Catheter for strict Intake/Output monitoring for hemodynamic instability         Ventilator continued, requirement due to, resp failure    Ventilator Sedation RASS 0 to -2       ASSESSMENT AND PLAN SYNOPSIS  Severe Hypoxic and Hypercapnic Respiratory Failure From COPD exacerbation RSV and Moraxella pneumonia -continue Mechanical Ventilator support -continue Bronchodilator Therapy -Wean Fio2 and PEEP as tolerated -VAP/VENT bundle implementation   SEVERE COPD EXACERBATION -continue IV steroids as prescribed -continue NEB THERAPY as prescribed -morphine as needed -wean fio2 as needed and tolerated   -follow chem 7 -follow UO -continue Foley Catheter-assess need Diamox given Good oup    NEUROLOGY - intubated and sedated - minimal sedation to achieve a RASS goal: -1   INFECTIOUS DISEASE -continue antibiotics as prescribed -follow up cultures -follow up ID consultation COVID TEST PENDING    GI/Nutrition GI PROPHYLAXIS as indicated DIET-->TF's as tolerated Constipation protocol as indicated  ENDO - ICU hypoglycemic\Hyperglycemia protocol -check FSBS per protocol  ELECTROLYTES -follow labs as needed -replace as needed -pharmacy consultation and following    DVT/GI PRX ordered TRANSFUSIONS  AS NEEDED MONITOR FSBS ASSESS the need for LABS as needed    Critical Care Time devoted to patient care services described in this note is 32  minutes.   Overall, patient is critically ill, prognosis is guarded.  high risk for cardiac arrest and death. Patient is DNR   Lucie Leather, M.D.  [facility]  Medical Director [facility] Medical Director [facility] Cardio-Pulmonary Department

## 2018-07-26 NOTE — Progress Notes (Signed)
eLink Physician-Brief Progress Note Patient Name: [name] DOB: [birth year] MRN: [id number]   Date of Service  07/26/2018  HPI/Events of Note  Fever in the context of pneumonia and VDARF  eICU Interventions  Tylenol 650 mg via NG tube Q 4 hours prn fever        Migdalia Dk 07/26/2018, 12:59 AM

## 2018-07-26 NOTE — Progress Notes (Signed)
Pharmacy ICU Monitoring Consult:  Pharmacy consulted to assist in monitoring and replacing electrolytes in this 68 y.o. male admitted on 2018-08-01 with Respiratory Distress  Labs:  Sodium (mmol/L)  Date Value  07/26/2018 141  09/12/2017 139  06/12/2013 140   Potassium (mmol/L)  Date Value  07/26/2018 4.9  06/12/2013 3.7   Magnesium (mg/dL)  Date Value  03/54/6568 2.4  05/06/2013 2.4   Phosphorus (mg/dL)  Date Value  12/75/1700 1.4 (L)  05/06/2013 3.0   Calcium (mg/dL)  Date Value  17/49/4496 8.2 (L)   Calcium, Total (mg/dL)  Date Value  75/91/6384 8.4 (L)   Albumin (g/dL)  Date Value  66/59/9357 3.2 (L)  09/12/2017 4.6  06/12/2013 2.8 (L)    Assessment/Plan: 1. Electrolytes: patient ordered Diamox 500mg  IV x 1. Will replace sodium phosphate IV x 1.   2. Glucose: will initiate SSI Q4hr.   3. Constipation: senna/docusate BID and Miralax initiated on 3/24. Will continue. If no bowel regimen overnight, will adjust regimen in am.   Pharmacy will continue to monitor and adjust per consult.   Kevona Lupinacci L 07/26/2018 4:12 PM

## 2018-07-26 NOTE — Plan of Care (Signed)
Remains on Airborne/Contact precautions pending COVID-19 results. Still requiring 10 PEEP, not ready to wean. Remains febrile.     Problem: Education: Goal: Knowledge of General Education information will improve Description Including pain rating scale, medication(s)/side effects and non-pharmacologic comfort measures Outcome: Progressing   Problem: Health Behavior/Discharge Planning: Goal: Ability to manage health-related needs will improve Outcome: Progressing   Problem: Clinical Measurements: Goal: Ability to maintain clinical measurements within normal limits will improve Outcome: Progressing Goal: Will remain free from infection Outcome: Progressing Goal: Diagnostic test results will improve Outcome: Progressing Goal: Respiratory complications will improve Outcome: Progressing Goal: Cardiovascular complication will be avoided Outcome: Progressing   Problem: Activity: Goal: Risk for activity intolerance will decrease Outcome: Progressing   Problem: Nutrition: Goal: Adequate nutrition will be maintained Outcome: Progressing   Problem: Coping: Goal: Level of anxiety will decrease Outcome: Progressing   Problem: Elimination: Goal: Will not experience complications related to bowel motility Outcome: Progressing Goal: Will not experience complications related to urinary retention Outcome: Progressing   Problem: Pain Managment: Goal: General experience of comfort will improve Outcome: Progressing   Problem: Safety: Goal: Ability to remain free from injury will improve Outcome: Progressing   Problem: Skin Integrity: Goal: Risk for impaired skin integrity will decrease Outcome: Progressing

## 2018-07-27 DIAGNOSIS — J210 Acute bronchiolitis due to respiratory syncytial virus: Secondary | ICD-10-CM

## 2018-07-27 DIAGNOSIS — J156 Pneumonia due to other aerobic Gram-negative bacteria: Secondary | ICD-10-CM

## 2018-07-27 LAB — PHOSPHORUS: Phosphorus: 1.3 mg/dL — ABNORMAL LOW (ref 2.5–4.6)

## 2018-07-27 LAB — MAGNESIUM: MAGNESIUM: 2.4 mg/dL (ref 1.7–2.4)

## 2018-07-27 LAB — BASIC METABOLIC PANEL
Anion gap: 5 (ref 5–15)
BUN: 32 mg/dL — AB (ref 8–23)
CO2: 35 mmol/L — ABNORMAL HIGH (ref 22–32)
Calcium: 8.2 mg/dL — ABNORMAL LOW (ref 8.9–10.3)
Chloride: 102 mmol/L (ref 98–111)
Creatinine, Ser: 0.56 mg/dL — ABNORMAL LOW (ref 0.61–1.24)
GFR calc Af Amer: >60 mL/min (ref >60)
GFR calc non Af Amer: >60 mL/min (ref >60)
Glucose, Bld: 165 mg/dL — ABNORMAL HIGH (ref 70–99)
Potassium: 4.3 mmol/L (ref 3.5–5.1)
Sodium: 142 mmol/L (ref 135–145)

## 2018-07-27 LAB — GLUCOSE, CAPILLARY
Glucose-Capillary: 129 mg/dL — ABNORMAL HIGH (ref 70–99)
Glucose-Capillary: 134 mg/dL — ABNORMAL HIGH (ref 70–99)
Glucose-Capillary: 145 mg/dL — ABNORMAL HIGH (ref 70–99)
Glucose-Capillary: 140 mg/dL — ABNORMAL HIGH (ref 70–99)
Glucose-Capillary: 126 mg/dL — ABNORMAL HIGH (ref 70–99)
Glucose-Capillary: 159 mg/dL — ABNORMAL HIGH (ref 70–99)
Glucose-Capillary: 132 mg/dL — ABNORMAL HIGH (ref 70–99)

## 2018-07-27 LAB — CBC
HCT: 36.4 % — ABNORMAL LOW (ref 39.0–52.0)
Hemoglobin: 10.6 g/dL — ABNORMAL LOW (ref 13.0–17.0)
MCH: 28.8 pg (ref 26.0–34.0)
MCHC: 29.1 g/dL — ABNORMAL LOW (ref 30.0–36.0)
MCV: 98.9 fL (ref 80.0–100.0)
PLATELETS: 229 10*3/uL (ref 150–400)
RBC: 3.68 MIL/uL — ABNORMAL LOW (ref 4.22–5.81)
RDW: 14.9 % (ref 11.5–15.5)
WBC: 16.1 10*3/uL — ABNORMAL HIGH (ref 4.0–10.5)
nRBC: 0 % (ref 0.0–0.2)

## 2018-07-27 MED ORDER — VECURONIUM BROMIDE 10 MG IV SOLR
INTRAVENOUS | Status: AC
  Administered 2018-07-27: 18:00:00
  Filled 2018-07-27: qty 10

## 2018-07-27 MED ORDER — FENTANYL CITRATE (PF) 100 MCG/2ML IJ SOLN
50.0000 ug | Freq: Once | INTRAMUSCULAR | Status: AC
  Administered 2018-07-27: 50 ug via INTRAVENOUS
  Filled 2018-07-27: qty 2

## 2018-07-27 MED ORDER — DILTIAZEM HCL 30 MG PO TABS
30.0000 mg | ORAL_TABLET | Freq: Four times a day (QID) | ORAL | Status: DC
  Administered 2018-07-27 – 2018-07-28 (×3): 30 mg via ORAL
  Filled 2018-07-27 (×3): qty 1

## 2018-07-27 MED ORDER — SODIUM PHOSPHATES 45 MMOLE/15ML IV SOLN
30.0000 mmol | Freq: Once | INTRAVENOUS | Status: AC
  Administered 2018-07-27: 30 mmol via INTRAVENOUS
  Filled 2018-07-27: qty 10

## 2018-07-27 NOTE — Progress Notes (Signed)
CRITICAL CARE NOTE  CC  Follow up resp failure  SUBJECTIVE On vent Remains critically ill +RSV and +Moraxella    Vent Mode: PRVC FiO2 (%):  [40 %-50 %] 40 % Set Rate:  [22 bmp] 22 bmp Vt Set:  [500 mL] 500 mL PEEP:  [5 cmH20-10 cmH20] 5 cmH20 Plateau Pressure:  [14 cmH20] 14 cmH20  Discussed with Wife regarding plan of care Plan for one way extubation when patient is ready.     SIGNIFICANT EVENTS 3.22 intubated for COPD exacerbation 3.23 high vent settings 3.25 RSV and Moraxella pneumonia-remains intubated 3.26 plan for SAT/SBT today   REVIEW OF SYSTEMS  PATIENT IS UNABLE TO PROVIDE COMPLETE REVIEW OF SYSTEMS DUE TO SEVERE CRITICAL ILLNESS      CBC    Component Value Date/Time   WBC 16.1 (H) 07/27/2018 0644   RBC 3.68 (L) 07/27/2018 0644   HGB 10.6 (L) 07/27/2018 0644   HGB 12.7 (L) 05/04/2018 1538   HCT 36.4 (L) 07/27/2018 0644   HCT 37.6 05/04/2018 1538   PLT 229 07/27/2018 0644   PLT 245 05/04/2018 1538   MCV 98.9 07/27/2018 0644   MCV 90 05/04/2018 1538   MCV 89 06/12/2013 0458   MCH 28.8 07/27/2018 0644   MCHC 29.1 (L) 07/27/2018 0644   RDW 14.9 07/27/2018 0644   RDW 17.1 (H) 05/04/2018 1538   RDW 15.6 (H) 06/12/2013 0458   LYMPHSABS 0.3 (L) 07/12/2018 1253   LYMPHSABS 1.1 05/04/2018 1538   LYMPHSABS 2.0 06/12/2013 0458   MONOABS 0.5 07/26/2018 1253   MONOABS 1.1 (H) 06/12/2013 0458   EOSABS 0.0 07/17/2018 1253   EOSABS 0.0 05/04/2018 1538   EOSABS 0.1 06/12/2013 0458   BASOSABS 0.0 07/22/2018 1253   BASOSABS 0.1 05/04/2018 1538   BASOSABS 0.1 06/12/2013 0458   BMP Latest Ref Rng & Units 07/27/2018 07/26/2018 07/25/2018  Glucose 70 - 99 mg/dL 165(H) 152(H) 160(H)  BUN 8 - 23 mg/dL 32(H) 39(H) 51(H)  Creatinine 0.61 - 1.24 mg/dL 0.56(L) 0.70 0.83  BUN/Creat Ratio 10 - 24 - - -  Sodium 135 - 145 mmol/L 142 141 140  Potassium 3.5 - 5.1 mmol/L 4.3 4.9 5.1  Chloride 98 - 111 mmol/L 102 101 101  CO2 22 - 32 mmol/L 35(H) 34(H) 35(H)  Calcium  8.9 - 10.3 mg/dL 8.2(L) 8.2(L) 8.3(L)         Indwelling Urinary Catheter continued, requirement due to   Reason to continue Indwelling Urinary Catheter for strict Intake/Output monitoring for hemodynamic instability         Ventilator continued, requirement due to, resp failure    Ventilator Sedation RASS 0 to -2        Indwelling Urinary Catheter continued, requirement due to   Reason to continue Indwelling Urinary Catheter for strict Intake/Output monitoring for hemodynamic instability         Ventilator continued, requirement due to, resp failure    Ventilator Sedation RASS 0 to -2       ASSESSMENT AND PLAN SYNOPSIS  Severe Hypoxic and Hypercapnic Respiratory Failure -continue Mechanical Ventilator support -continue Bronchodilator Therapy -Wean Fio2 and PEEP as tolerated -VAP/VENT bundle implementation -will perform SAT/SBT when respiratory parameters are met   SEVERE COPD EXACERBATION -continue IV steroids as prescribed -continue NEB THERAPY as prescribed -morphine as needed -wean fio2 as needed and tolerated    -follow chem 7 -follow UO -continue Foley Catheter-assess need Diamox as tolerated Good uop  INFECTIOUS DISEASE -continue antibiotics as prescribed -follow up  cultures -follow up ID consultation COVID test pending   GI/Nutrition GI PROPHYLAXIS as indicated DIET-->TF's as tolerated Constipation protocol as indicated  ENDO - ICU hypoglycemic\Hyperglycemia protocol -check FSBS per protocol   ELECTROLYTES -follow labs as needed -replace as needed -pharmacy consultation and following    DVT/GI PRX ordered TRANSFUSIONS AS NEEDED MONITOR FSBS ASSESS the need for LABS as needed     Critical Care Time devoted to patient care services described in this note is 31 minutes.   Overall, patient is critically ill, prognosis is guarded.  Patient with Multiorgan failure and at high risk for cardiac arrest and death.    Plan is for one way extubation When patient is ready   Corrin Parker, M.D.  [facility]  Medical Director [place] Director [facility] Cardio-Pulmonary Department

## 2018-07-27 NOTE — Progress Notes (Signed)
Pts RR in the 40's and spo2 89%. RT put pt back on a rate of 22 and increased Fio2 to 50%. Pt resting comfortabaly, will continue to monitor.

## 2018-07-27 NOTE — Progress Notes (Signed)
Fentanyl ggt turned off at 0925 and pt is calm tolerating SBT well. RN at bedside continuously monitoring pt.

## 2018-07-27 NOTE — Progress Notes (Signed)
Pharmacy ICU Monitoring Consult:  Pharmacy consulted to assist in monitoring and replacing electrolytes in this 68 y.o. male admitted on 08/13/18 with Respiratory Distress  Labs:  Sodium (mmol/L)  Date Value  07/27/2018 142  09/12/2017 139  06/12/2013 140   Potassium (mmol/L)  Date Value  07/27/2018 4.3  06/12/2013 3.7   Magnesium (mg/dL)  Date Value  29/47/6546 2.4  05/06/2013 2.4   Phosphorus (mg/dL)  Date Value  50/35/4656 1.3 (L)  05/06/2013 3.0   Calcium (mg/dL)  Date Value  81/27/5170 8.2 (L)   Calcium, Total (mg/dL)  Date Value  01/74/9449 8.4 (L)   Albumin (g/dL)  Date Value  67/59/1638 3.2 (L)  09/12/2017 4.6  06/12/2013 2.8 (L)    Assessment/Plan: 1. Electrolytes: patient ordered Diamox 500mg  IV x 1 on 3/24 and 3/25. Will replace sodium phosphate IV x 1. Will recheck electrolytes with am labs. Will replace for goal potassium ~4, goal magnesium ~ 2, and goal phosphorus > 2.5.   2. Glucose: Continue SSI Q4hr.   3. Constipation: senna/docusate BID and Miralax initiated on 3/24. Will continue. If no bowel regimen overnight, will adjust regimen in am.   Pharmacy will continue to monitor and adjust per consult.   Lexie Morini L 07/27/2018 8:22 PM

## 2018-07-28 ENCOUNTER — Inpatient Hospital Stay: Payer: Medicare Other

## 2018-07-28 DIAGNOSIS — J189 Pneumonia, unspecified organism: Secondary | ICD-10-CM

## 2018-07-28 DIAGNOSIS — J9600 Acute respiratory failure, unspecified whether with hypoxia or hypercapnia: Secondary | ICD-10-CM

## 2018-07-28 LAB — BASIC METABOLIC PANEL
Anion gap: 7 (ref 5–15)
BUN: 42 mg/dL — ABNORMAL HIGH (ref 8–23)
CO2: 31 mmol/L (ref 22–32)
Calcium: 8.1 mg/dL — ABNORMAL LOW (ref 8.9–10.3)
Chloride: 107 mmol/L (ref 98–111)
Creatinine, Ser: 1.89 mg/dL — ABNORMAL HIGH (ref 0.61–1.24)
GFR calc non Af Amer: 36 mL/min — ABNORMAL LOW (ref >60)
GFR, EST AFRICAN AMERICAN: 42 mL/min — AB (ref >60)
Glucose, Bld: 122 mg/dL — ABNORMAL HIGH (ref 70–99)
Potassium: 6 mmol/L — ABNORMAL HIGH (ref 3.5–5.1)
Sodium: 145 mmol/L (ref 135–145)

## 2018-07-28 LAB — CBC
HCT: 41.5 % (ref 39.0–52.0)
Hemoglobin: 11.6 g/dL — ABNORMAL LOW (ref 13.0–17.0)
MCH: 29.4 pg (ref 26.0–34.0)
MCHC: 28 g/dL — ABNORMAL LOW (ref 30.0–36.0)
MCV: 105.3 fL — ABNORMAL HIGH (ref 80.0–100.0)
NRBC: 0.3 % — AB (ref 0.0–0.2)
Platelets: 181 10*3/uL (ref 150–400)
RBC: 3.94 MIL/uL — ABNORMAL LOW (ref 4.22–5.81)
RDW: 15 % (ref 11.5–15.5)
WBC: 13.7 10*3/uL — ABNORMAL HIGH (ref 4.0–10.5)

## 2018-07-28 LAB — CULTURE, BLOOD (ROUTINE X 2)
CULTURE: NO GROWTH
Culture: NO GROWTH
Special Requests: ADEQUATE

## 2018-07-28 LAB — PHOSPHORUS: Phosphorus: 1.6 mg/dL — ABNORMAL LOW (ref 2.5–4.6)

## 2018-07-28 LAB — NOVEL CORONAVIRUS, NAA (HOSP ORDER, SEND-OUT TO REF LAB; TAT 18-24 HRS): SARS-CoV-2, NAA: NOT DETECTED

## 2018-07-28 LAB — MAGNESIUM: Magnesium: 2.4 mg/dL (ref 1.7–2.4)

## 2018-07-28 LAB — GLUCOSE, CAPILLARY
Glucose-Capillary: 158 mg/dL — ABNORMAL HIGH (ref 70–99)
Glucose-Capillary: 165 mg/dL — ABNORMAL HIGH (ref 70–99)

## 2018-07-28 MED ORDER — VECURONIUM BROMIDE 10 MG IV SOLR
INTRAVENOUS | Status: AC
  Administered 2018-07-28: 10 mg via INTRAVENOUS
  Filled 2018-07-28: qty 10

## 2018-07-28 MED ORDER — VECURONIUM BROMIDE 10 MG IV SOLR
10.0000 mg | Freq: Once | INTRAVENOUS | Status: AC
  Administered 2018-07-28: 10 mg via INTRAVENOUS

## 2018-07-28 MED ORDER — MIDAZOLAM HCL 2 MG/2ML IJ SOLN
2.0000 mg | Freq: Once | INTRAMUSCULAR | Status: AC
  Administered 2018-07-28: 2 mg via INTRAVENOUS

## 2018-07-28 MED ORDER — MIDAZOLAM HCL 2 MG/2ML IJ SOLN
INTRAMUSCULAR | Status: AC
  Administered 2018-07-28: 2 mg via INTRAVENOUS
  Filled 2018-07-28: qty 2

## 2018-08-02 NOTE — Progress Notes (Signed)
   08-09-2018 0300  Clinical Encounter Type  Visited With Family  Visit Type Initial;Spiritual support  Referral From Nurse  Consult/Referral To Chaplain  Spiritual Encounters  Spiritual Needs Prayer;Emotional;Grief support  Chaplain received PG for patients wife. Chaplain went to give pastoral care through comfort and prayer. Wife was very appreciative of Chaplain presence. Once Chaplain prayed wife share patient stated he wanted to go be with his Jesus. The wife made peace with what could happened next.

## 2018-08-02 NOTE — Death Summary Note (Signed)
DEATH SUMMARY   Patient Details  Name: [name] MRN: [id number] DOB: 12-09-1950  Admission/Discharge Information   Admit Date:  Aug 15, 2018  Date of Death:   08-20-2018   Time of Death:  0711  Length of Stay: 5  Referring Physician: Tamsen Roers, PA   Reason(s) for Hospitalization  Severe pneumonia and resp failure  Diagnoses  Preliminary cause of death: RSV pneumonia Moraxella pneumonia COPD exacerbation Secondary Diagnoses (including complications and co-morbidities):  Active Problems:   PNA (pneumonia)   Brief Hospital Course (including significant findings, care, treatment, and services provided and events leading to death)  Admitted for severe resp failure-vent support COPD exacerbation-steroids and ABX RSV and Moraxella Pneumonia Patient was made DNR by family  Patient with progressive decline in resp status and then passed away. Family notified of death Wife at bedside the night prior to death          Pertinent Labs and Studies  Significant Diagnostic Studies Dg Abd 1 View  Result Date: 08/15/18 CLINICAL DATA:  GI bleed. EXAM: ABDOMEN - 1 VIEW COMPARISON:  None. FINDINGS: Enteric tube is present in the stomach. Proximal side holes are likely at or near the level of the gastroesophageal junction. Visualized bowel gas pattern is nonobstructive. No evidence of free intraperitoneal air appreciated. No evidence of abnormal fluid collection. Visualized osseous structures are unremarkable. IMPRESSION: 1. Nonobstructive bowel gas pattern. 2. Enteric tube present in the stomach. Proximal side holes are likely at or near the level of the gastroesophageal junction. Consider advancing 5-10 cm for optimal radiographic positioning. Electronically Signed   By: Bary Richard M.D.   On: 08-15-18 18:05   Dg Abdomen 1 View  Result Date: 2018/08/15 CLINICAL DATA:  OG tube placement. EXAM: ABDOMEN - 1 VIEW COMPARISON:  None. FINDINGS: OG tube appears to be  adequately positioned within the stomach, although the tip is difficult to visualize at the lower portion of the exam. IMPRESSION: OG tube appears to be adequately positioned within the stomach. Electronically Signed   By: Bary Richard M.D.   On: 2018/08/15 13:39   Dg Chest Port 1 View  Result Date: 07/24/2018 CLINICAL DATA:  Acute respiratory failure EXAM: PORTABLE CHEST 1 VIEW COMPARISON:  Yesterday FINDINGS: Endotracheal tube tip between the clavicular heads and carina. The orogastric tube at least reaches the stomach. Stable streaky densities at the bases. There is history of pneumonia. No Kerley lines, effusion, or pneumothorax. Apical emphysema. IMPRESSION: Stable hardware positioning and lower lobe pneumonia. Electronically Signed   By: Marnee Spring M.D.   On: 07/24/2018 05:04   Dg Chest Portable 1 View  Result Date: 08/15/18 CLINICAL DATA:  Status post intubation and NG tube placement. EXAM: PORTABLE CHEST 1 VIEW COMPARISON:  Single-view the chest 05/20/2016 and 05/17/2016. CT chest 05/18/2016. FINDINGS: Endotracheal tube is in place with the tip in good position 3.5 cm above the clavicular heads. The lungs are emphysematous. Bibasilar airspace disease appears slightly worse on the right. Heart size is normal. No pneumothorax or pleural effusion. No acute bony abnormality. IMPRESSION: ETT in good position. Bibasilar airspace disease most compatible with pneumonia is somewhat worse on the right. Emphysema. Electronically Signed   By: Drusilla Kanner M.D.   On: 08-15-2018 13:39    Microbiology Recent Results (from the past 240 hour(s))  Respiratory Panel by PCR     Status: Abnormal   Collection Time: 2018-08-15 12:59 PM  Result Value Ref Range Status   Adenovirus NOT DETECTED NOT DETECTED Final  Coronavirus 229E NOT DETECTED NOT DETECTED Final    Comment: (NOTE) The Coronavirus on the Respiratory Panel, DOES NOT test for the novel  Coronavirus (2019 nCoV)    Coronavirus HKU1 NOT  DETECTED NOT DETECTED Final   Coronavirus NL63 NOT DETECTED NOT DETECTED Final   Coronavirus OC43 NOT DETECTED NOT DETECTED Final   Metapneumovirus NOT DETECTED NOT DETECTED Final   Rhinovirus / Enterovirus NOT DETECTED NOT DETECTED Final   Influenza A NOT DETECTED NOT DETECTED Final   Influenza B NOT DETECTED NOT DETECTED Final   Parainfluenza Virus 1 NOT DETECTED NOT DETECTED Final   Parainfluenza Virus 2 NOT DETECTED NOT DETECTED Final   Parainfluenza Virus 3 NOT DETECTED NOT DETECTED Final   Parainfluenza Virus 4 NOT DETECTED NOT DETECTED Final   Respiratory Syncytial Virus DETECTED (A) NOT DETECTED Final    Comment: CRITICAL RESULT CALLED TO, READ BACK BY AND VERIFIED WITH: RN FELICIA P [phone] [id number] FCP    Bordetella pertussis NOT DETECTED NOT DETECTED Final   Chlamydophila pneumoniae NOT DETECTED NOT DETECTED Final   Mycoplasma pneumoniae NOT DETECTED NOT DETECTED Final    Comment: Performed at [facility], [address] [place], [place] [address]  Novel Coronavirus, NAA (hospital order; send-out to ref lab)     Status: None (Preliminary result)   Collection Time: 15-Aug-2018  1:04 PM  Result Value Ref Range Status   SARS-CoV-2, NAA PENDING NOT DETECTED Incomplete  Blood culture (routine x 2)     Status: None (Preliminary result)   Collection Time: 08/15/2018  2:02 PM  Result Value Ref Range Status   Specimen Description BLOOD RIGHT ANTECUBITAL  Final   Special Requests   Final    BOTTLES DRAWN AEROBIC AND ANAEROBIC Blood Culture results may not be optimal due to an excessive volume of blood received in culture bottles   Culture   Final    NO GROWTH 4 DAYS Performed at [facility], [address], [place], [place] [address]    Report Status PENDING  Incomplete  Blood culture (routine x 2)     Status: None (Preliminary result)   Collection Time: 08-15-2018  2:03 PM  Result Value Ref Range Status   Specimen Description BLOOD LEFT ANTECUBITAL  Final   Special  Requests   Final    BOTTLES DRAWN AEROBIC AND ANAEROBIC Blood Culture adequate volume   Culture   Final    NO GROWTH 4 DAYS Performed at [facility], [address], [place], [place] [address]    Report Status PENDING  Incomplete  Culture, respiratory     Status: None   Collection Time: 15-Aug-2018  3:39 PM  Result Value Ref Range Status   Specimen Description   Final    INDUCED SPUTUM Performed at [facility], [address], [place], [place] [address]    Special Requests   Final    NONE Performed at [facility], [address] [place], [place] [address]    Gram Stain   Final    FEW WBC PRESENT, PREDOMINANTLY PMN ABUNDANT GRAM POSITIVE COCCI IN PAIRS IN CLUSTERS FEW GRAM NEGATIVE RODS    Culture   Final    FEW MORAXELLA CATARRHALIS(BRANHAMELLA) BETA LACTAMASE POSITIVE Performed at [facility], [address] [place], [place] [address]    Report Status 07/25/2018 FINAL  Final  MRSA PCR Screening     Status: None   Collection Time: 08/15/2018 10:59 PM  Result Value Ref Range  Status   MRSA by PCR NEGATIVE NEGATIVE Final    Comment:        The GeneXpert MRSA Assay (FDA approved for NASAL specimens only), is one component of a comprehensive MRSA colonization surveillance program. It is not intended to diagnose MRSA infection nor to guide or monitor treatment for MRSA infections. Performed at [facility], [address] [place], [place] [address]     Lab Basic Metabolic Panel: Recent Labs  Lab 2018/07/31 1403 07/24/18 0540 07/24/18 1524 07/24/18 2156 07/25/18 0430 07/26/18 0518 07/27/18 0644  NA 135 137  --   --  140 141 142  K 5.3* 5.7* 5.4* 5.1 5.1 4.9 4.3  CL 95* 98  --   --  101 101 102  CO2 31 32  --   --  35* 34* 35*  GLUCOSE 161* 146*  --   --  160* 152* 165*  BUN 23 38*  --   --  51* 39* 32*  CREATININE 0.82 0.90  --   --  0.83 0.70 0.56*  CALCIUM 8.4* 8.5*  --   --  8.3* 8.2* 8.2*  MG  --  2.5*  --    --  2.7* 2.4 2.4  PHOS  --  3.9  --   --  2.6 1.4* 1.3*   Liver Function Tests: Recent Labs  Lab July 31, 2018 1403  AST 33  ALT 34  ALKPHOS 68  BILITOT 0.7  PROT 6.9  ALBUMIN 3.2*   No results for input(s): LIPASE, AMYLASE in the last 168 hours. No results for input(s): AMMONIA in the last 168 hours. CBC: Recent Labs  Lab 2018-07-31 1253  07/24/18 0540 07/25/18 0430 07/26/18 0518 07/27/18 0644 07/31/2018 0555  WBC 15.3*   < > 19.5* 19.3* 17.6* 16.1* 13.7*  NEUTROABS 14.4*  --   --   --   --   --   --   HGB 7.9*   < > 11.4* 11.1* 11.0* 10.6* 11.6*  HCT 26.3*   < > 38.0* 36.4* 37.9* 36.4* 41.5  MCV 98.9   < > 97.7 97.1 101.9* 98.9 105.3*  PLT 198   < > 273 275 248 229 181   < > = values in this interval not displayed.   Cardiac Enzymes: Recent Labs  Lab 2018/07/31 1403  TROPONINI 0.12*   Sepsis Labs: Recent Labs  Lab 2018/07/31 1402 07/31/2018 1632  07/25/18 0430 07/26/18 0518 07/27/18 0644 07/27/2018 0555  PROCALCITON  --  <0.10  --  <0.10  --   --   --   WBC  --   --    < > 19.3* 17.6* 16.1* 13.7*  LATICACIDVEN 1.3  --   --   --   --   --   --    < > = values in this interval not displayed.     Erin Fulling 07/27/2018, 7:19 AM

## 2018-08-02 NOTE — Progress Notes (Signed)
Pt developed worsening increased work of breathing and hypoxia.  His O2 requirements have increased to FiO2 @100 % and PEEP @15 , however he remains hypoxic with O2 sats 86%.  Pts wife arrived at hospital and stated she did not want the pt to suffer any longer.  She also stated she will have a discussion with her son to determine the goals of treatment for the pt.  All questions were answered will continue to monitor and assess pt.  Sonda Rumble, AGNP  Pulmonary/Critical Care Pager [phone] (please enter 7 digits) PCCM Consult Pager [phone] (please enter 7 digits)

## 2018-08-02 NOTE — Progress Notes (Signed)
Pt pronounced 0711 by Debbe Bales, RN; Marcha Solders, RN.

## 2018-08-02 DEATH — deceased

## 2018-08-03 ENCOUNTER — Telehealth: Payer: Self-pay

## 2018-08-03 NOTE — Telephone Encounter (Signed)
Death certificate received and completed by Dr. Belia Heman. Mailed to [facility] Department.

## 2018-08-28 ENCOUNTER — Ambulatory Visit: Payer: Self-pay | Admitting: Family Medicine

## 2018-09-14 ENCOUNTER — Ambulatory Visit: Payer: Self-pay
